# Patient Record
Sex: Female | Born: 1990 | Race: White | Hispanic: No | Marital: Married | Smoking: Former smoker
Health system: Southern US, Community
[De-identification: ages and names within clinical notes are randomized; demographics above are authoritative.]

## PROBLEM LIST (undated history)

## (undated) DIAGNOSIS — F32A Depression, unspecified: Secondary | ICD-10-CM

## (undated) DIAGNOSIS — R011 Cardiac murmur, unspecified: Secondary | ICD-10-CM

## (undated) DIAGNOSIS — R569 Unspecified convulsions: Secondary | ICD-10-CM

## (undated) DIAGNOSIS — F419 Anxiety disorder, unspecified: Secondary | ICD-10-CM

## (undated) DIAGNOSIS — K219 Gastro-esophageal reflux disease without esophagitis: Secondary | ICD-10-CM

## (undated) DIAGNOSIS — F1211 Cannabis abuse, in remission: Secondary | ICD-10-CM

## (undated) DIAGNOSIS — R51 Headache: Secondary | ICD-10-CM

## (undated) DIAGNOSIS — E559 Vitamin D deficiency, unspecified: Secondary | ICD-10-CM

## (undated) DIAGNOSIS — F329 Major depressive disorder, single episode, unspecified: Secondary | ICD-10-CM

## (undated) DIAGNOSIS — R519 Headache, unspecified: Secondary | ICD-10-CM

## (undated) DIAGNOSIS — F1191 Opioid use, unspecified, in remission: Secondary | ICD-10-CM

## (undated) DIAGNOSIS — Z720 Tobacco use: Secondary | ICD-10-CM

## (undated) HISTORY — DX: Depression, unspecified: F32.A

## (undated) HISTORY — DX: Major depressive disorder, single episode, unspecified: F32.9

## (undated) HISTORY — DX: Anxiety disorder, unspecified: F41.9

## (undated) HISTORY — DX: Vitamin D deficiency, unspecified: E55.9

## (undated) HISTORY — DX: Tobacco use: Z72.0

## (undated) HISTORY — DX: Cannabis abuse, in remission: F12.11

---
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
## 2011-07-30 HISTORY — PX: WISDOM TOOTH EXTRACTION: SHX21

## 2011-07-30 HISTORY — PX: COLON SURGERY: SHX602

## 2014-01-12 ENCOUNTER — Emergency Department: Payer: Self-pay | Admitting: Internal Medicine

## 2014-01-22 ENCOUNTER — Emergency Department: Payer: Self-pay | Admitting: Emergency Medicine

## 2014-01-31 ENCOUNTER — Emergency Department: Payer: Self-pay | Admitting: Emergency Medicine

## 2014-04-14 LAB — HM PAP SMEAR: HM Pap smear: NEGATIVE

## 2014-04-26 ENCOUNTER — Emergency Department: Payer: Self-pay | Admitting: Emergency Medicine

## 2014-04-26 LAB — COMPREHENSIVE METABOLIC PANEL
ALK PHOS: 44 U/L — AB
Albumin: 3.7 g/dL (ref 3.4–5.0)
Anion Gap: 8 (ref 7–16)
BILIRUBIN TOTAL: 0.4 mg/dL (ref 0.2–1.0)
BUN: 7 mg/dL (ref 7–18)
CALCIUM: 8.7 mg/dL (ref 8.5–10.1)
CHLORIDE: 102 mmol/L (ref 98–107)
Co2: 27 mmol/L (ref 21–32)
Creatinine: 0.62 mg/dL (ref 0.60–1.30)
Glucose: 87 mg/dL (ref 65–99)
Osmolality: 271 (ref 275–301)
Potassium: 3.4 mmol/L — ABNORMAL LOW (ref 3.5–5.1)
SGOT(AST): 21 U/L (ref 15–37)
SGPT (ALT): 26 U/L
Sodium: 137 mmol/L (ref 136–145)
Total Protein: 7.8 g/dL (ref 6.4–8.2)

## 2014-04-26 LAB — URINALYSIS, COMPLETE
BILIRUBIN, UR: NEGATIVE
BLOOD: NEGATIVE
Glucose,UR: NEGATIVE mg/dL (ref 0–75)
Leukocyte Esterase: NEGATIVE
Nitrite: NEGATIVE
PH: 6 (ref 4.5–8.0)
Protein: 30
RBC,UR: 2 /HPF (ref 0–5)
SPECIFIC GRAVITY: 1.028 (ref 1.003–1.030)
WBC UR: 2 /HPF (ref 0–5)

## 2014-04-26 LAB — CBC WITH DIFFERENTIAL/PLATELET
BASOS ABS: 0.1 10*3/uL (ref 0.0–0.1)
Basophil %: 0.5 %
EOS ABS: 0.1 10*3/uL (ref 0.0–0.7)
EOS PCT: 0.6 %
HCT: 42.4 % (ref 35.0–47.0)
HGB: 13.7 g/dL (ref 12.0–16.0)
LYMPHS ABS: 1.9 10*3/uL (ref 1.0–3.6)
Lymphocyte %: 14.1 %
MCH: 33.3 pg (ref 26.0–34.0)
MCHC: 32.2 g/dL (ref 32.0–36.0)
MCV: 103 fL — AB (ref 80–100)
MONO ABS: 1 x10 3/mm — AB (ref 0.2–0.9)
Monocyte %: 7.4 %
NEUTROS ABS: 10.2 10*3/uL — AB (ref 1.4–6.5)
Neutrophil %: 77.4 %
Platelet: 292 10*3/uL (ref 150–440)
RBC: 4.11 10*6/uL (ref 3.80–5.20)
RDW: 12.3 % (ref 11.5–14.5)
WBC: 13.1 10*3/uL — ABNORMAL HIGH (ref 3.6–11.0)

## 2014-04-26 LAB — WET PREP, GENITAL

## 2014-04-27 LAB — GC/CHLAMYDIA PROBE AMP

## 2014-04-27 LAB — HCG, QUANTITATIVE, PREGNANCY: Beta Hcg, Quant.: 67296 m[IU]/mL — ABNORMAL HIGH

## 2014-05-11 ENCOUNTER — Emergency Department: Payer: Self-pay | Admitting: Emergency Medicine

## 2014-05-11 LAB — GC/CHLAMYDIA PROBE AMP

## 2014-05-11 LAB — CBC WITH DIFFERENTIAL/PLATELET
BASOS ABS: 0.1 10*3/uL (ref 0.0–0.1)
Basophil %: 0.4 %
Eosinophil #: 0.1 10*3/uL (ref 0.0–0.7)
Eosinophil %: 0.5 %
HCT: 40.6 % (ref 35.0–47.0)
HGB: 13.2 g/dL (ref 12.0–16.0)
LYMPHS PCT: 13 %
Lymphocyte #: 1.7 10*3/uL (ref 1.0–3.6)
MCH: 33.9 pg (ref 26.0–34.0)
MCHC: 32.5 g/dL (ref 32.0–36.0)
MCV: 104 fL — ABNORMAL HIGH (ref 80–100)
MONO ABS: 0.9 x10 3/mm (ref 0.2–0.9)
Monocyte %: 6.9 %
NEUTROS ABS: 10.1 10*3/uL — AB (ref 1.4–6.5)
Neutrophil %: 79.2 %
PLATELETS: 300 10*3/uL (ref 150–440)
RBC: 3.9 10*6/uL (ref 3.80–5.20)
RDW: 12.6 % (ref 11.5–14.5)
WBC: 12.8 10*3/uL — AB (ref 3.6–11.0)

## 2014-05-11 LAB — COMPREHENSIVE METABOLIC PANEL
ALBUMIN: 3.5 g/dL (ref 3.4–5.0)
ALK PHOS: 45 U/L — AB
ANION GAP: 8 (ref 7–16)
BUN: 5 mg/dL — AB (ref 7–18)
Bilirubin,Total: 0.5 mg/dL (ref 0.2–1.0)
Calcium, Total: 8.8 mg/dL (ref 8.5–10.1)
Chloride: 104 mmol/L (ref 98–107)
Co2: 25 mmol/L (ref 21–32)
Creatinine: 0.63 mg/dL (ref 0.60–1.30)
Glucose: 80 mg/dL (ref 65–99)
Osmolality: 270 (ref 275–301)
Potassium: 3.8 mmol/L (ref 3.5–5.1)
SGOT(AST): 14 U/L — ABNORMAL LOW (ref 15–37)
SGPT (ALT): 27 U/L
Sodium: 137 mmol/L (ref 136–145)
TOTAL PROTEIN: 7.6 g/dL (ref 6.4–8.2)

## 2014-05-11 LAB — URINALYSIS, COMPLETE
BILIRUBIN, UR: NEGATIVE
Blood: NEGATIVE
GLUCOSE, UR: NEGATIVE mg/dL (ref 0–75)
LEUKOCYTE ESTERASE: NEGATIVE
NITRITE: NEGATIVE
Ph: 7 (ref 4.5–8.0)
Protein: NEGATIVE
Specific Gravity: 1.02 (ref 1.003–1.030)
Squamous Epithelial: 2
WBC UR: NONE SEEN /HPF (ref 0–5)

## 2014-05-11 LAB — HCG, QUANTITATIVE, PREGNANCY: Beta Hcg, Quant.: 48523 m[IU]/mL — ABNORMAL HIGH

## 2014-05-11 LAB — WET PREP, GENITAL

## 2014-06-16 ENCOUNTER — Emergency Department: Payer: Self-pay | Admitting: Emergency Medicine

## 2014-06-16 LAB — URINALYSIS, COMPLETE
BACTERIA: NONE SEEN
BILIRUBIN, UR: NEGATIVE
Blood: NEGATIVE
GLUCOSE, UR: NEGATIVE mg/dL (ref 0–75)
KETONE: NEGATIVE
Leukocyte Esterase: NEGATIVE
Nitrite: NEGATIVE
Ph: 8 (ref 4.5–8.0)
Protein: NEGATIVE
RBC,UR: NONE SEEN /HPF (ref 0–5)
SPECIFIC GRAVITY: 1.009 (ref 1.003–1.030)
WBC UR: <1 /HPF (ref 0–5)

## 2014-06-16 LAB — CBC WITH DIFFERENTIAL/PLATELET
Basophil #: 0 10*3/uL (ref 0.0–0.1)
Basophil %: 0.3 %
Eosinophil #: 0.1 10*3/uL (ref 0.0–0.7)
Eosinophil %: 0.9 %
HCT: 35.2 % (ref 35.0–47.0)
HGB: 11.9 g/dL — ABNORMAL LOW (ref 12.0–16.0)
Lymphocyte #: 1.5 10*3/uL (ref 1.0–3.6)
Lymphocyte %: 17.8 %
MCH: 35.4 pg — ABNORMAL HIGH (ref 26.0–34.0)
MCHC: 33.6 g/dL (ref 32.0–36.0)
MCV: 105 fL — ABNORMAL HIGH (ref 80–100)
Monocyte #: 0.7 x10 3/mm (ref 0.2–0.9)
Monocyte %: 8.2 %
Neutrophil #: 6.3 10*3/uL (ref 1.4–6.5)
Neutrophil %: 72.8 %
Platelet: 232 10*3/uL (ref 150–440)
RBC: 3.35 10*6/uL — ABNORMAL LOW (ref 3.80–5.20)
RDW: 12.7 % (ref 11.5–14.5)
WBC: 8.6 10*3/uL (ref 3.6–11.0)

## 2014-06-16 LAB — HEPATIC FUNCTION PANEL A (ARMC)
ALK PHOS: 42 U/L — AB
ALT: 32 U/L
Albumin: 2.9 g/dL — ABNORMAL LOW (ref 3.4–5.0)
BILIRUBIN TOTAL: 0.3 mg/dL (ref 0.2–1.0)
SGOT(AST): 27 U/L (ref 15–37)
Total Protein: 6.4 g/dL (ref 6.4–8.2)

## 2014-06-16 LAB — BASIC METABOLIC PANEL
Anion Gap: 6 — ABNORMAL LOW (ref 7–16)
BUN: 5 mg/dL — ABNORMAL LOW (ref 7–18)
Calcium, Total: 8 mg/dL — ABNORMAL LOW (ref 8.5–10.1)
Chloride: 107 mmol/L (ref 98–107)
Co2: 26 mmol/L (ref 21–32)
Creatinine: 0.55 mg/dL — ABNORMAL LOW (ref 0.60–1.30)
EGFR (African American): >60
EGFR (Non-African Amer.): >60
Glucose: 85 mg/dL (ref 65–99)
Osmolality: 274 (ref 275–301)
Potassium: 3.6 mmol/L (ref 3.5–5.1)
Sodium: 139 mmol/L (ref 136–145)

## 2014-06-16 LAB — HCG, QUANTITATIVE, PREGNANCY: Beta Hcg, Quant.: 7680 m[IU]/mL — ABNORMAL HIGH

## 2014-09-08 ENCOUNTER — Observation Stay: Payer: Self-pay | Admitting: Obstetrics and Gynecology

## 2014-09-20 ENCOUNTER — Observation Stay: Payer: Self-pay | Admitting: Obstetrics and Gynecology

## 2014-11-01 ENCOUNTER — Ambulatory Visit
Admit: 2014-11-01 | Disposition: A | Discharge status: Home / Self Care | Payer: Self-pay | Attending: Obstetrics and Gynecology | Admitting: Obstetrics and Gynecology

## 2014-11-08 ENCOUNTER — Observation Stay
Admit: 2014-11-08 | Disposition: A | Discharge status: Home / Self Care | Payer: Self-pay | Attending: Obstetrics and Gynecology | Admitting: Obstetrics and Gynecology

## 2014-11-11 ENCOUNTER — Inpatient Hospital Stay
Admit: 2014-11-11 | Disposition: A | Discharge status: Home / Self Care | Payer: Self-pay | Attending: Obstetrics and Gynecology | Admitting: Obstetrics and Gynecology

## 2014-11-11 LAB — CBC WITH DIFFERENTIAL/PLATELET
BASOS ABS: 0.1 10*3/uL (ref 0.0–0.1)
Basophil %: 0.3 %
EOS ABS: 0.1 10*3/uL (ref 0.0–0.7)
Eosinophil %: 0.4 %
HCT: 35 % (ref 35.0–47.0)
HGB: 11.5 g/dL — ABNORMAL LOW (ref 12.0–16.0)
LYMPHS ABS: 1.7 10*3/uL (ref 1.0–3.6)
Lymphocyte %: 10 %
MCH: 32.8 pg (ref 26.0–34.0)
MCHC: 32.9 g/dL (ref 32.0–36.0)
MCV: 100 fL (ref 80–100)
MONO ABS: 1.3 x10 3/mm — AB (ref 0.2–0.9)
Monocyte %: 8 %
NEUTROS PCT: 81.3 %
Neutrophil #: 13.7 10*3/uL — ABNORMAL HIGH (ref 1.4–6.5)
PLATELETS: 298 10*3/uL (ref 150–440)
RBC: 3.51 10*6/uL — ABNORMAL LOW (ref 3.80–5.20)
RDW: 13.2 % (ref 11.5–14.5)
WBC: 16.9 10*3/uL — ABNORMAL HIGH (ref 3.6–11.0)

## 2014-11-12 LAB — DRUG SCREEN, URINE
AMPHETAMINES, UR SCREEN: NEGATIVE
BENZODIAZEPINE, UR SCRN: NEGATIVE
Barbiturates, Ur Screen: NEGATIVE
Cannabinoid 50 Ng, Ur ~~LOC~~: POSITIVE
Cocaine Metabolite,Ur ~~LOC~~: NEGATIVE
MDMA (Ecstasy)Ur Screen: NEGATIVE
Methadone, Ur Screen: NEGATIVE
OPIATE, UR SCREEN: NEGATIVE
PHENCYCLIDINE (PCP) UR S: NEGATIVE
Tricyclic, Ur Screen: NEGATIVE

## 2014-11-12 LAB — GC/CHLAMYDIA PROBE AMP

## 2014-11-13 LAB — HEMATOCRIT: HCT: 29.1 % — ABNORMAL LOW (ref 35.0–47.0)

## 2014-12-06 NOTE — H&P (Signed)
L&D Evaluation:  History:  HPI [name] is a 24 y.o. G2P0010 at 40.1 weeks, EDD 11/10/14, by LMP 02/03/14 who presents with c/o LOF and contractions since 1:30 pm. Denies vaginal bleeding and notes good FM.   Presents with contractions, leaking fluid   Patient's Medical History Depression, Obesity in pregnancy, h/o Chlamydia and gonorrhea in early pregnancy, endometriosis, Rh neg, GBS+. SAB  x 1   Patient's Surgical History none   Medications Pre Natal Vitamins  prozac 20 mg daily (weaned off in 3rd trimster)   Allergies PCN   Social History marijuana use, former smoker (quit 02/2014)   Family History Diabetes in MGF   ROS:  General normal   HEENT normal   CNS normal   GI normal   GU leaking fluid, contractions   Resp normal   CV normal   Renal normal   MS normal   Exam:  Vital Signs stable   Urine Protein not completed   General no apparent distress   Mental Status clear   Chest clear   Heart normal sinus rhythm   Abdomen gravid, non-tender   Estimated Fetal Weight Large for gestational age, ~ 4000 grams.  (EFW 3537 g at 36.5 weeks, 93%ile)   Fetal Position cephalic   Back no CVAT   Edema no edema   Pelvic no external lesions, 3/90/-2/cephalic   Mebranes Ruptured, nitrazine positive   Description clear   FHT normal rate with no decels   Fetal Heart Rate 145   Ucx regular   Ucx Frequency 3 min   Skin dry, no lesions, no rashes   Lymph no lymphadenopathy   Other A-/-/ND/NR/VZI/Rubella unknown.  03/31/14 GC+/CL+ (treated.  Repeat 10/18/14 neg). RGS wnl (67 mg/dL). GBS + culture. 08/23/14: Hgb/Hct 11.4/32.9 11/11/14: CBC pending   Impression:  Impression early labor, with SROM   Plan:  Plan EFM/NST, monitor contractions and for cervical change, antibiotics for GBBS prophylaxis   Comments Will use Clindamycin for GBP prophylaxis, patient with PCN allg.  Desires epidural at some point in labor. Can use Stadol until epidural  desired. Will need Rhogam postpartum pending baby status.  Rubella status unknown. Will order.   Electronic Signatures: Fabian November (MD)  (Signed 17-Apr-16 14:07)  Authored: L&D Evaluation   Last Updated: 17-Apr-16 14:07 by Fabian November (MD)

## 2015-01-19 ENCOUNTER — Telehealth: Payer: Self-pay | Admitting: Obstetrics and Gynecology

## 2015-01-19 ENCOUNTER — Ambulatory Visit: Payer: Self-pay | Admitting: Obstetrics and Gynecology

## 2015-01-19 ENCOUNTER — Emergency Department
Admission: EM | Admit: 2015-01-19 | Discharge: 2015-01-19 | Discharge status: Against Medical Advice | Payer: Medicaid Other | Attending: Emergency Medicine | Admitting: Emergency Medicine

## 2015-01-19 ENCOUNTER — Encounter: Payer: Self-pay | Admitting: Emergency Medicine

## 2015-01-19 DIAGNOSIS — R109 Unspecified abdominal pain: Secondary | ICD-10-CM | POA: Diagnosis not present

## 2015-01-19 DIAGNOSIS — Z720 Tobacco use: Secondary | ICD-10-CM | POA: Diagnosis not present

## 2015-01-19 DIAGNOSIS — F32A Depression, unspecified: Secondary | ICD-10-CM | POA: Insufficient documentation

## 2015-01-19 DIAGNOSIS — F419 Anxiety disorder, unspecified: Principal | ICD-10-CM

## 2015-01-19 DIAGNOSIS — F1221 Cannabis dependence, in remission: Secondary | ICD-10-CM

## 2015-01-19 DIAGNOSIS — F329 Major depressive disorder, single episode, unspecified: Secondary | ICD-10-CM | POA: Insufficient documentation

## 2015-01-19 LAB — URINALYSIS COMPLETE WITH MICROSCOPIC (ARMC ONLY)
Bilirubin Urine: NEGATIVE
Glucose, UA: NEGATIVE mg/dL
Ketones, ur: NEGATIVE mg/dL
LEUKOCYTES UA: NEGATIVE
Nitrite: NEGATIVE
Protein, ur: NEGATIVE mg/dL
Specific Gravity, Urine: 1.021 (ref 1.005–1.030)
pH: 7 (ref 5.0–8.0)

## 2015-01-19 LAB — COMPREHENSIVE METABOLIC PANEL
ALBUMIN: 4 g/dL (ref 3.5–5.0)
ALT: 35 U/L (ref 14–54)
AST: 28 U/L (ref 15–41)
Alkaline Phosphatase: 82 U/L (ref 38–126)
Anion gap: 8 (ref 5–15)
BILIRUBIN TOTAL: 0.3 mg/dL (ref 0.3–1.2)
BUN: 6 mg/dL (ref 6–20)
CALCIUM: 9.4 mg/dL (ref 8.9–10.3)
CO2: 28 mmol/L (ref 22–32)
Chloride: 106 mmol/L (ref 101–111)
Creatinine, Ser: 0.85 mg/dL (ref 0.44–1.00)
GFR calc Af Amer: >60 mL/min (ref >60)
GFR calc non Af Amer: >60 mL/min (ref >60)
Glucose, Bld: 93 mg/dL (ref 65–99)
Potassium: 4.2 mmol/L (ref 3.5–5.1)
SODIUM: 142 mmol/L (ref 135–145)
Total Protein: 8 g/dL (ref 6.5–8.1)

## 2015-01-19 LAB — CBC WITH DIFFERENTIAL/PLATELET
BASOS ABS: 0.1 10*3/uL (ref 0–0.1)
Basophils Relative: 1 %
Eosinophils Absolute: 0.3 10*3/uL (ref 0–0.7)
Eosinophils Relative: 5 %
HCT: 40.6 % (ref 35.0–47.0)
Hemoglobin: 13.4 g/dL (ref 12.0–16.0)
Lymphocytes Relative: 27 %
Lymphs Abs: 1.9 10*3/uL (ref 1.0–3.6)
MCH: 32.2 pg (ref 26.0–34.0)
MCHC: 33 g/dL (ref 32.0–36.0)
MCV: 97.5 fL (ref 80.0–100.0)
Monocytes Absolute: 0.6 10*3/uL (ref 0.2–0.9)
Monocytes Relative: 9 %
NEUTROS PCT: 58 %
Neutro Abs: 4.2 10*3/uL (ref 1.4–6.5)
PLATELETS: 314 10*3/uL (ref 150–440)
RBC: 4.16 MIL/uL (ref 3.80–5.20)
RDW: 13.9 % (ref 11.5–14.5)
WBC: 7.1 10*3/uL (ref 3.6–11.0)

## 2015-01-19 LAB — LIPASE, BLOOD: Lipase: 42 U/L (ref 22–51)

## 2015-01-19 LAB — POCT PREGNANCY, URINE: Preg Test, Ur: NEGATIVE

## 2015-01-19 NOTE — ED Notes (Signed)
Pt reports that she had to leave because she needed to take care of her baby

## 2015-01-19 NOTE — Telephone Encounter (Signed)
Patients mother called concerrned about patients well being. Her mother stated she was taking the meds prescribed for depression 4 times a time and has ran out and is out of her mind crazy in her words. Patient had an appointment this morning and did not show up. Her Mother is wanting her to be seen for an appointment. She was informed that you are out of the office tomorrow. Her mother also stated that the patient doesn't want anything to do with the baby and is concerned to the point that she is thinking about calling DSS.

## 2015-01-19 NOTE — ED Notes (Signed)
RUQ pain for several days

## 2015-01-19 NOTE — Telephone Encounter (Signed)
Patient's mother informed multiple times to either have patient taken to the Emergency Room for evaluation, or to call 911 for emergency services.  Discussed that the she can be placed on an involuntary hold for 24 hrs at a behavioral health facility if deemed necessary by authorities.  Should not wait to schedule an appointment if patient is unstable, and her primary OB will be out of the office tomorrow.  Also given telephone numbers for behavioral health 24 hr hotline.

## 2015-01-20 ENCOUNTER — Encounter: Payer: Self-pay | Admitting: *Deleted

## 2015-01-20 ENCOUNTER — Emergency Department
Admission: EM | Admit: 2015-01-20 | Discharge: 2015-01-20 | Disposition: A | Discharge status: Home / Self Care | Payer: Medicaid Other | Attending: Emergency Medicine | Admitting: Emergency Medicine

## 2015-01-20 DIAGNOSIS — F53 Postpartum depression: Secondary | ICD-10-CM

## 2015-01-20 DIAGNOSIS — O99345 Other mental disorders complicating the puerperium: Secondary | ICD-10-CM

## 2015-01-20 DIAGNOSIS — F329 Major depressive disorder, single episode, unspecified: Secondary | ICD-10-CM | POA: Diagnosis not present

## 2015-01-20 DIAGNOSIS — Z720 Tobacco use: Secondary | ICD-10-CM | POA: Diagnosis not present

## 2015-01-20 DIAGNOSIS — Z008 Encounter for other general examination: Secondary | ICD-10-CM | POA: Diagnosis present

## 2015-01-20 DIAGNOSIS — Z880 Allergy status to penicillin: Secondary | ICD-10-CM | POA: Insufficient documentation

## 2015-01-20 LAB — URINALYSIS COMPLETE WITH MICROSCOPIC (ARMC ONLY)
Bacteria, UA: NONE SEEN
Bilirubin Urine: NEGATIVE
Glucose, UA: NEGATIVE mg/dL
HGB URINE DIPSTICK: NEGATIVE
Leukocytes, UA: NEGATIVE
NITRITE: NEGATIVE
PH: 6 (ref 5.0–8.0)
PROTEIN: 30 mg/dL — AB
SPECIFIC GRAVITY, URINE: 1.027 (ref 1.005–1.030)

## 2015-01-20 LAB — URINE DRUG SCREEN, QUALITATIVE (ARMC ONLY)
Amphetamines, Ur Screen: NOT DETECTED
Barbiturates, Ur Screen: NOT DETECTED
Benzodiazepine, Ur Scrn: NOT DETECTED
COCAINE METABOLITE, UR ~~LOC~~: NOT DETECTED
Cannabinoid 50 Ng, Ur ~~LOC~~: POSITIVE — AB
MDMA (ECSTASY) UR SCREEN: NOT DETECTED
Methadone Scn, Ur: NOT DETECTED
Opiate, Ur Screen: NOT DETECTED
Phencyclidine (PCP) Ur S: NOT DETECTED
Tricyclic, Ur Screen: NOT DETECTED

## 2015-01-20 MED ORDER — SERTRALINE HCL 50 MG PO TABS: 50.0000 mg | ORAL_TABLET | Freq: Every day | ORAL | Status: DC

## 2015-01-20 NOTE — Discharge Instructions (Signed)
You appear to have good insight to your depression and mood swings. You have no suicidal thoughts or thoughts of hurting others, including your baby. You have good family support. Please follow-up with [facility]. He may restart Zoloft at one tablet per day. The prescription provided today with last 2 weeks.  Return to the emergency department if you have any thoughts of self-harm or harm towards others, or if you have any other urgent concerns.  Postpartum Depression and Baby Blues The postpartum period begins right after the birth of a baby. During this time, there is often a great amount of joy and excitement. It is also a time of many changes in the life of the parents. Regardless of how many times a mother gives birth, each child brings new challenges and dynamics to the family. It is not unusual to have feelings of excitement along with confusing shifts in moods, emotions, and thoughts. All mothers are at risk of developing postpartum depression or the "baby blues." These mood changes can occur right after giving birth, or they may occur many months after giving birth. The baby blues or postpartum depression can be mild or severe. Additionally, postpartum depression can go away rather quickly, or it can be a long-term condition.  CAUSES Raised hormone levels and the rapid drop in those levels are thought to be a main cause of postpartum depression and the baby blues. A number of hormones change during and after pregnancy. Estrogen and progesterone usually decrease right after the delivery of your baby. The levels of thyroid hormone and various cortisol steroids also rapidly drop. Other factors that play a role in these mood changes include major life events and genetics.  RISK FACTORS If you have any of the following risks for the baby blues or postpartum depression, know what symptoms to watch out for during the postpartum period. Risk factors that may increase the likelihood of getting the baby blues or  postpartum depression include:  Having a personal or family history of depression.   Having depression while being pregnant.   Having premenstrual mood issues or mood issues related to oral contraceptives.  Having a lot of life stress.   Having marital conflict.   Lacking a social support network.   Having a baby with special needs.   Having health problems, such as diabetes.  SIGNS AND SYMPTOMS Symptoms of baby blues include:  Brief changes in mood, such as going from extreme happiness to sadness.  Decreased concentration.   Difficulty sleeping.   Crying spells, tearfulness.   Irritability.   Anxiety.  Symptoms of postpartum depression typically begin within the first month after giving birth. These symptoms include:  Difficulty sleeping or excessive sleepiness.   Marked weight loss.   Agitation.   Feelings of worthlessness.   Lack of interest in activity or food.  Postpartum psychosis is a very serious condition and can be dangerous. Fortunately, it is rare. Displaying any of the following symptoms is cause for immediate medical attention. Symptoms of postpartum psychosis include:   Hallucinations and delusions.   Bizarre or disorganized behavior.   Confusion or disorientation.  DIAGNOSIS  A diagnosis is made by an evaluation of your symptoms. There are no medical or lab tests that lead to a diagnosis, but there are various questionnaires that a health care provider may use to identify those with the baby blues, postpartum depression, or psychosis. Often, a screening tool called the [place] Postnatal Depression Scale is used to diagnose depression in the postpartum period.  TREATMENT The baby blues usually goes away on its own in 1-2 weeks. Social support is often all that is needed. You will be encouraged to get adequate sleep and rest. Occasionally, you may be given medicines to help you sleep.  Postpartum depression requires treatment  because it can last several months or longer if it is not treated. Treatment may include individual or group therapy, medicine, or both to address any social, physiological, and psychological factors that may play a role in the depression. Regular exercise, a healthy diet, rest, and social support may also be strongly recommended.  Postpartum psychosis is more serious and needs treatment right away. Hospitalization is often needed. HOME CARE INSTRUCTIONS  Get as much rest as you can. Nap when the baby sleeps.   Exercise regularly. Some women find yoga and walking to be beneficial.   Eat a balanced and nourishing diet.   Do little things that you enjoy. Have a cup of tea, take a bubble bath, read your favorite magazine, or listen to your favorite music.  Avoid alcohol.   Ask for help with household chores, cooking, grocery shopping, or running errands as needed. Do not try to do everything.   Talk to people close to you about how you are feeling. Get support from your partner, family members, friends, or other new moms.  Try to stay positive in how you think. Think about the things you are grateful for.   Do not spend a lot of time alone.   Only take over-the-counter or prescription medicine as directed by your health care provider.  Keep all your postpartum appointments.   Let your health care provider know if you have any concerns.  SEEK MEDICAL CARE IF: You are having a reaction to or problems with your medicine. SEEK IMMEDIATE MEDICAL CARE IF:  You have suicidal feelings.   You think you may harm the baby or someone else. MAKE SURE YOU:  Understand these instructions.  Will watch your condition.  Will get help right away if you are not doing well or get worse. Document Released: 04/18/2004 Document Revised: 07/20/2013 Document Reviewed: 04/26/2013 [facility] [employer] Patient Information 2015 [place], [place]. This information is not intended to replace advice given to you  by your health care provider. Make sure you discuss any questions you have with your health care provider.

## 2015-01-20 NOTE — ED Notes (Signed)

## 2015-01-20 NOTE — ED Notes (Signed)
Pt is 3 months post partum and states that she has depression and mood swings and she feels that it is "really bad".  Pt denies any thoughts of harming herself, her baby or anyone else but feels that her "mood swings are affecting her relationship" and also anxiety and sadness about her fear of her boyfriend leaving. Pt is calm and cooperative

## 2015-01-20 NOTE — ED Provider Notes (Signed)
[facility] Emergency Department Provider Note  ____________________________________________  Time seen: 1515  I have reviewed the triage vital signs and the nursing notes.   HISTORY  Chief Complaint Medical Clearance  depression, postpartum    HPI Kendra [name] is a 24 y.o. female who had a child 3 months ago. Vaginal delivery, no significant complications. She presents today reporting that she has had postpartum depression. This began 2 weeks after the delivery. She has had some mood swings and is arguing more with her boyfriend. She has seen her obstetrician about this. She was started on Zoloft for this condition at her 6 week postpartum checkup. She escalated the dose quicker than what was advised and has been taking 2 pills a day. She has now run out of the medicine one week ago. Since then she feels she is arguing with her boyfriend even more. She presents to the emergency department with her mother supporting her and present in the room. She denies any suicidal thoughts or thoughts about hurting others, including the baby. She is feeling that the mood swings and depression is affecting her relationship with her boyfriend and is at threat and thus she is seeking additional care now.     Past Medical History  Diagnosis Date  . Anxiety   . Depression   . Tobacco abuse   . History of cannabis abuse     Patient Active Problem List   Diagnosis Date Noted  . Anxiety and depression 01/19/2015  . History of cannabis dependence/abuse 01/19/2015  . Tobacco abuse 01/19/2015    Past Surgical History  Procedure Laterality Date  . Colon surgery  2013  . Wisdom tooth extraction  2013    Current Outpatient Rx  Name  Route  Sig  Dispense  Refill  . sertraline (ZOLOFT) 50 MG tablet   Oral   Take 1 tablet (50 mg total) by mouth daily.   14 tablet   0     Allergies Penicillins  Family History  Problem Relation Age of Onset  . Diabetes Maternal  Grandfather     Social History History  Substance Use Topics  . Smoking status: Current Every Day Smoker -- 1.00 packs/day    Types: Cigarettes  . Smokeless tobacco: Not on file  . Alcohol Use: No    Review of Systems  Constitutional: Negative for fever. ENT: Negative for sore throat. Cardiovascular: Negative for chest pain. Respiratory: Negative for shortness of breath. Gastrointestinal: Patient presents to the emergency department yesterday with right-sided abdominal pain. It occurred yesterday only and has resolved. Genitourinary: Negative for dysuria. Musculoskeletal: No myalgias or injuries. Skin: Negative for rash. Neurological: Negative for headaches Psychiatric: Depression, mood swings. See history of present illness  10-point ROS otherwise negative.  ____________________________________________   PHYSICAL EXAM:  VITAL SIGNS: ED Triage Vitals  Enc Vitals Group     BP 01/20/15 1445 149/102 mmHg     Pulse Rate 01/20/15 1445 79     Resp 01/20/15 1445 20     Temp 01/20/15 1445 98.2 F (36.8 C)     Temp Source 01/20/15 1445 Oral     SpO2 01/20/15 1445 98 %     Weight 01/20/15 1445 203 lb (92.08 kg)     Height 01/20/15 1445  (1.702 m)     Head Cir --      Peak Flow --      Pain Score 01/20/15 1447 0     Pain Loc --  Pain Edu? --      Excl. in GC? --     Constitutional: Alert and oriented. Well appearing and in no distress. ENT   Head: Normocephalic and atraumatic.   Nose: No congestion/rhinnorhea.   Mouth/Throat: Mucous membranes are moist.  Note: Multiple facial piercings and dilation of both earlobes. Cardiovascular: Normal rate, regular rhythm, no murmur noted Respiratory:  Normal respiratory effort, no tachypnea.    Breath sounds are clear and equal bilaterally.  Gastrointestinal: Soft and nontender. No distention. In particular, no tenderness in the right abdomen where she had pain yesterday. No CVA tenderness. Back: No muscle  spasm, no tenderness. Musculoskeletal: No deformity noted. Nontender with normal range of motion in all extremities.  No noted edema. Neurologic:  Normal speech and language. No gross focal neurologic deficits are appreciated.  Skin:  Skin is warm, dry. No rash noted. Psychiatric: Pleasant, communicative, intact thought process. No suicidal thought, no thoughts of hurting others including her child. Mood swings reported but none noted during our interview. ____________________________________________    LABS (pertinent positives/negatives)  Labs from yesterday's visit were reviewed. Normal white blood cell count and hemoglobin, normal metabolic panel.  ____________________________________________   INITIAL IMPRESSION / ASSESSMENT AND PLAN / ED COURSE  Pleasant, communicative 24 year old female with good family support and access to additional resources. No suicidal thoughts, no urgent psychiatric need identified.   Without any urgent need for threat, this patient is best served by being released from the emergency department and pursuing outpatient treatment. I discussed this with her and her mother and they are comfortable with this plan. I have called and spoke with Dr. Toni Amend, psychiatry, and he agrees with the plan as well as.  The patient would like a refill on her Zoloft which I will provide.  ____________________________________________   FINAL CLINICAL IMPRESSION(S) / ED DIAGNOSES  Final diagnoses:  Postpartum depression      Darien Ramus, MD 01/20/15 1546

## 2015-02-07 ENCOUNTER — Other Ambulatory Visit: Payer: Self-pay

## 2015-02-07 DIAGNOSIS — F32A Depression, unspecified: Secondary | ICD-10-CM

## 2015-02-07 DIAGNOSIS — F329 Major depressive disorder, single episode, unspecified: Secondary | ICD-10-CM

## 2015-02-07 DIAGNOSIS — F419 Anxiety disorder, unspecified: Principal | ICD-10-CM

## 2015-02-07 MED ORDER — SERTRALINE HCL 100 MG PO TABS: 100.0000 mg | ORAL_TABLET | Freq: Every day | ORAL | Status: DC

## 2015-02-07 NOTE — Telephone Encounter (Signed)
Per Dr.Cherry refill sent in for pt for 100mg  daily. 6 refills.

## 2015-12-04 ENCOUNTER — Emergency Department
Admission: EM | Admit: 2015-12-04 | Discharge: 2015-12-05 | Disposition: A | Discharge status: Home / Self Care | Payer: Self-pay | Attending: Emergency Medicine | Admitting: Emergency Medicine

## 2015-12-04 ENCOUNTER — Encounter: Payer: Self-pay | Admitting: Emergency Medicine

## 2015-12-04 ENCOUNTER — Emergency Department: Payer: Self-pay

## 2015-12-04 DIAGNOSIS — R55 Syncope and collapse: Secondary | ICD-10-CM

## 2015-12-04 DIAGNOSIS — R1030 Lower abdominal pain, unspecified: Secondary | ICD-10-CM

## 2015-12-04 DIAGNOSIS — F418 Other specified anxiety disorders: Secondary | ICD-10-CM | POA: Insufficient documentation

## 2015-12-04 DIAGNOSIS — F1210 Cannabis abuse, uncomplicated: Secondary | ICD-10-CM | POA: Insufficient documentation

## 2015-12-04 DIAGNOSIS — K529 Noninfective gastroenteritis and colitis, unspecified: Secondary | ICD-10-CM

## 2015-12-04 DIAGNOSIS — Z880 Allergy status to penicillin: Secondary | ICD-10-CM | POA: Insufficient documentation

## 2015-12-04 DIAGNOSIS — Z79899 Other long term (current) drug therapy: Secondary | ICD-10-CM | POA: Insufficient documentation

## 2015-12-04 DIAGNOSIS — F17210 Nicotine dependence, cigarettes, uncomplicated: Secondary | ICD-10-CM | POA: Insufficient documentation

## 2015-12-04 LAB — URINALYSIS COMPLETE WITH MICROSCOPIC (ARMC ONLY)
BILIRUBIN URINE: NEGATIVE
Bacteria, UA: NONE SEEN
Glucose, UA: NEGATIVE mg/dL
Hgb urine dipstick: NEGATIVE
Nitrite: NEGATIVE
PH: 5 (ref 5.0–8.0)
PROTEIN: 30 mg/dL — AB
Specific Gravity, Urine: 1.028 (ref 1.005–1.030)

## 2015-12-04 LAB — CBC WITH DIFFERENTIAL/PLATELET
BASOS ABS: 0 10*3/uL (ref 0–0.1)
Basophils Relative: 1 %
EOS ABS: 0 10*3/uL (ref 0–0.7)
Eosinophils Relative: 0 %
HEMATOCRIT: 37 % (ref 35.0–47.0)
Hemoglobin: 12.5 g/dL (ref 12.0–16.0)
LYMPHS ABS: 2.5 10*3/uL (ref 1.0–3.6)
Lymphocytes Relative: 24 %
MCH: 33.4 pg (ref 26.0–34.0)
MCHC: 33.8 g/dL (ref 32.0–36.0)
MCV: 98.9 fL (ref 80.0–100.0)
Monocytes Absolute: 0.8 10*3/uL (ref 0.2–0.9)
Monocytes Relative: 8 %
NEUTROS ABS: 6.8 10*3/uL — AB (ref 1.4–6.5)
PLATELETS: 248 10*3/uL (ref 150–440)
RBC: 3.75 MIL/uL — ABNORMAL LOW (ref 3.80–5.20)
RDW: 12.9 % (ref 11.5–14.5)
WBC: 10.2 10*3/uL (ref 3.6–11.0)

## 2015-12-04 LAB — COMPREHENSIVE METABOLIC PANEL
ALBUMIN: 4.6 g/dL (ref 3.5–5.0)
ALK PHOS: 54 U/L (ref 38–126)
ALT: 13 U/L — ABNORMAL LOW (ref 14–54)
ANION GAP: 11 (ref 5–15)
AST: 18 U/L (ref 15–41)
BUN: 7 mg/dL (ref 6–20)
CO2: 21 mmol/L — ABNORMAL LOW (ref 22–32)
CREATININE: 0.79 mg/dL (ref 0.44–1.00)
Calcium: 9.8 mg/dL (ref 8.9–10.3)
Chloride: 107 mmol/L (ref 101–111)
GFR calc Af Amer: >60 mL/min (ref >60)
GFR calc non Af Amer: >60 mL/min (ref >60)
GLUCOSE: 93 mg/dL (ref 65–99)
Potassium: 3.8 mmol/L (ref 3.5–5.1)
SODIUM: 139 mmol/L (ref 135–145)
Total Bilirubin: 0.9 mg/dL (ref 0.3–1.2)
Total Protein: 7.7 g/dL (ref 6.5–8.1)

## 2015-12-04 LAB — POCT PREGNANCY, URINE: Preg Test, Ur: NEGATIVE

## 2015-12-04 LAB — LIPASE, BLOOD: Lipase: 15 U/L (ref 11–51)

## 2015-12-04 MED ORDER — DIATRIZOATE MEGLUMINE & SODIUM 66-10 % PO SOLN
15.0000 mL | Freq: Once | ORAL | Status: AC
  Administered 2015-12-04: 15 mL via ORAL

## 2015-12-04 MED ORDER — SODIUM CHLORIDE 0.9 % IV BOLUS (SEPSIS)
1000.0000 mL | INTRAVENOUS | Status: AC
  Administered 2015-12-04: 1000 mL via INTRAVENOUS

## 2015-12-04 MED ORDER — ONDANSETRON HCL 4 MG/2ML IJ SOLN
4.0000 mg | INTRAMUSCULAR | Status: AC
  Administered 2015-12-04: 4 mg via INTRAVENOUS
  Filled 2015-12-04: qty 2

## 2015-12-04 MED ORDER — DICYCLOMINE HCL 10 MG PO CAPS: 10.0000 mg | ORAL_CAPSULE | Freq: Four times a day (QID) | ORAL | Status: DC

## 2015-12-04 MED ORDER — DICYCLOMINE HCL 10 MG PO CAPS
10.0000 mg | ORAL_CAPSULE | Freq: Once | ORAL | Status: DC
  Filled 2015-12-04: qty 1

## 2015-12-04 MED ORDER — IOPAMIDOL (ISOVUE-300) INJECTION 61%
100.0000 mL | Freq: Once | INTRAVENOUS | Status: AC | PRN
  Administered 2015-12-04: 100 mL via INTRAVENOUS

## 2015-12-04 MED ORDER — MORPHINE SULFATE (PF) 4 MG/ML IV SOLN
4.0000 mg | Freq: Once | INTRAVENOUS | Status: AC
  Administered 2015-12-04: 4 mg via INTRAVENOUS
  Filled 2015-12-04: qty 1

## 2015-12-04 NOTE — ED Provider Notes (Addendum)
[facility] Emergency Department Provider Note  ____________________________________________  Time seen: Approximately 9:22 PM  I have reviewed the triage vital signs and the nursing notes.   HISTORY  Chief Complaint Loss of Consciousness; Abdominal Pain; and Diarrhea    HPI Kendra [name] is a 25 y.o. female who presents after a syncopal episode at home today.  She reports that she went to the bathroom to try to have a bowel movement and just as she was finishing she passed out.  She did not strike her head and does not currently have a headache or neck pain.She has passed out in the past under similar circumstances.  She states that she has been having trouble with intermittent lower abdominal cramps and diarrhea (which she describes as "mucous-y").  These have been most notable over the last couple of weeks.  They are similar to what she experienced several years ago when she went to see a gastroenterologist in [place] before she moved to this area.  She reports that she had a colonoscopy at that time and had several polyps removed.  She reports that the symptoms that she has been developing gradually over the last couple of weeks feels similar to what she was feeling at that time.  She has not had any nausea nor vomiting and also denies fever and chills as well as chest pain and shortness of breath.  She reports that the abdominal symptoms come and go, the diarrhea has been more or less consistent over the last couple weeks but she also has a feeling of bloating associated is not completely emptying her bowels.  She also reports that the stool is occasionally dark and she worries that she might have a GI bleed.  Overall she describes the symptoms as moderate.   Past Medical History  Diagnosis Date  . Anxiety   . Depression   . Tobacco abuse   . History of cannabis abuse     Patient Active Problem List   Diagnosis Date Noted  . Anxiety and  depression 01/19/2015  . History of cannabis dependence/abuse (HCC) 01/19/2015  . Tobacco abuse 01/19/2015    Past Surgical History  Procedure Laterality Date  . Colon surgery  2013  . Wisdom tooth extraction  2013    Current Outpatient Rx  Name  Route  Sig  Dispense  Refill  . sertraline (ZOLOFT) 100 MG tablet   Oral   Take 1 tablet (100 mg total) by mouth daily.   30 tablet   6     Allergies Penicillins  Family History  Problem Relation Age of Onset  . Diabetes Maternal Grandfather     Social History Social History  Substance Use Topics  . Smoking status: Current Every Day Smoker -- 1.00 packs/day    Types: Cigarettes  . Smokeless tobacco: None  . Alcohol Use: No    Review of Systems Constitutional: No fever/chills Eyes: No visual changes. ENT: No sore throat. Cardiovascular: Denies chest pain.  Syncopal episode after using the toilet tonight Respiratory: Denies shortness of breath. Gastrointestinal: lower abd cramping, diarrhea (several weeks) Genitourinary: Negative for dysuria. Musculoskeletal: Negative for back pain. Skin: Negative for rash. Neurological: Negative for headaches, focal weakness or numbness.    10-point ROS otherwise negative.  ____________________________________________   PHYSICAL EXAM:  VITAL SIGNS: ED Triage Vitals  Enc Vitals Group     BP 12/04/15 1930 114/93 mmHg     Pulse Rate 12/04/15 1930 91     Resp  12/04/15 1930 18     Temp 12/04/15 1930 97.8 F (36.6 C)     Temp Source 12/04/15 1930 Oral     SpO2 12/04/15 1930 98 %     Weight 12/04/15 1930 180 lb (81.647 kg)     Height 12/04/15 1930  (1.702 m)     Head Cir --      Peak Flow --      Pain Score 12/04/15 1930 7     Pain Loc --      Pain Edu? --      Excl. in GC? --     Constitutional: Alert and oriented. Well appearing and in no acute distress. Eyes: Conjunctivae are normal. PERRL. EOMI. Head: Atraumatic. Nose: No congestion/rhinnorhea. Mouth/Throat:  Mucous membranes are moist.  Oropharynx non-erythematous. Neck: No stridor.  No meningeal signs.   Cardiovascular: Normal rate, regular rhythm. Good peripheral circulation. Grossly normal heart sounds.   Respiratory: Normal respiratory effort.  No retractions. Lungs CTAB. Gastrointestinal: Soft and nontender. No distention.  Rectal:  Performed with nurse chaperone presents.  Normal external exam.  Non-tender.  Minimal light brown stool, heme negative, quality control passed. Musculoskeletal: No lower extremity tenderness nor edema. No gross deformities of extremities. Neurologic:  Normal speech and language. No gross focal neurologic deficits are appreciated.  Skin:  Skin is warm, dry and intact. No rash noted. Psychiatric: Mood and affect are normal. Speech and behavior are normal.  ____________________________________________   LABS (all labs ordered are listed, but only abnormal results are displayed)  Labs Reviewed  COMPREHENSIVE METABOLIC PANEL - Abnormal; Notable for the following:    CO2 21 (*)    ALT 13 (*)    All other components within normal limits  URINALYSIS COMPLETEWITH MICROSCOPIC ([facility] ONLY) - Abnormal; Notable for the following:    Color, Urine AMBER (*)    APPearance CLOUDY (*)    Ketones, ur 1+ (*)    Protein, ur 30 (*)    Leukocytes, UA TRACE (*)    Squamous Epithelial / LPF TOO NUMEROUS TO COUNT (*)    All other components within normal limits  CBC WITH DIFFERENTIAL/PLATELET - Abnormal; Notable for the following:    RBC 3.75 (*)    Neutro Abs 6.8 (*)    All other components within normal limits  LIPASE, BLOOD  POC URINE PREG, ED  POCT PREGNANCY, URINE   ____________________________________________  EKG  None ____________________________________________  RADIOLOGY   No results found.  ____________________________________________   PROCEDURES  Procedure(s) performed: None  Critical Care performed:  No ____________________________________________   INITIAL IMPRESSION / ASSESSMENT AND PLAN / ED COURSE  Pertinent labs & imaging results that were available during my care of the patient were reviewed by me and considered in my medical decision making (see chart for details).  Low risk by [facility] syncope rule, likely vasovagal in the setting of mild volume depletion.  NAD, no current pain nor tenderness.  Workup proceeding, but will rule out acute emergent conditions and try to help arrange GI follow up given GI history and symptoms that suggest IBS vs IBD.  No indication for imaging at this time.  ----------------------------------------- 11:07 PM on 12/04/2015 -----------------------------------------  I was preparing to discharge the patient but now she reports that she is having some sharp lower abdominal pain that was not present previously.  Her abdomen remains soft but now she is reporting some mild tenderness as well.  I think she is mostly concerned about her symptoms and  the plan for outpatient follow up, but when I encouraged her to try Bentyl and outpatient follow up she became more anxious and concerned.  I will provide morphine and zofran and proceed with a CT scan abd/pelvis with PO + IV contrast to further evaluate and rule out acute causes of her constellation of symptoms.  I spoke with Dr. Servando Snare by phone to try and arrange outpatient follow up.  He stated that he is booked out for at least three months but that I should provide the number to the clinic.  I also encouraged the patient to call the number for PCP arrangement.  Transferring ED care to Dr. Zenda Alpers to follow up and reassess.  Anticipate outpatient follow up.  ____________________________________________  FINAL CLINICAL IMPRESSION(S) / ED DIAGNOSES  Final diagnoses:  Vasovagal syncope  Chronic diarrhea     MEDICATIONS GIVEN DURING THIS VISIT:  Medications  sodium chloride 0.9 % bolus 1,000 mL (0 mLs  Intravenous Stopped 12/04/15 2244)  morphine 4 MG/ML injection 4 mg (4 mg Intravenous Given 12/04/15 2247)  ondansetron (ZOFRAN) injection 4 mg (4 mg Intravenous Given 12/04/15 2247)  diatrizoate meglumine-sodium (GASTROGRAFIN) 66-10 % solution 15 mL (15 mLs Oral Given 12/04/15 2252)     NEW OUTPATIENT MEDICATIONS STARTED DURING THIS VISIT:  New Prescriptions   No medications on file      Note:  This document was prepared using Dragon voice recognition software and may include unintentional dictation errors.   Loleta Rose, MD 12/05/15 1610  Loleta Rose, MD 12/05/15 [phone]

## 2015-12-04 NOTE — ED Notes (Addendum)
Pt to triage via w/c with no distress noted; reports syncopal episode PTA after sudden onset dizziness; st last week has had diarrhea and lower abd pain; st hx colon polys

## 2015-12-04 NOTE — Discharge Instructions (Signed)
You have been seen in the Emergency Department (ED) for abdominal pain, diarrhea, and syncope (passing out).  Your evaluation did not identify a clear cause of your symptoms but was generally reassuring.  Please follow up as instructed above regarding todays emergent visit and the symptoms that are bothering you.  It is important that you drink plenty of non-alcoholic fluids.  Return to the ED if your abdominal pain worsens or fails to improve, you develop bloody vomiting, bloody diarrhea, you are unable to tolerate fluids due to vomiting, fever greater than 101, or other symptoms that concern you.   Please call your regular doctor as soon as possible to schedule the next available clinic appointment to follow up with him/her regarding your visit to the ED and your symptoms.  Return to the Emergency Department (ED)  if you have any further syncopal episodes (pass out again) or develop ANY chest pain, pressure, tightness, trouble breathing, sudden sweating, or other symptoms that concern you.   Abdominal Pain, Adult Many things can cause abdominal pain. Usually, abdominal pain is not caused by a disease and will improve without treatment. It can often be observed and treated at home. Your health care provider will do a physical exam and possibly order blood tests and X-rays to help determine the seriousness of your pain. However, in many cases, more time must pass before a clear cause of the pain can be found. Before that point, your health care provider may not know if you need more testing or further treatment. HOME CARE INSTRUCTIONS Monitor your abdominal pain for any changes. The following actions may help to alleviate any discomfort you are experiencing:  Only take over-the-counter or prescription medicines as directed by your health care provider.  Do not take laxatives unless directed to do so by your health care provider.  Try a clear liquid diet (broth, tea, or water) as directed by your  health care provider. Slowly move to a bland diet as tolerated. SEEK MEDICAL CARE IF:  You have unexplained abdominal pain.  You have abdominal pain associated with nausea or diarrhea.  You have pain when you urinate or have a bowel movement.  You experience abdominal pain that wakes you in the night.  You have abdominal pain that is worsened or improved by eating food.  You have abdominal pain that is worsened with eating fatty foods.  You have a fever. SEEK IMMEDIATE MEDICAL CARE IF:  Your pain does not go away within 2 hours.  You keep throwing up (vomiting).  Your pain is felt only in portions of the abdomen, such as the right side or the left lower portion of the abdomen.  You pass bloody or black tarry stools. MAKE SURE YOU:  Understand these instructions.  Will watch your condition.  Will get help right away if you are not doing well or get worse.   This information is not intended to replace advice given to you by your health care provider. Make sure you discuss any questions you have with your health care provider.   Document Released: 04/24/2005 Document Revised: 04/05/2015 Document Reviewed: 03/24/2013 Elsevier Interactive Patient Education 2016 [employer].  Syncope, commonly known as fainting, is a temporary loss of consciousness. It occurs when the blood flow to the brain is reduced. Vasovagal syncope (also called neurocardiogenic syncope) is a fainting spell in which the blood flow to the brain is reduced because of a sudden drop in heart rate and blood pressure. Vasovagal syncope occurs when the  brain and the cardiovascular system (blood vessels) do not adequately communicate and respond to each other. This is the most common cause of fainting. It often occurs in response to fear or some other type of emotional or physical stress. The body has a reaction in which the heart starts beating too slowly or the blood vessels expand, reducing blood pressure. This type  of fainting spell is generally considered harmless. However, injuries can occur if a person takes a sudden fall during a fainting spell.  CAUSES  Vasovagal syncope occurs when a person's blood pressure and heart rate decrease suddenly, usually in response to a trigger. Many things and situations can trigger an episode. Some of these include:   Pain.   Fear.   The sight of blood or medical procedures, such as blood being drawn from a vein.   Common activities, such as coughing, swallowing, stretching, or going to the bathroom.   Emotional stress.   Prolonged standing, especially in a warm environment.   Lack of sleep or rest.   Prolonged lack of food.   Prolonged lack of fluids.   Recent illness.  The use of certain drugs that affect blood pressure, such as cocaine, alcohol, marijuana, inhalants, and opiates.  SYMPTOMS  Before the fainting episode, you may:   Feel dizzy or light headed.   Become pale.  Sense that you are going to faint.   Feel like the room is spinning.   Have tunnel vision, only seeing directly in front of you.   Feel sick to your stomach (nauseous).   See spots or slowly lose vision.   Hear ringing in your ears.   Have a headache.   Feel warm and sweaty.   Feel a sensation of pins and needles. During the fainting spell, you will generally be unconscious for no longer than a couple minutes before waking up and returning to normal. If you get up too quickly before your body can recover, you may faint again. Some twitching or jerky movements may occur during the fainting spell.  DIAGNOSIS  Your health care provider will ask about your symptoms, take a medical history, and perform a physical exam. Various tests may be done to rule out other causes of fainting. These may include blood tests and tests to check the heart, such as electrocardiography, echocardiography, and possibly an electrophysiology study. When other causes have been  ruled out, a test may be done to check the body's response to changes in position (tilt table test). TREATMENT  Most cases of vasovagal syncope do not require treatment. Your health care provider may recommend ways to avoid fainting triggers and may provide home strategies for preventing fainting. If you must be exposed to a possible trigger, you can drink additional fluids to help reduce your chances of having an episode of vasovagal syncope. If you have warning signs of an oncoming episode, you can respond by positioning yourself favorably (lying down). If your fainting spells continue, you may be given medicines to prevent fainting. Some medicines may help make you more resistant to repeated episodes of vasovagal syncope. Special exercises or compression stockings may be recommended. In rare cases, the surgical placement of a pacemaker is considered. HOME CARE INSTRUCTIONS   Learn to identify the warning signs of vasovagal syncope.   Sit or lie down at the first warning sign of a fainting spell. If sitting, put your head down between your legs. If you lie down, swing your legs up in the air to increase  blood flow to the brain.   Avoid hot tubs and saunas.  Avoid prolonged standing.  Drink enough fluids to keep your urine clear or pale yellow. Avoid caffeine.  Increase salt in your diet as directed by your health care provider.   If you have to stand for a long time, perform movements such as:   Crossing your legs.   Flexing and stretching your leg muscles.   Squatting.   Moving your legs.   Bending over.   Only take over-the-counter or prescription medicines as directed by your health care provider. Do not suddenly stop any medicines without asking your health care provider first. SEEK MEDICAL CARE IF:   Your fainting spells continue or happen more frequently in spite of treatment.   You lose consciousness for more than a couple minutes.  You have fainting spells  during or after exercising or after being startled.   You have new symptoms that occur with the fainting spells, such as:   Shortness of breath.  Chest pain.   Irregular heartbeat.   You have episodes of twitching or jerky movements that last longer than a few seconds.  You have episodes of twitching or jerky movements without obvious fainting. SEEK IMMEDIATE MEDICAL CARE IF:   You have injuries or bleeding after a fainting spell.   You have episodes of twitching or jerky movements that last longer than 5 minutes.   You have more than one spell of twitching or jerky movements before returning to consciousness after fainting.   This information is not intended to replace advice given to you by your health care provider. Make sure you discuss any questions you have with your health care provider.   Document Released: 07/01/2012 Document Revised: 11/29/2014 Document Reviewed: 07/01/2012 Elsevier Interactive Patient Education 2016 [employer]  Chronic Diarrhea Diarrhea is frequent loose and watery bowel movements. It can cause you to feel weak and dehydrated. Dehydration can cause you to become tired and thirsty and to have a dry mouth, decreased urination, and dark yellow urine. Diarrhea is a sign of another problem, most often an infection that will not last long. In most cases, diarrhea lasts 2-3 days. Diarrhea that lasts longer than 4 weeks is called long-lasting (chronic) diarrhea. It is important to treat your diarrhea as directed by your health care provider to lessen or prevent future episodes of diarrhea.  CAUSES  There are many causes of chronic diarrhea. The following are some possible causes:   Gastrointestinal infections caused by viruses, bacteria, or parasites.   Food poisoning or food allergies.   Certain medicines, such as antibiotics, chemotherapy, and laxatives.   Artificial sweeteners and fructose.   Digestive disorders, such as celiac disease and  inflammatory bowel diseases.   Irritable bowel syndrome.  Some disorders of the pancreas.  Disorders of the thyroid.  Reduced blood flow to the intestines.  Cancer. Sometimes the cause of chronic diarrhea is unknown. RISK FACTORS  Having a severely weakened immune system, such as from HIV or AIDS.   Taking certain types of cancer-fighting drugs (such as with chemotherapy) or other medicines.   Having had a recent organ transplant.   Having a portion of the stomach or small bowel removed.   Traveling to countries where food and water supplies are often contaminated.  SYMPTOMS  In addition to frequent, loose stools, diarrhea may cause:   Cramping.   Abdominal pain.   Nausea.   Fever.  Fatigue.  Urgent need to use the bathroom.  Loss of bowel control. DIAGNOSIS  Your health care provider must take a careful history and perform a physical exam. Tests given are based on your symptoms and history. Tests may include:   Blood or stool tests. Three or more stool samples may be examined. Stool cultures may be used to test for bacteria or parasites.   X-rays.   A procedure in which a thin tube is inserted into the mouth or rectum (endoscopy). This allows the health care provider to look inside the intestine.  TREATMENT   Treatment is aimed at correcting the cause of the diarrhea when possible.  Diarrhea caused by an infection can often be treated with antibiotic medicines.  Diarrhea not caused by an infection may require you to take long-term medicine or have surgery. Specific treatment should be discussed with your health care provider.  If the cause cannot be determined, treatment aims to relieve symptoms and prevent dehydration. Serious health problems can occur if you do not maintain proper fluid levels. Treatment may include:  Taking an oral rehydration solution (ORS).  Not drinking beverages that contain caffeine (such as tea, coffee, and soft  drinks).  Not drinking alcohol.  Maintaining well-balanced nutrition to help you recover faster. HOME CARE INSTRUCTIONS   Drink enough fluids to keep urine clear or pale yellow. Drink 1 cup (8 oz) of fluid for each diarrhea episode. Avoid fluids that contain simple sugars, fruit juices, whole milk products, and sodas. Hydrate with an ORS. You may purchase the ORS or prepare it at home by mixing the following ingredients together:   - tsp (1.7-3  mL) table salt.   tsp (3  mL) baking soda.   tsp (1.7 mL) salt substitute containing potassium chloride.  1 tbsp (20 mL) sugar.  4.2 c (1 L) of water.   Certain foods and beverages may increase the speed at which food moves through the gastrointestinal (GI) tract. These foods and beverages should be avoided. They include:  Caffeinated and alcoholic beverages.  High-fiber foods, such as raw fruits and vegetables, nuts, seeds, and whole grain breads and cereals.  Foods and beverages sweetened with sugar alcohols, such as xylitol, sorbitol, and mannitol.   Some foods may be well tolerated and may help thicken stool. These include:  Starchy foods, such as rice, toast, pasta, low-sugar cereal, oatmeal, grits, baked potatoes, crackers, and bagels.  Bananas.  Applesauce.  Add probiotic-rich foods to help increase healthy bacteria in the GI tract. These include yogurt and fermented milk products.  Wash your hands well after each diarrhea episode.  Only take over-the-counter or prescription medicines as directed by your health care provider.  Take a warm bath to relieve any burning or pain from frequent diarrhea episodes. SEEK MEDICAL CARE IF:   You are not urinating as often.  Your urine is a dark color.  You become very tired or dizzy.  You have severe pain in the abdomen or rectum.  Your have blood or pus in your stools.  Your stools look black and tarry. SEEK IMMEDIATE MEDICAL CARE IF:   You are unable to keep fluids  down.  You have persistent vomiting.  You have blood in your stool.  Your stools are black and tarry.  You do not urinate in 6-8 hours, or there is only a small amount of very dark urine.  You have abdominal pain that increases or localizes.  You have weakness, dizziness, confusion, or lightheadedness.  You have a severe headache.  Your diarrhea gets worse  or does not get better.  You have a fever or persistent symptoms for more than 2-3 days.  You have a fever and your symptoms suddenly get worse. MAKE SURE YOU:   Understand these instructions.  Will watch your condition.  Will get help right away if you are not doing well or get worse.   This information is not intended to replace advice given to you by your health care provider. Make sure you discuss any questions you have with your health care provider.   Document Released: 10/05/2003 Document Revised: 07/20/2013 Document Reviewed: 01/07/2013 Elsevier Interactive Patient Education 2016 [employer].  Food Choices to Help Relieve Diarrhea, Adult When you have diarrhea, the foods you eat and your eating habits are very important. Choosing the right foods and drinks can help relieve diarrhea. Also, because diarrhea can last up to 7 days, you need to replace lost fluids and electrolytes (such as sodium, potassium, and chloride) in order to help prevent dehydration.  WHAT GENERAL GUIDELINES DO I NEED TO FOLLOW?  Slowly drink 1 cup (8 oz) of fluid for each episode of diarrhea. If you are getting enough fluid, your urine will be clear or pale yellow.  Eat starchy foods. Some good choices include white rice, white toast, pasta, low-fiber cereal, baked potatoes (without the skin), saltine crackers, and bagels.  Avoid large servings of any cooked vegetables.  Limit fruit to two servings per day. A serving is  cup or 1 small piece.  Choose foods with less than 2 g of fiber per serving.  Limit fats to less than 8 tsp (38 g)  per day.  Avoid fried foods.  Eat foods that have probiotics in them. Probiotics can be found in certain dairy products.  Avoid foods and beverages that may increase the speed at which food moves through the stomach and intestines (gastrointestinal tract). Things to avoid include:  High-fiber foods, such as dried fruit, raw fruits and vegetables, nuts, seeds, and whole grain foods.  Spicy foods and high-fat foods.  Foods and beverages sweetened with high-fructose corn syrup, honey, or sugar alcohols such as xylitol, sorbitol, and mannitol. WHAT FOODS ARE RECOMMENDED? Grains White rice. White, [place], or pita breads (fresh or toasted), including plain rolls, buns, or bagels. White pasta. Saltine, soda, or graham crackers. Pretzels. Low-fiber cereal. Cooked cereals made with water (such as cornmeal, farina, or cream cereals). Plain muffins. Matzo. Melba toast. Zwieback.  Vegetables Potatoes (without the skin). Strained tomato and vegetable juices. Most well-cooked and canned vegetables without seeds. Tender lettuce. Fruits Cooked or canned applesauce, apricots, cherries, fruit cocktail, grapefruit, peaches, pears, or plums. Fresh bananas, apples without skin, cherries, grapes, cantaloupe, grapefruit, peaches, oranges, or plums.  Meat and Other Protein Products Baked or boiled chicken. Eggs. Tofu. Fish. Seafood. Smooth peanut butter. Ground or well-cooked tender beef, ham, veal, lamb, pork, or poultry.  Dairy Plain yogurt, kefir, and unsweetened liquid yogurt. Lactose-free milk, buttermilk, or soy milk. Plain hard cheese. Beverages Sport drinks. Clear broths. Diluted fruit juices (except prune). Regular, caffeine-free sodas such as ginger ale. Water. Decaffeinated teas. Oral rehydration solutions. Sugar-free beverages not sweetened with sugar alcohols. Other Bouillon, broth, or soups made from recommended foods.  The items listed above may not be a complete list of recommended foods or  beverages. Contact your dietitian for more options. WHAT FOODS ARE NOT RECOMMENDED? Grains Whole grain, whole wheat, bran, or rye breads, rolls, pastas, crackers, and cereals. Wild or brown rice. Cereals that contain more than 2 g  of fiber per serving. Corn tortillas or taco shells. Cooked or dry oatmeal. Granola. Popcorn. Vegetables Raw vegetables. Cabbage, broccoli, Brussels sprouts, artichokes, baked beans, beet greens, corn, kale, legumes, peas, sweet potatoes, and yams. Potato skins. Cooked spinach and cabbage. Fruits Dried fruit, including raisins and dates. Raw fruits. Stewed or dried prunes. Fresh apples with skin, apricots, mangoes, pears, raspberries, and strawberries.  Meat and Other Protein Products Chunky peanut butter. Nuts and seeds. Beans and lentils. Tomasa Blase.  Dairy High-fat cheeses. Milk, chocolate milk, and beverages made with milk, such as milk shakes. Cream. Ice cream. Sweets and Desserts Sweet rolls, doughnuts, and sweet breads. Pancakes and waffles. Fats and Oils Butter. Cream sauces. Margarine. Salad oils. Plain salad dressings. Olives. Avocados.  Beverages Caffeinated beverages (such as coffee, tea, soda, or energy drinks). Alcoholic beverages. Fruit juices with pulp. Prune juice. Soft drinks sweetened with high-fructose corn syrup or sugar alcohols. Other Coconut. Hot sauce. Chili powder. Mayonnaise. Gravy. Cream-based or milk-based soups.  The items listed above may not be a complete list of foods and beverages to avoid. Contact your dietitian for more information. WHAT SHOULD I DO IF I BECOME DEHYDRATED? Diarrhea can sometimes lead to dehydration. Signs of dehydration include dark urine and dry mouth and skin. If you think you are dehydrated, you should rehydrate with an oral rehydration solution. These solutions can be purchased at pharmacies, retail stores, or online.  Drink -1 cup (120-240 mL) of oral rehydration solution each time you have an episode of diarrhea.  If drinking this amount makes your diarrhea worse, try drinking smaller amounts more often. For example, drink 1-3 tsp (5-15 mL) every 5-10 minutes.  A general rule for staying hydrated is to drink 1-2 L of fluid per day. Talk to your health care provider about the specific amount you should be drinking each day. Drink enough fluids to keep your urine clear or pale yellow.   This information is not intended to replace advice given to you by your health care provider. Make sure you discuss any questions you have with your health care provider.   Document Released: 10/05/2003 Document Revised: 08/05/2014 Document Reviewed: 06/07/2013 Elsevier Interactive Patient Education [employer]

## 2015-12-05 ENCOUNTER — Emergency Department: Payer: Self-pay

## 2015-12-05 ENCOUNTER — Encounter: Payer: Self-pay | Admitting: Emergency Medicine

## 2015-12-05 ENCOUNTER — Emergency Department
Admission: EM | Admit: 2015-12-05 | Discharge: 2015-12-05 | Disposition: A | Discharge status: Home / Self Care | Payer: Self-pay | Attending: Emergency Medicine | Admitting: Emergency Medicine

## 2015-12-05 DIAGNOSIS — F329 Major depressive disorder, single episode, unspecified: Secondary | ICD-10-CM | POA: Insufficient documentation

## 2015-12-05 DIAGNOSIS — F17210 Nicotine dependence, cigarettes, uncomplicated: Secondary | ICD-10-CM | POA: Insufficient documentation

## 2015-12-05 DIAGNOSIS — F1210 Cannabis abuse, uncomplicated: Secondary | ICD-10-CM | POA: Insufficient documentation

## 2015-12-05 DIAGNOSIS — N760 Acute vaginitis: Secondary | ICD-10-CM | POA: Insufficient documentation

## 2015-12-05 DIAGNOSIS — R55 Syncope and collapse: Secondary | ICD-10-CM

## 2015-12-05 DIAGNOSIS — B9689 Other specified bacterial agents as the cause of diseases classified elsewhere: Secondary | ICD-10-CM

## 2015-12-05 DIAGNOSIS — Z79899 Other long term (current) drug therapy: Secondary | ICD-10-CM | POA: Insufficient documentation

## 2015-12-05 DIAGNOSIS — R1030 Lower abdominal pain, unspecified: Secondary | ICD-10-CM

## 2015-12-05 DIAGNOSIS — R197 Diarrhea, unspecified: Secondary | ICD-10-CM | POA: Insufficient documentation

## 2015-12-05 DIAGNOSIS — R102 Pelvic and perineal pain: Secondary | ICD-10-CM

## 2015-12-05 DIAGNOSIS — R112 Nausea with vomiting, unspecified: Secondary | ICD-10-CM

## 2015-12-05 LAB — TROPONIN I

## 2015-12-05 LAB — URINALYSIS COMPLETE WITH MICROSCOPIC (ARMC ONLY)
BACTERIA UA: NONE SEEN
Bilirubin Urine: NEGATIVE
Glucose, UA: NEGATIVE mg/dL
Hgb urine dipstick: NEGATIVE
Leukocytes, UA: NEGATIVE
NITRITE: NEGATIVE
PH: 5 (ref 5.0–8.0)
PROTEIN: 30 mg/dL — AB
Specific Gravity, Urine: 1.047 — ABNORMAL HIGH (ref 1.005–1.030)

## 2015-12-05 LAB — CBC
HEMATOCRIT: 38.9 % (ref 35.0–47.0)
HEMOGLOBIN: 13.3 g/dL (ref 12.0–16.0)
MCH: 33.5 pg (ref 26.0–34.0)
MCHC: 34.1 g/dL (ref 32.0–36.0)
MCV: 98.2 fL (ref 80.0–100.0)
Platelets: 271 10*3/uL (ref 150–440)
RBC: 3.96 MIL/uL (ref 3.80–5.20)
RDW: 12.9 % (ref 11.5–14.5)
WBC: 7.2 10*3/uL (ref 3.6–11.0)

## 2015-12-05 LAB — COMPREHENSIVE METABOLIC PANEL
ALT: 13 U/L — AB (ref 14–54)
AST: 16 U/L (ref 15–41)
Albumin: 4.3 g/dL (ref 3.5–5.0)
Alkaline Phosphatase: 50 U/L (ref 38–126)
Anion gap: 5 (ref 5–15)
BILIRUBIN TOTAL: 1.2 mg/dL (ref 0.3–1.2)
BUN: 5 mg/dL — AB (ref 6–20)
CO2: 29 mmol/L (ref 22–32)
Calcium: 9.3 mg/dL (ref 8.9–10.3)
Chloride: 105 mmol/L (ref 101–111)
Creatinine, Ser: 0.89 mg/dL (ref 0.44–1.00)
GFR calc Af Amer: >60 mL/min (ref >60)
GFR calc non Af Amer: >60 mL/min (ref >60)
GLUCOSE: 96 mg/dL (ref 65–99)
POTASSIUM: 3.9 mmol/L (ref 3.5–5.1)
Sodium: 139 mmol/L (ref 135–145)
TOTAL PROTEIN: 7.3 g/dL (ref 6.5–8.1)

## 2015-12-05 LAB — WET PREP, GENITAL
Sperm: NONE SEEN
TRICH WET PREP: NONE SEEN
YEAST WET PREP: NONE SEEN

## 2015-12-05 LAB — POCT PREGNANCY, URINE: PREG TEST UR: NEGATIVE

## 2015-12-05 LAB — CHLAMYDIA/NGC RT PCR (ARMC ONLY)
Chlamydia Tr: NOT DETECTED
N gonorrhoeae: NOT DETECTED

## 2015-12-05 LAB — LIPASE, BLOOD: LIPASE: 16 U/L (ref 11–51)

## 2015-12-05 MED ORDER — ONDANSETRON HCL 4 MG/2ML IJ SOLN
4.0000 mg | Freq: Once | INTRAMUSCULAR | Status: AC
  Administered 2015-12-05: 4 mg via INTRAVENOUS
  Filled 2015-12-05: qty 2

## 2015-12-05 MED ORDER — SODIUM CHLORIDE 0.9 % IV BOLUS (SEPSIS)
1000.0000 mL | Freq: Once | INTRAVENOUS | Status: AC
  Administered 2015-12-05: 1000 mL via INTRAVENOUS

## 2015-12-05 MED ORDER — METRONIDAZOLE 500 MG PO TABS
500.0000 mg | ORAL_TABLET | Freq: Once | ORAL | Status: AC
  Administered 2015-12-05: 500 mg via ORAL
  Filled 2015-12-05: qty 1

## 2015-12-05 MED ORDER — METRONIDAZOLE 500 MG PO TABS: 500.0000 mg | ORAL_TABLET | Freq: Two times a day (BID) | ORAL | Status: DC

## 2015-12-05 MED ORDER — MORPHINE SULFATE (PF) 4 MG/ML IV SOLN
4.0000 mg | Freq: Once | INTRAVENOUS | Status: AC
  Administered 2015-12-05: 4 mg via INTRAVENOUS
  Filled 2015-12-05: qty 1

## 2015-12-05 NOTE — ED Provider Notes (Signed)
-----------------------------------------   12:55 AM on 12/05/2015 -----------------------------------------   Blood pressure 139/71, pulse 74, temperature 97.8 F (36.6 C), temperature source Oral, resp. rate 16, height 5\' 7"  (1.702 m), weight 180 lb (81.647 kg), last menstrual period 11/20/2015, SpO2 100 %, not currently breastfeeding.  Assuming care from Dr. York Cerise.  In short, Kendra [name] is a 25 y.o. female with a chief complaint of Loss of Consciousness; Abdominal Pain; and Diarrhea .  Refer to the original H&P for additional details.  The current plan of care is to follow up the results of the CT scan.   Ct Abd and Pelvis: No significant abnormality  The patient will be discharged home to follow-up with a GI physician.   Rebecka Apley, MD 12/05/15 [phone]

## 2015-12-05 NOTE — ED Provider Notes (Signed)
[facility] Emergency Department Provider Note   ____________________________________________  Time seen: Approximately 130 PM  I have reviewed the triage vital signs and the nursing notes.   HISTORY  Chief Complaint Abdominal Pain    HPI Kendra [name] is a 25 y.o. female with a history of anxiety and depression as well as diarrhea with abdominal pain was presented to the emergency department today as a bounce back for continued nausea, vomiting, diarrhea with lower abdominal pain. She says that she also continues to be presyncopal whenever she gets up to walk around. She says over the past week she has had intermittent, sharp lower abdominal pains which are now radiating through to the left side of her back. She denies any vaginal discharge or bleeding. Says she has had a history of chlamydia in the past. Denies any burning with urination. Says that she has had 3-4 episodes of nonbloody diarrhea per day. Also with intermittent vomiting. No recent antibiotics. Had a very thorough workup last night including CAT scan which did not show any acute pathology. Says that several years ago in [place] she had a colonoscopy which showed polyps which were removed. Said they were benign. Family history of ovarian cysts.Denies any chest pain, shortness of breath or palpitations.   Past Medical History  Diagnosis Date  . Anxiety   . Depression   . Tobacco abuse   . History of cannabis abuse     Patient Active Problem List   Diagnosis Date Noted  . Anxiety and depression 01/19/2015  . History of cannabis dependence/abuse (HCC) 01/19/2015  . Tobacco abuse 01/19/2015    Past Surgical History  Procedure Laterality Date  . Colon surgery  2013  . Wisdom tooth extraction  2013    Current Outpatient Rx  Name  Route  Sig  Dispense  Refill  . dicyclomine (BENTYL) 10 MG capsule   Oral   Take 1 capsule (10 mg total) by mouth 4 (four) times daily.   56 capsule   0   . sertraline (ZOLOFT) 100 MG tablet   Oral   Take 1 tablet (100 mg total) by mouth daily.   30 tablet   6     Allergies Penicillins  Family History  Problem Relation Age of Onset  . Diabetes Maternal Grandfather     Social History Social History  Substance Use Topics  . Smoking status: Current Every Day Smoker -- 1.00 packs/day    Types: Cigarettes  . Smokeless tobacco: None  . Alcohol Use: No    Review of Systems Constitutional: No fever/chills Eyes: No visual changes. ENT: No sore throat. Cardiovascular: Denies chest pain. Respiratory: Denies shortness of breath. Gastrointestinal:  No constipation. Genitourinary: Negative for dysuria. Musculoskeletal: As above Skin: Negative for rash. Neurological: Negative for headaches, focal weakness or numbness.  10-point ROS otherwise negative.  ____________________________________________   PHYSICAL EXAM:  VITAL SIGNS: ED Triage Vitals  Enc Vitals Group     BP 12/05/15 1230 125/84 mmHg     Pulse Rate 12/05/15 1114 69     Resp 12/05/15 1114 20     Temp 12/05/15 1114 98.1 F (36.7 C)     Temp Source 12/05/15 1114 Oral     SpO2 12/05/15 1114 97 %     Weight 12/05/15 1114 187 lb (84.823 kg)     Height 12/05/15 1114 5\' 7"  (1.702 m)     Head Cir --      Peak Flow --  Pain Score 12/05/15 1115 8     Pain Loc --      Pain Edu? --      Excl. in GC? --     Constitutional: Alert and oriented. Well appearing and in no acute distress. Eyes: Conjunctivae are normal. PERRL. EOMI. Head: Atraumatic. Nose: No congestion/rhinnorhea. Mouth/Throat: Mucous membranes are moist.   Neck: No stridor.   Cardiovascular: Normal rate, regular rhythm. Grossly normal heart sounds.  Good peripheral circulation. Respiratory: Normal respiratory effort.  No retractions. Lungs CTAB. Gastrointestinal: Soft With mild suprapubic as well as left lower quadrant tenderness palpation. No distention. No CVA tenderness. Genitourinary:  Normal external appearance. Speculum exam with a moderate amount of white discharge. Bimanual exam with positive CMT. Mild uterine tenderness to palpation without any neck tenderness or masses.  Musculoskeletal: No lower extremity tenderness nor edema.  No joint effusions. Neurologic:  Normal speech and language. No gross focal neurologic deficits are appreciated. No gait instability. Skin:  Skin is warm, dry and intact. No rash noted. Psychiatric: Mood and affect are normal. Speech and behavior are normal.  ____________________________________________   LABS (all labs ordered are listed, but only abnormal results are displayed)  Labs Reviewed  WET PREP, GENITAL - Abnormal; Notable for the following:    Clue Cells Wet Prep HPF POC PRESENT (*)    WBC, Wet Prep HPF POC FEW (*)    All other components within normal limits  COMPREHENSIVE METABOLIC PANEL - Abnormal; Notable for the following:    BUN 5 (*)    ALT 13 (*)    All other components within normal limits  URINALYSIS COMPLETEWITH MICROSCOPIC ([facility] ONLY) - Abnormal; Notable for the following:    Color, Urine YELLOW (*)    APPearance HAZY (*)    Ketones, ur TRACE (*)    Specific Gravity, Urine 1.047 (*)    Protein, ur 30 (*)    Squamous Epithelial / LPF 6-30 (*)    All other components within normal limits  CHLAMYDIA/NGC RT PCR ([facility] ONLY)  LIPASE, BLOOD  CBC  TROPONIN I  POCT PREGNANCY, URINE   ____________________________________________  EKG  ED ECG REPORT I, Arelia Longest, the attending physician, personally viewed and interpreted this ECG.   Date: 12/05/2015  EKG Time: 1403  Rate: 70  Rhythm: normal sinus rhythm  Axis: Normal  Intervals:none  ST&T Change: Q waves in the inferior leads as well as with biphasic T-wave in V2 and inversion in V3. No ST segment elevation or depression. No significant change from EKG done yesterday.  ____________________________________________  RADIOLOGY   [place]  Art/Ven Flow Abd Pelv Doppler (Final result) Result time: 12/05/15 15:23:45   Final result by Rad Results In Interface (12/05/15 15:23:45)   Narrative:   CLINICAL DATA: Left pelvic pain starting yesterday and worsening.  EXAM: TRANSABDOMINAL AND TRANSVAGINAL ULTRASOUND OF PELVIS  DOPPLER ULTRASOUND OF OVARIES  TECHNIQUE: Both transabdominal and transvaginal ultrasound examinations of the pelvis were performed. Transabdominal technique was performed for global imaging of the pelvis including uterus, ovaries, adnexal regions, and pelvic cul-de-sac.  It was necessary to proceed with endovaginal exam following the transabdominal exam to visualize the ovaries. Color and duplex Doppler ultrasound was utilized to evaluate blood flow to the ovaries.  COMPARISON: 06/16/2014  FINDINGS: Uterus  Measurements: 9 x 4 x 7 cm. No fibroids or other mass visualized.  Endometrium  Thickness: 1 cm. No focal abnormality visualized.  Right ovary  Measurements: 32 x 20 x 16 mm. Normal appearance/no  adnexal mass.  Left ovary  Measurements: 40 x 28 x 24 mm. Dominant follicle. Normal appearance/no adnexal mass.  Pulsed Doppler evaluation of both ovaries demonstrates normal low-resistance arterial and venous waveforms.  Other findings  No abnormal free fluid.  IMPRESSION: Normal pelvic ultrasound.   Electronically Signed By: Marnee Spring M.D. On: 12/05/2015 15:23          US Transvaginal Non-OB (Final result) Result time: 12/05/15 15:23:45   Final result by Rad Results In Interface (12/05/15 15:23:45)   Narrative:   CLINICAL DATA: Left pelvic pain starting yesterday and worsening.  EXAM: TRANSABDOMINAL AND TRANSVAGINAL ULTRASOUND OF PELVIS  DOPPLER ULTRASOUND OF OVARIES  TECHNIQUE: Both transabdominal and transvaginal ultrasound examinations of the pelvis were performed. Transabdominal technique was performed for global imaging of the pelvis including  uterus, ovaries, adnexal regions, and pelvic cul-de-sac.  It was necessary to proceed with endovaginal exam following the transabdominal exam to visualize the ovaries. Color and duplex Doppler ultrasound was utilized to evaluate blood flow to the ovaries.  COMPARISON: 06/16/2014  FINDINGS: Uterus  Measurements: 9 x 4 x 7 cm. No fibroids or other mass visualized.  Endometrium  Thickness: 1 cm. No focal abnormality visualized.  Right ovary  Measurements: 32 x 20 x 16 mm. Normal appearance/no adnexal mass.  Left ovary  Measurements: 40 x 28 x 24 mm. Dominant follicle. Normal appearance/no adnexal mass.  Pulsed Doppler evaluation of both ovaries demonstrates normal low-resistance arterial and venous waveforms.  Other findings  No abnormal free fluid.  IMPRESSION: Normal pelvic ultrasound.   Electronically Signed By: Marnee Spring M.D. On: 12/05/2015 15:23          US Pelvis Complete (Final result) Result time: 12/05/15 15:23:45   Final result by Rad Results In Interface (12/05/15 15:23:45)   Narrative:   CLINICAL DATA: Left pelvic pain starting yesterday and worsening.  EXAM: TRANSABDOMINAL AND TRANSVAGINAL ULTRASOUND OF PELVIS  DOPPLER ULTRASOUND OF OVARIES  TECHNIQUE: Both transabdominal and transvaginal ultrasound examinations of the pelvis were performed. Transabdominal technique was performed for global imaging of the pelvis including uterus, ovaries, adnexal regions, and pelvic cul-de-sac.  It was necessary to proceed with endovaginal exam following the transabdominal exam to visualize the ovaries. Color and duplex Doppler ultrasound was utilized to evaluate blood flow to the ovaries.  COMPARISON: 06/16/2014  FINDINGS: Uterus  Measurements: 9 x 4 x 7 cm. No fibroids or other mass visualized.  Endometrium  Thickness: 1 cm. No focal abnormality visualized.  Right ovary  Measurements: 32 x 20 x 16 mm. Normal appearance/no  adnexal mass.  Left ovary  Measurements: 40 x 28 x 24 mm. Dominant follicle. Normal appearance/no adnexal mass.  Pulsed Doppler evaluation of both ovaries demonstrates normal low-resistance arterial and venous waveforms.  Other findings  No abnormal free fluid.  IMPRESSION: Normal pelvic ultrasound.   Electronically Signed By: Marnee Spring M.D. On: 12/05/2015 15:23    ____________________________________________   PROCEDURES   ____________________________________________   INITIAL IMPRESSION / ASSESSMENT AND PLAN / ED COURSE  Pertinent labs & imaging results that were available during my care of the patient were reviewed by me and considered in my medical decision making (see chart for details).  ----------------------------------------- 5:07 PM on 12/05/2015 -----------------------------------------  Patient resting comfortably at this time and says only had one episode of diarrhea throughout her stay today. She had clue cells found on her wet prep. Despite her history of chlamydia she says that she is confident that she does not have a sexually transmitted  disease at this time. She says that she has had the same partner for some time now. I did discuss with her that her exam was concerning for pelvic inflammatory disease given her cervical motion tenderness. We discussed that this is usually caused by sexually transmitted disease. I did offer the patient treatment for several transmitted diseases but she is refusing at this time. I will treat her for her bacterial vaginosis. Says she'll be following up with GI this coming Thursday. She will continue with her Bentyl at home. Has not had any episodes of vomiting while in the emergency department here. We also discussed her near syncopal episodes and the need to make sure to stand up slowly and to stay hydrated. She is understanding of this plan and willing to comply.  PERC negative.     ____________________________________________   FINAL CLINICAL IMPRESSION(S) / ED DIAGNOSES  Final diagnoses:  Pelvic pain in female  Pelvic pain in female  Bacterial vaginosis. Nausea vomiting and diarrhea. Lower abdominal pain.    NEW MEDICATIONS STARTED DURING THIS VISIT:  New Prescriptions   No medications on file     Note:  This document was prepared using Dragon voice recognition software and may include unintentional dictation errors.    Myrna Blazer, MD 12/05/15 [phone]

## 2015-12-05 NOTE — Discharge Instructions (Signed)

## 2015-12-05 NOTE — ED Notes (Signed)
Pt in via triage with complaints of lower abdominal pain which radiates into lower back.  Pt reports being seen here yesterday and told to follow up w/ GI, GI unable to see her until Thursday and recommended she come back in due to severity of pain.  Pt reports N/V/D x one week, unable to keep down any solid food at this time.  Pt A/Ox4, vitals WDL, no immediate distress at this time.

## 2015-12-05 NOTE — ED Notes (Signed)
Patient transported to Ultrasound 

## 2015-12-05 NOTE — ED Notes (Signed)
Pt to ed with c/o abd pain.  Pt states seen here last night for same.  Pt reports pain is worse today, is having diarrhea.  Has appt with GI dr on Thursday but reports she does not feel she can wait that long.

## 2015-12-06 ENCOUNTER — Encounter: Payer: Self-pay | Admitting: *Deleted

## 2015-12-06 ENCOUNTER — Emergency Department
Admission: EM | Admit: 2015-12-06 | Discharge: 2015-12-06 | Disposition: A | Discharge status: Home / Self Care | Payer: Medicaid Other | Attending: Emergency Medicine | Admitting: Emergency Medicine

## 2015-12-06 DIAGNOSIS — E860 Dehydration: Secondary | ICD-10-CM | POA: Insufficient documentation

## 2015-12-06 DIAGNOSIS — Z79899 Other long term (current) drug therapy: Secondary | ICD-10-CM | POA: Insufficient documentation

## 2015-12-06 DIAGNOSIS — R1115 Cyclical vomiting syndrome unrelated to migraine: Secondary | ICD-10-CM

## 2015-12-06 DIAGNOSIS — F1290 Cannabis use, unspecified, uncomplicated: Secondary | ICD-10-CM | POA: Insufficient documentation

## 2015-12-06 DIAGNOSIS — G43A0 Cyclical vomiting, not intractable: Secondary | ICD-10-CM | POA: Insufficient documentation

## 2015-12-06 DIAGNOSIS — F17210 Nicotine dependence, cigarettes, uncomplicated: Secondary | ICD-10-CM | POA: Insufficient documentation

## 2015-12-06 DIAGNOSIS — F329 Major depressive disorder, single episode, unspecified: Secondary | ICD-10-CM | POA: Insufficient documentation

## 2015-12-06 LAB — COMPREHENSIVE METABOLIC PANEL
ALT: 15 U/L (ref 14–54)
AST: 15 U/L (ref 15–41)
Albumin: 4.2 g/dL (ref 3.5–5.0)
Alkaline Phosphatase: 50 U/L (ref 38–126)
Anion gap: 12 (ref 5–15)
BILIRUBIN TOTAL: 1.5 mg/dL — AB (ref 0.3–1.2)
BUN: 6 mg/dL (ref 6–20)
CO2: 21 mmol/L — ABNORMAL LOW (ref 22–32)
CREATININE: 0.83 mg/dL (ref 0.44–1.00)
Calcium: 9.2 mg/dL (ref 8.9–10.3)
Chloride: 106 mmol/L (ref 101–111)
GFR calc Af Amer: >60 mL/min (ref >60)
Glucose, Bld: 71 mg/dL (ref 65–99)
POTASSIUM: 3.6 mmol/L (ref 3.5–5.1)
Sodium: 139 mmol/L (ref 135–145)
TOTAL PROTEIN: 7.5 g/dL (ref 6.5–8.1)

## 2015-12-06 LAB — CBC
HCT: 40.3 % (ref 35.0–47.0)
Hemoglobin: 13.6 g/dL (ref 12.0–16.0)
MCH: 33.1 pg (ref 26.0–34.0)
MCHC: 33.8 g/dL (ref 32.0–36.0)
MCV: 98 fL (ref 80.0–100.0)
PLATELETS: 285 10*3/uL (ref 150–440)
RBC: 4.11 MIL/uL (ref 3.80–5.20)
RDW: 12.5 % (ref 11.5–14.5)
WBC: 10.2 10*3/uL (ref 3.6–11.0)

## 2015-12-06 LAB — URINALYSIS COMPLETE WITH MICROSCOPIC (ARMC ONLY)
BILIRUBIN URINE: NEGATIVE
Bacteria, UA: NONE SEEN
GLUCOSE, UA: NEGATIVE mg/dL
HGB URINE DIPSTICK: NEGATIVE
LEUKOCYTES UA: NEGATIVE
NITRITE: NEGATIVE
Protein, ur: 30 mg/dL — AB
SPECIFIC GRAVITY, URINE: 1.025 (ref 1.005–1.030)
pH: 5 (ref 5.0–8.0)

## 2015-12-06 LAB — LIPASE, BLOOD: Lipase: 15 U/L (ref 11–51)

## 2015-12-06 MED ORDER — ONDANSETRON 4 MG PO TBDP
4.0000 mg | ORAL_TABLET | Freq: Once | ORAL | Status: AC | PRN
  Administered 2015-12-06: 4 mg via ORAL

## 2015-12-06 MED ORDER — ONDANSETRON 4 MG PO TBDP
ORAL_TABLET | ORAL | Status: AC
  Filled 2015-12-06: qty 1

## 2015-12-06 MED ORDER — DEXTROSE-NACL 5-0.9 % IV SOLN
1000.0000 mL | Freq: Once | INTRAVENOUS | Status: AC
  Administered 2015-12-06: 1000 mL via INTRAVENOUS

## 2015-12-06 MED ORDER — KETOROLAC TROMETHAMINE 30 MG/ML IJ SOLN: 30.0000 mg | Freq: Once | INTRAMUSCULAR | Status: DC

## 2015-12-06 MED ORDER — RANITIDINE HCL 150 MG PO CAPS: 150.0000 mg | ORAL_CAPSULE | Freq: Two times a day (BID) | ORAL | Status: DC

## 2015-12-06 MED ORDER — PANTOPRAZOLE SODIUM 40 MG IV SOLR
40.0000 mg | Freq: Once | INTRAVENOUS | Status: AC
  Administered 2015-12-06: 40 mg via INTRAVENOUS
  Filled 2015-12-06: qty 40

## 2015-12-06 MED ORDER — GI COCKTAIL ~~LOC~~
30.0000 mL | ORAL | Status: AC
  Administered 2015-12-06: 30 mL via ORAL
  Filled 2015-12-06: qty 30

## 2015-12-06 MED ORDER — LOPERAMIDE HCL 2 MG PO TABS: 4.0000 mg | ORAL_TABLET | Freq: Four times a day (QID) | ORAL | Status: DC | PRN

## 2015-12-06 MED ORDER — MORPHINE SULFATE (PF) 4 MG/ML IV SOLN
4.0000 mg | Freq: Once | INTRAVENOUS | Status: DC
Start: 2015-12-06 — End: 2015-12-06

## 2015-12-06 MED ORDER — ONDANSETRON 4 MG PO TBDP: 4.0000 mg | ORAL_TABLET | Freq: Three times a day (TID) | ORAL | Status: DC | PRN

## 2015-12-06 MED ORDER — KETOROLAC TROMETHAMINE 30 MG/ML IJ SOLN
30.0000 mg | Freq: Once | INTRAMUSCULAR | Status: AC | PRN
  Administered 2015-12-06: 30 mg via INTRAVENOUS
  Filled 2015-12-06: qty 1

## 2015-12-06 NOTE — Discharge Instructions (Signed)

## 2015-12-06 NOTE — ED Provider Notes (Signed)
[facility] Emergency Department Provider Note  ____________________________________________  Time seen: 1:00 PM  I have reviewed the triage vital signs and the nursing notes.   HISTORY  Chief Complaint Abdominal Pain and Emesis    HPI Kendra [name] is a 25 y.o. female who complains of generalized abdominal pain with nausea and vomiting and watery diarrhea.She reports that this is been going on for about a week. She was seen in the emergency department 2 days ago and had a CT scan done which was unremarkable. She was seen in the emergency department yesterday and had ultrasounds of the pelvis as well as a pelvic exam which were all unremarkable. She was treated for bacterial vaginosis. She also reports a history of colon polyps. She called GI and has an appointment with him tomorrow at 3:15 PM. She is here today to request a GI consult in advance of her GI clinic visit tomorrow. She reports that her symptoms get much better with Zofran.     Past Medical History  Diagnosis Date  . Anxiety   . Depression   . Tobacco abuse   . History of cannabis abuse      Patient Active Problem List   Diagnosis Date Noted  . Anxiety and depression 01/19/2015  . History of cannabis dependence/abuse (HCC) 01/19/2015  . Tobacco abuse 01/19/2015     Past Surgical History  Procedure Laterality Date  . Colon surgery  2013  . Wisdom tooth extraction  2013     Current Outpatient Rx  Name  Route  Sig  Dispense  Refill  . dicyclomine (BENTYL) 10 MG capsule   Oral   Take 1 capsule (10 mg total) by mouth 4 (four) times daily.   56 capsule   0   . loperamide (IMODIUM A-D) 2 MG tablet   Oral   Take 2 tablets (4 mg total) by mouth 4 (four) times daily as needed for diarrhea or loose stools.   30 tablet   0   . metroNIDAZOLE (FLAGYL) 500 MG tablet   Oral   Take 1 tablet (500 mg total) by mouth 2 (two) times daily.   14 tablet   0   . ondansetron (ZOFRAN  ODT) 4 MG disintegrating tablet   Oral   Take 1 tablet (4 mg total) by mouth every 8 (eight) hours as needed for nausea or vomiting.   20 tablet   0   . ranitidine (ZANTAC) 150 MG capsule   Oral   Take 1 capsule (150 mg total) by mouth 2 (two) times daily.   28 capsule   0   . sertraline (ZOLOFT) 100 MG tablet   Oral   Take 1 tablet (100 mg total) by mouth daily.   30 tablet   6      Allergies Penicillins   Family History  Problem Relation Age of Onset  . Diabetes Maternal Grandfather     Social History Social History  Substance Use Topics  . Smoking status: Current Every Day Smoker -- 1.00 packs/day    Types: Cigarettes  . Smokeless tobacco: None  . Alcohol Use: No    Review of Systems  Constitutional:   No fever or chills.  Eyes:   No vision changes.  ENT:   No sore throat. No rhinorrhea. Cardiovascular:   No chest pain. Respiratory:   No dyspnea or cough. Gastrointestinal:   Positive for abdominal pain and vomiting and diarrhea.  Genitourinary:   Negative for dysuria or  difficulty urinating. Musculoskeletal:   Negative for focal pain or swelling Neurological:   Negative for headaches 10-point ROS otherwise negative.  ____________________________________________   PHYSICAL EXAM:  VITAL SIGNS: ED Triage Vitals  Enc Vitals Group     BP 12/06/15 1040 123/69 mmHg     Pulse Rate 12/06/15 1040 81     Resp 12/06/15 1040 15     Temp 12/06/15 1040 98.1 F (36.7 C)     Temp Source 12/06/15 1040 Oral     SpO2 12/06/15 1040 99 %     Weight 12/06/15 1040 185 lb (83.915 kg)     Height 12/06/15 1040 5\' 7"  (1.702 m)     Head Cir --      Peak Flow --      Pain Score 12/06/15 1051 7     Pain Loc --      Pain Edu? --      Excl. in GC? --     Vital signs reviewed, nursing assessments reviewed.   Constitutional:   Alert and oriented. Well appearing and in no distress. Eyes:   No scleral icterus. No conjunctival pallor. PERRL. EOMI.  No nystagmus. ENT    Head:   Normocephalic and atraumatic.   Nose:   No congestion/rhinnorhea. No septal hematoma   Mouth/Throat:   Dry mucous membranes, no pharyngeal erythema. No peritonsillar mass.    Neck:   No stridor. No SubQ emphysema. No meningismus. Hematological/Lymphatic/Immunilogical:   No cervical lymphadenopathy. Cardiovascular:   RRR. Symmetric bilateral radial and DP pulses.  No murmurs.  Respiratory:   Normal respiratory effort without tachypnea nor retractions. Breath sounds are clear and equal bilaterally. No wheezes/rales/rhonchi. Gastrointestinal:   Soft and nontender. Non distended. There is no CVA tenderness.  No rebound, rigidity, or guarding. Genitourinary:   deferred Musculoskeletal:   Nontender with normal range of motion in all extremities. No joint effusions.  No lower extremity tenderness.  No edema. Neurologic:   Normal speech and language.  CN 2-10 normal. Motor grossly intact. No gross focal neurologic deficits are appreciated.  Skin:    Skin is warm, dry and intact. No rash noted.  No petechiae, purpura, or bullae.  ____________________________________________    LABS (pertinent positives/negatives) (all labs ordered are listed, but only abnormal results are displayed) Labs Reviewed  COMPREHENSIVE METABOLIC PANEL - Abnormal; Notable for the following:    CO2 21 (*)    Total Bilirubin 1.5 (*)    All other components within normal limits  URINALYSIS COMPLETEWITH MICROSCOPIC ([facility] ONLY) - Abnormal; Notable for the following:    Color, Urine YELLOW (*)    APPearance CLOUDY (*)    Ketones, ur 2+ (*)    Protein, ur 30 (*)    Squamous Epithelial / LPF 6-30 (*)    All other components within normal limits  LIPASE, BLOOD  CBC   ____________________________________________   EKG    ____________________________________________     RADIOLOGY    ____________________________________________   PROCEDURES   ____________________________________________   INITIAL IMPRESSION / ASSESSMENT AND PLAN / ED COURSE  Pertinent labs & imaging results that were available during my care of the patient were reviewed by me and considered in my medical decision making (see chart for details).  Patient well appearing no acute distress, appears clinically dehydrated but has normal vital signs. Labs are all normal, urinalysis that shows some ketosis consistent with some starvation from poor oral intake. Patient given IV fluids with D5 normal saline, antiemetics and feeling better. She  is calm and texting on her phone. I do not see any reason for an urgent GI consult today. I reviewed the imaging results recently, there are no concerning findings on the CT or ultrasounds of the abdomen and pelvis. No evidence of torsion, no evidence of appendicitis or cholecystitis. On my physical exam today, I do not find any worrisome signs to suggest that the imaging miss an early process that is now evolved and there is no indication for repeat imaging at this time. Based on her history, negative results thus far, I strongly encouraged her to keep her GI appointment tomorrow for consideration of endoscopy. I'll start her on antiemetics and antacids to control her symptoms. Her pain may also be related to bacterial vaginosis for which she is currently being treated with antibiotics. Low suspicion for PID or TOA. Considering the patient's symptoms, medical history, and physical examination today, I have low suspicion for cholecystitis or biliary pathology, pancreatitis, perforation or bowel obstruction, hernia, intra-abdominal abscess, AAA or dissection, volvulus or intussusception, mesenteric ischemia, or appendicitis.       ____________________________________________   FINAL CLINICAL IMPRESSION(S) / ED DIAGNOSES  Final diagnoses:  Dehydration   Non-intractable cyclical vomiting with nausea       Portions of this note were generated with dragon dictation software. Dictation errors may occur despite best attempts at proofreading.   Sharman Cheek, MD 12/06/15 [phone]

## 2015-12-06 NOTE — ED Notes (Signed)
States she was seen in Ed yesterday and discharged with benatyl and abx, states she has been vomiting and not able to keep anything down

## 2015-12-07 ENCOUNTER — Encounter: Payer: Self-pay | Admitting: Gastroenterology

## 2015-12-07 ENCOUNTER — Ambulatory Visit (INDEPENDENT_AMBULATORY_CARE_PROVIDER_SITE_OTHER): Payer: Self-pay | Admitting: Gastroenterology

## 2015-12-07 VITALS — BP 143/87 | HR 68 | Temp 98.6°F | Ht 67.0 in | Wt 188.0 lb

## 2015-12-07 DIAGNOSIS — R1115 Cyclical vomiting syndrome unrelated to migraine: Secondary | ICD-10-CM

## 2015-12-07 DIAGNOSIS — R634 Abnormal weight loss: Secondary | ICD-10-CM

## 2015-12-07 DIAGNOSIS — G43A0 Cyclical vomiting, not intractable: Secondary | ICD-10-CM

## 2015-12-07 DIAGNOSIS — R197 Diarrhea, unspecified: Secondary | ICD-10-CM

## 2015-12-07 LAB — URINE CULTURE: SPECIAL REQUESTS: NORMAL

## 2015-12-07 MED ORDER — DICYCLOMINE HCL 20 MG PO TABS: 20.0000 mg | ORAL_TABLET | Freq: Three times a day (TID) | ORAL | Status: DC

## 2015-12-07 MED ORDER — PROMETHAZINE HCL 25 MG RE SUPP: 25.0000 mg | Freq: Four times a day (QID) | RECTAL | Status: DC | PRN

## 2015-12-07 NOTE — Progress Notes (Signed)
Gastroenterology Consultation  Referring Provider:     No ref. provider found Primary Care Physician:  No PCP Per Patient Primary Gastroenterologist:  Dr. Servando Snare     Reason for Consultation:     Diarrhea with nausea and vomiting        HPI:   Kendra [name] is a 25 y.o. y/o female referred for consultation & management of  Diarrhea with nausea and vomiting by Dr. Bonnetta Barry PCP Per Patient.   This patient comes in today after being in the emergency department three times this week. The patient was sent home the first night with a diagnosis of syncope due to diarrhea. The patient was also told she had a urinary tract infection at that time. The patient was then back in the emergency department the next day and the following day. At that time the patient was found to have bacterial vaginosis and not have a urinary tract infection. The patient was told that her vital signs are stable and she was sent home. The patient was very unhappy with her care at the emergency department. The patient continues to have her symptoms and she reports that she was given medication which she continually throws up. The patient was given Zofran which he states was too expensive for her to pick up. She also wasn't offered any Phenergan or suppositories since she had nausea vomiting. The patient reports that she had a colonoscopy at the age of 71 which showed her to have a large colon polyp that she states was the size of a walnut and a smaller polyp. She is under the impression that these polyps were worrisome although she does not recall if she was told to have a repeat colonoscopy. The patient has reported a 10 pound weight loss since this started over a week ago.  Past Medical History  Diagnosis Date  . Anxiety   . Depression   . Tobacco abuse   . History of cannabis abuse     Past Surgical History  Procedure Laterality Date  . Colon surgery  2013  . Wisdom tooth extraction  2013    Prior to Admission medications    Medication Sig Start Date End Date Taking? Authorizing Provider  dicyclomine (BENTYL) 10 MG capsule Take 1 capsule (10 mg total) by mouth 4 (four) times daily. 12/04/15 12/18/15 Yes Loleta Rose, MD  loperamide (IMODIUM A-D) 2 MG tablet Take 2 tablets (4 mg total) by mouth 4 (four) times daily as needed for diarrhea or loose stools. 12/06/15  Yes Sharman Cheek, MD  metroNIDAZOLE (FLAGYL) 500 MG tablet Take 500 mg by mouth 2 (two) times daily.   Yes Historical Provider, MD  ondansetron (ZOFRAN ODT) 4 MG disintegrating tablet Take 1 tablet (4 mg total) by mouth every 8 (eight) hours as needed for nausea or vomiting. 12/06/15  Yes Sharman Cheek, MD  ranitidine (ZANTAC) 150 MG capsule Take 1 capsule (150 mg total) by mouth 2 (two) times daily. 12/06/15  Yes Sharman Cheek, MD  sertraline (ZOLOFT) 100 MG tablet Take 1 tablet (100 mg total) by mouth daily. 02/07/15  Yes [name], MD  dicyclomine (BENTYL) 20 MG tablet Take 1 tablet (20 mg total) by mouth 4 (four) times daily -  before meals and at bedtime. 12/07/15   Midge Minium, MD  promethazine (PHENERGAN) 25 MG suppository Place 1 suppository (25 mg total) rectally every 6 (six) hours as needed for nausea or vomiting. 12/07/15   Midge Minium, MD    Family  History  Problem Relation Age of Onset  . Diabetes Maternal Grandfather   . Diverticulitis Maternal Grandmother      Social History  Substance Use Topics  . Smoking status: Current Every Day Smoker -- 1.00 packs/day    Types: Cigarettes  . Smokeless tobacco: None  . Alcohol Use: No    Allergies as of 12/07/2015 - Review Complete 12/07/2015  Allergen Reaction Noted  . Penicillins  01/19/2015    Review of Systems:    All systems reviewed and negative except where noted in HPI.   Physical Exam:  BP 143/87 mmHg  Pulse 68  Temp(Src) 98.6 F (37 C) (Oral)  Ht 5\' 7"  (1.702 m)  Wt 188 lb (85.276 kg)  BMI 29.44 kg/m2  LMP 11/20/2015 (Approximate) Patient's last menstrual period was  11/20/2015 (approximate). Psych:  Alert and cooperative. Normal mood and affect. General:   Alert,  Well-developed, well-nourished, pleasant and cooperative in NAD Head:  Normocephalic and atraumatic. Eyes:  Sclera clear, no icterus.   Conjunctiva pink. Ears:  Normal auditory acuity. Nose:  No deformity, discharge, or lesions. Mouth:  No deformity or lesions,oropharynx pink & moist. Neck:  Supple; no masses or thyromegaly. Lungs:  Respirations even and unlabored.  Clear throughout to auscultation.   No wheezes, crackles, or rhonchi. No acute distress. Heart:  Regular rate and rhythm; no murmurs, clicks, rubs, or gallops. Abdomen:  Normal bowel sounds.  No bruits.  Soft, Diffusely tender and non-distended without masses, hepatosplenomegaly or hernias noted.  No guarding or rebound tenderness.  Negative Carnett sign.   Rectal:  Deferred.  Msk:  Symmetrical without gross deformities.  Good, equal movement & strength bilaterally. Pulses:  Normal pulses noted. Extremities:  No clubbing or edema.  No cyanosis. Neurologic:  Alert and oriented x3;  grossly normal neurologically. Skin:  Intact without significant lesions or rashes.  No jaundice. Lymph Nodes:  No significant cervical adenopathy. Psych:  Alert and cooperative. Normal mood and affect.  Imaging Studies: US Transvaginal Non-ob  2015-12-12  CLINICAL DATA:  Left pelvic pain starting yesterday and worsening. EXAM: TRANSABDOMINAL AND TRANSVAGINAL ULTRASOUND OF PELVIS DOPPLER ULTRASOUND OF OVARIES TECHNIQUE: Both transabdominal and transvaginal ultrasound examinations of the pelvis were performed. Transabdominal technique was performed for global imaging of the pelvis including uterus, ovaries, adnexal regions, and pelvic cul-de-sac. It was necessary to proceed with endovaginal exam following the transabdominal exam to visualize the ovaries. Color and duplex Doppler ultrasound was utilized to evaluate blood flow to the ovaries. COMPARISON:   06/16/2014 FINDINGS: Uterus Measurements: 9 x 4 x 7 cm. No fibroids or other mass visualized. Endometrium Thickness: 1 cm.  No focal abnormality visualized. Right ovary Measurements: 32 x 20 x 16 mm. Normal appearance/no adnexal mass. Left ovary Measurements: 40 x 28 x 24 mm. Dominant follicle. Normal appearance/no adnexal mass. Pulsed Doppler evaluation of both ovaries demonstrates normal low-resistance arterial and venous waveforms. Other findings No abnormal free fluid. IMPRESSION: Normal pelvic ultrasound. Electronically Signed   By: Marnee Spring M.D.   On: Dec 12, 2015 15:23   US Pelvis Complete  12-Dec-2015  CLINICAL DATA:  Left pelvic pain starting yesterday and worsening. EXAM: TRANSABDOMINAL AND TRANSVAGINAL ULTRASOUND OF PELVIS DOPPLER ULTRASOUND OF OVARIES TECHNIQUE: Both transabdominal and transvaginal ultrasound examinations of the pelvis were performed. Transabdominal technique was performed for global imaging of the pelvis including uterus, ovaries, adnexal regions, and pelvic cul-de-sac. It was necessary to proceed with endovaginal exam following the transabdominal exam to visualize the ovaries. Color and duplex Doppler ultrasound  was utilized to evaluate blood flow to the ovaries. COMPARISON:  06/16/2014 FINDINGS: Uterus Measurements: 9 x 4 x 7 cm. No fibroids or other mass visualized. Endometrium Thickness: 1 cm.  No focal abnormality visualized. Right ovary Measurements: 32 x 20 x 16 mm. Normal appearance/no adnexal mass. Left ovary Measurements: 40 x 28 x 24 mm. Dominant follicle. Normal appearance/no adnexal mass. Pulsed Doppler evaluation of both ovaries demonstrates normal low-resistance arterial and venous waveforms. Other findings No abnormal free fluid. IMPRESSION: Normal pelvic ultrasound. Electronically Signed   By: Marnee Spring M.D.   On: 12/05/2015 15:23   Ct Abdomen Pelvis W Contrast  12/04/2015  CLINICAL DATA:  Sudden onset lightheadedness. Syncopal episode this evening. Last  week the patient had lower abdominal pain and diarrhea. EXAM: CT ABDOMEN AND PELVIS WITH CONTRAST TECHNIQUE: Multidetector CT imaging of the abdomen and pelvis was performed using the standard protocol following bolus administration of intravenous contrast. CONTRAST:  ISOVUE-300 IOPAMIDOL (ISOVUE-300) INJECTION 61% COMPARISON:  None. FINDINGS: Lower chest:  No significant abnormality Hepatobiliary: There are normal appearances of the liver, gallbladder and bile ducts. Pancreas: Normal Spleen: Normal Adrenals/Urinary Tract: The adrenals and kidneys are normal in appearance. There is no urinary calculus evident. There is no hydronephrosis or ureteral dilatation. Collecting systems and ureters appear unremarkable. Stomach/Bowel: There are normal appearances of the stomach, small bowel and colon. The appendix is normal. Vascular/Lymphatic: The abdominal aorta is normal in caliber. There is no atherosclerotic calcification. There is no adenopathy in the abdomen or pelvis. Reproductive: Unremarkable uterus and ovaries Other: No acute findings are evident in the abdomen or pelvis. There is no ascites. Musculoskeletal: No significant musculoskeletal abnormality. IMPRESSION: No significant abnormality. Electronically Signed   By: Ellery Plunk M.D.   On: 12/04/2015 23:52   [place] Art/ven Flow Abd Pelv Doppler  12/05/2015  CLINICAL DATA:  Left pelvic pain starting yesterday and worsening. EXAM: TRANSABDOMINAL AND TRANSVAGINAL ULTRASOUND OF PELVIS DOPPLER ULTRASOUND OF OVARIES TECHNIQUE: Both transabdominal and transvaginal ultrasound examinations of the pelvis were performed. Transabdominal technique was performed for global imaging of the pelvis including uterus, ovaries, adnexal regions, and pelvic cul-de-sac. It was necessary to proceed with endovaginal exam following the transabdominal exam to visualize the ovaries. Color and duplex Doppler ultrasound was utilized to evaluate blood flow to the ovaries. COMPARISON:   06/16/2014 FINDINGS: Uterus Measurements: 9 x 4 x 7 cm. No fibroids or other mass visualized. Endometrium Thickness: 1 cm.  No focal abnormality visualized. Right ovary Measurements: 32 x 20 x 16 mm. Normal appearance/no adnexal mass. Left ovary Measurements: 40 x 28 x 24 mm. Dominant follicle. Normal appearance/no adnexal mass. Pulsed Doppler evaluation of both ovaries demonstrates normal low-resistance arterial and venous waveforms. Other findings No abnormal free fluid. IMPRESSION: Normal pelvic ultrasound. Electronically Signed   By: Marnee Spring M.D.   On: 12/05/2015 15:23    Assessment and Plan:   RENIAH COTTINGHAM is a 25 y.o. y/o female  who comes in today after being in the emergency department three times this week. At her first visit she was told she had a urinary tract infection despite the urinalysis not being consistent with an infection and was not a clean catch. The patient was subsequently diagnosed on repeat visits to the ER by another physician as having bacterial vaginosis with positive clue cells. The patient was treated for this but was given expensive anti nausea medication and due to not having  Insurance the patient was unable to get it. She subsequently  just vomited up the pills she was given. The patient was unhappy with her emergency room care as reported to me. She will now be started on Phenergan suppositories with dicyclomine for her abdominal cramps in pain. The patient will contact me early next week if she is feeling better whereupon she is likely to need a repeat colonoscopy because of her history of colon polyps and new onset of diarrhea. The patient has been explained the plan and agrees with it.   Note: This dictation was prepared with Dragon dictation along with smaller phrase technology. Any transcriptional errors that result from this process are unintentional.

## 2015-12-08 ENCOUNTER — Telehealth: Payer: Self-pay | Admitting: Gastroenterology

## 2015-12-08 NOTE — Telephone Encounter (Signed)
Patient called in tears. She was seen yesterday by Dr. Servando Snare and today she stated that she blacked out and still very sick,nauseated. She didn't go to the ED today because she said they will not admit her. Please call her ASAP

## 2015-12-08 NOTE — Telephone Encounter (Signed)
Patient returned my call right away. I told patient that I had spoken to Dr. Servando Snare and he recommended for her to keep herself hydrated and not to stand so she doesn't black out. Patient stated that she can't keep anything in. Therefore, patient stated that she will go to the emergency room since she is not feeling well. Patient asked for me to call the emergency room to let them know that she was on her way. I told her that I would.  I told patient to give [place] a call if she had any difficulties. Patient understood.

## 2015-12-08 NOTE — Telephone Encounter (Signed)
Called patient back and had to leave her a voicemail.  Patient stated that

## 2015-12-11 ENCOUNTER — Telehealth: Payer: Self-pay | Admitting: Gastroenterology

## 2015-12-11 ENCOUNTER — Other Ambulatory Visit: Payer: Self-pay

## 2015-12-11 NOTE — Telephone Encounter (Signed)
Needs appointment for colonoscopy and endoscopy

## 2015-12-12 ENCOUNTER — Other Ambulatory Visit: Payer: Self-pay

## 2015-12-12 ENCOUNTER — Encounter: Payer: Self-pay | Admitting: *Deleted

## 2015-12-12 DIAGNOSIS — R1084 Generalized abdominal pain: Secondary | ICD-10-CM

## 2015-12-12 MED ORDER — PEG 3350-KCL-NA BICARB-NACL 420 G PO SOLR: 4000.0000 mL | ORAL | Status: DC

## 2015-12-12 NOTE — Telephone Encounter (Signed)
Pt scheduled for colonoscopy/EGD at [facility] on 12/14/15. Instructs emailed and rx sent to pt's pharmacy.

## 2015-12-13 NOTE — Discharge Instructions (Signed)

## 2015-12-14 ENCOUNTER — Encounter
Admission: RE | Disposition: A | Discharge status: Home / Self Care | Payer: Self-pay | Source: Ambulatory Visit | Attending: Gastroenterology

## 2015-12-14 ENCOUNTER — Encounter: Payer: Self-pay | Admitting: *Deleted

## 2015-12-14 ENCOUNTER — Ambulatory Visit: Payer: Self-pay | Admitting: Anesthesiology

## 2015-12-14 ENCOUNTER — Ambulatory Visit
Admission: RE | Admit: 2015-12-14 | Discharge: 2015-12-14 | Disposition: A | Discharge status: Home / Self Care | Payer: Self-pay | Source: Ambulatory Visit | Attending: Gastroenterology | Admitting: Gastroenterology

## 2015-12-14 DIAGNOSIS — R1115 Cyclical vomiting syndrome unrelated to migraine: Secondary | ICD-10-CM | POA: Insufficient documentation

## 2015-12-14 DIAGNOSIS — R112 Nausea with vomiting, unspecified: Secondary | ICD-10-CM | POA: Insufficient documentation

## 2015-12-14 DIAGNOSIS — F419 Anxiety disorder, unspecified: Secondary | ICD-10-CM | POA: Insufficient documentation

## 2015-12-14 DIAGNOSIS — F329 Major depressive disorder, single episode, unspecified: Secondary | ICD-10-CM | POA: Insufficient documentation

## 2015-12-14 DIAGNOSIS — K529 Noninfective gastroenteritis and colitis, unspecified: Secondary | ICD-10-CM | POA: Insufficient documentation

## 2015-12-14 DIAGNOSIS — G43A0 Cyclical vomiting, not intractable: Secondary | ICD-10-CM

## 2015-12-14 DIAGNOSIS — R197 Diarrhea, unspecified: Secondary | ICD-10-CM | POA: Insufficient documentation

## 2015-12-14 DIAGNOSIS — F17210 Nicotine dependence, cigarettes, uncomplicated: Secondary | ICD-10-CM | POA: Insufficient documentation

## 2015-12-14 HISTORY — DX: Unspecified convulsions: R56.9

## 2015-12-14 HISTORY — DX: Headache, unspecified: R51.9

## 2015-12-14 HISTORY — PX: ESOPHAGOGASTRODUODENOSCOPY (EGD) WITH PROPOFOL: SHX5813

## 2015-12-14 HISTORY — DX: Headache: R51

## 2015-12-14 HISTORY — PX: COLONOSCOPY WITH PROPOFOL: SHX5780

## 2015-12-14 SURGERY — COLONOSCOPY WITH PROPOFOL
Anesthesia: Monitor Anesthesia Care | Wound class: Contaminated

## 2015-12-14 MED ORDER — STERILE WATER FOR IRRIGATION IR SOLN
Status: DC | PRN
  Administered 2015-12-14: 09:00:00

## 2015-12-14 MED ORDER — SODIUM CHLORIDE 0.9 % IV SOLN: INTRAVENOUS | Status: DC

## 2015-12-14 MED ORDER — LIDOCAINE HCL (CARDIAC) 20 MG/ML IV SOLN
INTRAVENOUS | Status: DC | PRN
  Administered 2015-12-14: 40 mg via INTRAVENOUS

## 2015-12-14 MED ORDER — ACETAMINOPHEN 325 MG PO TABS: 325.0000 mg | ORAL_TABLET | ORAL | Status: DC | PRN

## 2015-12-14 MED ORDER — GLYCOPYRROLATE 0.2 MG/ML IJ SOLN
INTRAMUSCULAR | Status: DC | PRN
  Administered 2015-12-14: 0.2 mg via INTRAVENOUS

## 2015-12-14 MED ORDER — LACTATED RINGERS IV SOLN
INTRAVENOUS | Status: DC
  Administered 2015-12-14: 08:00:00 via INTRAVENOUS

## 2015-12-14 MED ORDER — ACETAMINOPHEN 160 MG/5ML PO SOLN: 325.0000 mg | ORAL | Status: DC | PRN

## 2015-12-14 MED ORDER — PROPOFOL 10 MG/ML IV BOLUS
INTRAVENOUS | Status: DC | PRN
Start: 2015-12-14 — End: 2015-12-14
  Administered 2015-12-14 (×2): 20 mg via INTRAVENOUS
  Administered 2015-12-14: 80 mg via INTRAVENOUS
  Administered 2015-12-14: 20 mg via INTRAVENOUS
  Administered 2015-12-14: 30 mg via INTRAVENOUS
  Administered 2015-12-14: 20 mg via INTRAVENOUS
  Administered 2015-12-14: 30 mg via INTRAVENOUS
  Administered 2015-12-14: 20 mg via INTRAVENOUS
  Administered 2015-12-14: 30 mg via INTRAVENOUS

## 2015-12-14 MED ORDER — ONDANSETRON HCL 4 MG/2ML IJ SOLN: 4.0000 mg | Freq: Once | INTRAMUSCULAR | Status: DC | PRN

## 2015-12-14 SURGICAL SUPPLY — 33 items
BALLN DILATOR 10-12 8 (BALLOONS)
BALLN DILATOR 12-15 8 (BALLOONS)
BALLN DILATOR 15-18 8 (BALLOONS)
BALLN DILATOR CRE 0-12 8 (BALLOONS)
BALLN DILATOR ESOPH 8 10 CRE (MISCELLANEOUS) IMPLANT
BALLOON DILATOR 12-15 8 (BALLOONS) IMPLANT
BALLOON DILATOR 15-18 8 (BALLOONS) IMPLANT
BALLOON DILATOR CRE 0-12 8 (BALLOONS) IMPLANT
BLOCK BITE 60FR ADLT L/F GRN (MISCELLANEOUS) ×3 IMPLANT
CANISTER SUCT 1200ML W/VALVE (MISCELLANEOUS) ×3 IMPLANT
CLIP HMST 235XBRD CATH ROT (MISCELLANEOUS) IMPLANT
CLIP RESOLUTION 360 11X235 (MISCELLANEOUS)
FCP ESCP3.2XJMB 240X2.8X (MISCELLANEOUS)
FORCEPS BIOP RAD 4 LRG CAP 4 (CUTTING FORCEPS) ×3 IMPLANT
FORCEPS BIOP RJ4 240 W/NDL (MISCELLANEOUS)
FORCEPS ESCP3.2XJMB 240X2.8X (MISCELLANEOUS) IMPLANT
GOWN CVR UNV OPN BCK APRN NK (MISCELLANEOUS) ×2 IMPLANT
GOWN ISOL THUMB LOOP REG UNIV (MISCELLANEOUS) ×4
INJECTOR VARIJECT VIN23 (MISCELLANEOUS) IMPLANT
KIT DEFENDO VALVE AND CONN (KITS) IMPLANT
KIT ENDO PROCEDURE OLY (KITS) ×3 IMPLANT
MARKER SPOT ENDO TATTOO 5ML (MISCELLANEOUS) IMPLANT
PAD GROUND ADULT SPLIT (MISCELLANEOUS) IMPLANT
PROBE APC STR FIRE (PROBE) IMPLANT
SNARE SHORT THROW 13M SML OVAL (MISCELLANEOUS) IMPLANT
SNARE SHORT THROW 30M LRG OVAL (MISCELLANEOUS) IMPLANT
SNARE SNG USE RND 15MM (INSTRUMENTS) IMPLANT
SPOT EX ENDOSCOPIC TATTOO (MISCELLANEOUS)
SYR INFLATION 60ML (SYRINGE) IMPLANT
TRAP ETRAP POLY (MISCELLANEOUS) IMPLANT
VARIJECT INJECTOR VIN23 (MISCELLANEOUS)
WATER STERILE IRR 250ML POUR (IV SOLUTION) ×3 IMPLANT
WIRE CRE 18-20MM 8CM F G (MISCELLANEOUS) IMPLANT

## 2015-12-14 NOTE — Transfer of Care (Signed)
Immediate Anesthesia Transfer of Care Note  Patient: Kendra [name]  Procedure(s) Performed: Procedure(s): COLONOSCOPY WITH PROPOFOL (N/A) ESOPHAGOGASTRODUODENOSCOPY (EGD) WITH PROPOFOL (N/A)  Patient Location: PACU  Anesthesia Type: MAC  Level of Consciousness: awake, alert  and patient cooperative  Airway and Oxygen Therapy: Patient Spontanous Breathing and Patient connected to supplemental oxygen  Post-op Assessment: Post-op Vital signs reviewed, Patient's Cardiovascular Status Stable, Respiratory Function Stable, Patent Airway and No signs of Nausea or vomiting  Post-op Vital Signs: Reviewed and stable  Complications: No apparent anesthesia complications

## 2015-12-14 NOTE — Progress Notes (Addendum)
When reviewing discharge instructions, particularly when advising to seek medical care if chest pain occurs, patient verbalized she was currently having a sharp shoot pain on her left upper chest. Pain resolved on it's own within seconds. Pt verbalizes she has experienced this pain prior to this admission. Dr. Maisie Fus, anesthesia informed. NSR noted in the monitor, VSS. Dr. Maisie Fus verbalized the pain is likely musculoskeletal in nature and to seek care if she experiences chest pain after discharge; patient and patient's mother verbalize undestanding. Pt remains asymptomatic since initial "episode" of this pain here in PACU. Pt OK to discharge home per Dr. Maisie Fus.

## 2015-12-14 NOTE — Anesthesia Preprocedure Evaluation (Addendum)
Anesthesia Evaluation  Patient identified by MRN, date of birth, ID band Patient awake    Reviewed: Allergy & Precautions, H&P , NPO status   Airway Mallampati: II  TM Distance: >3 FB Neck ROM: full    Dental   Pulmonary Current Smoker,    breath sounds clear to auscultation       Cardiovascular Normal cardiovascular exam     Neuro/Psych  Headaches, Seizures -,  PSYCHIATRIC DISORDERS    GI/Hepatic   Endo/Other    Renal/GU      Musculoskeletal   Abdominal   Peds  Hematology   Anesthesia Other Findings   Reproductive/Obstetrics                            Anesthesia Physical Anesthesia Plan  ASA: II  Anesthesia Plan: MAC   Post-op Pain Management:    Induction:   Airway Management Planned:   Additional Equipment:   Intra-op Plan:   Post-operative Plan:   Informed Consent: I have reviewed the patients History and Physical, chart, labs and discussed the procedure including the risks, benefits and alternatives for the proposed anesthesia with the patient or authorized representative who has indicated his/her understanding and acceptance.     Plan Discussed with: CRNA  Anesthesia Plan Comments:         Anesthesia Quick Evaluation

## 2015-12-14 NOTE — H&P (Signed)
[facility]  [address] [place], [place] [address] Phone: [phone] Fax : [phone]  Primary Care Physician:  No PCP Per Patient Primary Gastroenterologist:  Dr. Servando Snare  Pre-Procedure History & Physical: HPI:  Kendra [name] is a 25 y.o. female is here for an endoscopy and colonoscopy.   Past Medical History  Diagnosis Date  . Anxiety   . Depression   . Tobacco abuse   . History of cannabis abuse   . Headache     stress  . Seizures (HCC)     age 45. syncopal episode. Hit head. had seizure.     Past Surgical History  Procedure Laterality Date  . Wisdom tooth extraction  2013  . Colon surgery  2013    pt states "colonoscopy while awake"    Prior to Admission medications   Medication Sig Start Date End Date Taking? Authorizing Provider  dicyclomine (BENTYL) 10 MG capsule Take 1 capsule (10 mg total) by mouth 4 (four) times daily. 12/04/15 12/18/15 Yes Loleta Rose, MD  dicyclomine (BENTYL) 20 MG tablet Take 1 tablet (20 mg total) by mouth 4 (four) times daily -  before meals and at bedtime. 12/07/15  Yes Laguana Desautel Servando Snare, MD  LORazepam (ATIVAN) 1 MG tablet Take 1 mg by mouth every 8 (eight) hours as needed for anxiety.   Yes Historical Provider, MD  metroNIDAZOLE (FLAGYL) 500 MG tablet Take 500 mg by mouth 2 (two) times daily.   Yes Historical Provider, MD  nitrofurantoin (MACRODANTIN) 100 MG capsule Take 100 mg by mouth 2 (two) times daily.   Yes Historical Provider, MD  polyethylene glycol-electrolytes (TRILYTE) 420 g solution Take 4,000 mLs by mouth as directed. Drink one 8 oz glass every 20 mins until stools are clear. 12/12/15  Yes Midge Minium, MD  promethazine (PHENERGAN) 25 MG suppository Place 1 suppository (25 mg total) rectally every 6 (six) hours as needed for nausea or vomiting. 12/07/15  Yes Midge Minium, MD  loperamide (IMODIUM A-D) 2 MG tablet Take 2 tablets (4 mg total) by mouth 4 (four) times daily as needed for diarrhea or loose  stools. Patient not taking: Reported on 12/14/2015 12/06/15   Sharman Cheek, MD  ondansetron (ZOFRAN ODT) 4 MG disintegrating tablet Take 1 tablet (4 mg total) by mouth every 8 (eight) hours as needed for nausea or vomiting. Patient not taking: Reported on 12/12/2015 12/06/15   Sharman Cheek, MD  ranitidine (ZANTAC) 150 MG capsule Take 1 capsule (150 mg total) by mouth 2 (two) times daily. Patient not taking: Reported on 12/12/2015 12/06/15   Sharman Cheek, MD  sertraline (ZOLOFT) 100 MG tablet Take 1 tablet (100 mg total) by mouth daily. Patient not taking: Reported on 12/12/2015 02/07/15   [name], MD    Allergies as of 12/11/2015 - Review Complete 12/07/2015  Allergen Reaction Noted  . Penicillins  01/19/2015    Family History  Problem Relation Age of Onset  . Diabetes Maternal Grandfather   . Diverticulitis Maternal Grandmother     Social History   Social History  . Marital Status: Single    Spouse Name: N/A  . Number of Children: N/A  . Years of Education: N/A   Occupational History  . Not on file.   Social History Main Topics  . Smoking status: Current Every Day Smoker -- 1.00 packs/day for 8 years    Types: Cigarettes  . Smokeless tobacco: Not on file  . Alcohol Use: No  . Drug Use: Yes  Special: Marijuana  . Sexual Activity: Yes   Other Topics Concern  . Not on file   Social History Narrative    Review of Systems: See HPI, otherwise negative ROS  Physical Exam: BP 117/77 mmHg  Pulse 79  Temp(Src) 97.7 F (36.5 C)  Resp 16  Ht  (1.702 m)  Wt 186 lb (84.369 kg)  BMI 29.12 kg/m2  SpO2 100%  LMP 11/20/2015 (Approximate)  Breastfeeding? No General:   Alert,  pleasant and cooperative in NAD Head:  Normocephalic and atraumatic. Neck:  Supple; no masses or thyromegaly. Lungs:  Clear throughout to auscultation.    Heart:  Regular rate and rhythm. Abdomen:  Soft, nontender and nondistended. Normal bowel sounds, without guarding, and  without rebound.   Neurologic:  Alert and  oriented x4;  grossly normal neurologically.  Impression/Plan: Kendra [name] is here for an endoscopy and colonoscopy to be performed for diarrhea with nausea and vomiting.  Risks, benefits, limitations, and alternatives regarding  endoscopy and colonoscopy have been reviewed with the patient.  Questions have been answered.  All parties agreeable.   Midge Minium, MD  12/14/2015, 8:32 AM

## 2015-12-14 NOTE — Anesthesia Procedure Notes (Signed)
Procedure Name: MAC Performed by: Mariell Nester Pre-anesthesia Checklist: Patient identified, Emergency Drugs available, Suction available, Timeout performed and Patient being monitored Patient Re-evaluated:Patient Re-evaluated prior to inductionOxygen Delivery Method: Nasal cannula Placement Confirmation: positive ETCO2       

## 2015-12-14 NOTE — Op Note (Signed)
[facility] Patient Name: Kendra [name] Procedure Date: 12/14/2015 9:01 AM MRN: [id number] Account #: [id number] Date of Birth: 29-Mar-1991 Admit Type: Outpatient Age: 25 Room: [facility] OR ROOM 01 Gender: Female Note Status: Finalized Procedure:            Colonoscopy Indications:          Chronic diarrhea Providers:            Midge Minium, MD Medicines:            Propofol per Anesthesia Complications:        No immediate complications. Procedure:            Pre-Anesthesia Assessment:                       - Prior to the procedure, a History and Physical was                        performed, and patient medications and allergies were                        reviewed. The patient's tolerance of previous                        anesthesia was also reviewed. The risks and benefits of                        the procedure and the sedation options and risks were                        discussed with the patient. All questions were                        answered, and informed consent was obtained. Prior                        Anticoagulants: The patient has taken no previous                        anticoagulant or antiplatelet agents. ASA Grade                        Assessment: II - A patient with mild systemic disease.                        After reviewing the risks and benefits, the patient was                        deemed in satisfactory condition to undergo the                        procedure.                       After obtaining informed consent, the colonoscope was                        passed under direct vision. Throughout the procedure,                        the patient's blood pressure, pulse, and oxygen  saturations were monitored continuously. The Olympus                        CF-HQ190L Colonoscope (S#. [id number]) was introduced                        through the anus and advanced to the the terminal          ileum. The colonoscopy was performed without                        difficulty. The patient tolerated the procedure well.                        The quality of the bowel preparation was good. Findings:      The perianal and digital rectal examinations were normal.      The colon (entire examined portion) appeared normal. Random biopsies       were obtained with cold forceps for histology randomly in the entire       colon.      The terminal ileum appeared normal. Biopsies were taken with a cold       forceps for histology. Impression:           - The entire examined colon is normal.                       - The examined portion of the ileum was normal.                        Biopsied.                       - Random biopsies were obtained in the entire colon. Recommendation:       - Await pathology results. Procedure Code(s):    --- Professional ---                       [phone], Colonoscopy, flexible; with biopsy, single or                        multiple Diagnosis Code(s):    --- Professional ---                       K52.9, Noninfective gastroenteritis and colitis,                        unspecified CPT copyright 2016 American Medical Association. All rights reserved. The codes documented in this report are preliminary and upon coder review may  be revised to meet current compliance requirements. Midge Minium, MD 12/14/2015 9:28:56 AM This report has been signed electronically. Number of Addenda: 0 Note Initiated On: 12/14/2015 9:01 AM Scope Withdrawal Time: 0 hours 6 minutes 19 seconds  Total Procedure Duration: 0 hours 6 minutes 53 seconds       [facility]

## 2015-12-14 NOTE — Op Note (Signed)
[facility] Gastroenterology Patient Name: Kendra [name] Procedure Date: 12/14/2015 9:01 AM MRN: [id number] Account #: [id number] Date of Birth: Aug 12, 1990 Admit Type: Outpatient Age: 25 Room: [facility] OR ROOM 01 Gender: Female Note Status: Finalized Procedure:            Upper GI endoscopy Indications:          Diarrhea, Nausea with vomiting Providers:            Midge Minium, MD Medicines:            Propofol per Anesthesia Complications:        No immediate complications. Procedure:            Pre-Anesthesia Assessment:                       - Prior to the procedure, a History and Physical was                        performed, and patient medications and allergies were                        reviewed. The patient's tolerance of previous                        anesthesia was also reviewed. The risks and benefits of                        the procedure and the sedation options and risks were                        discussed with the patient. All questions were                        answered, and informed consent was obtained. Prior                        Anticoagulants: The patient has taken no previous                        anticoagulant or antiplatelet agents. ASA Grade                        Assessment: II - A patient with mild systemic disease.                        After reviewing the risks and benefits, the patient was                        deemed in satisfactory condition to undergo the                        procedure.                       After obtaining informed consent, the endoscope was                        passed under direct vision. Throughout the procedure,                        the patient's blood pressure,  pulse, and oxygen                        saturations were monitored continuously. The was                        introduced through the mouth, and advanced to the                        second part of duodenum. The upper GI endoscopy was                         accomplished without difficulty. The patient tolerated                        the procedure well. Findings:      The examined esophagus was normal.      The entire examined stomach was normal.      The examined duodenum was normal. Biopsies were taken with a cold       forceps for histology. Impression:           - Normal esophagus.                       - Normal stomach.                       - Normal examined duodenum. Biopsied. Recommendation:       - Await pathology results.                       - Perform a colonoscopy today. Procedure Code(s):    --- Professional ---                       [phone], Esophagogastroduodenoscopy, flexible, transoral;                        with biopsy, single or multiple Diagnosis Code(s):    --- Professional ---                       R11.2, Nausea with vomiting, unspecified                       R19.7, Diarrhea, unspecified CPT copyright 2016 American Medical Association. All rights reserved. The codes documented in this report are preliminary and upon coder review may  be revised to meet current compliance requirements. Midge Minium, MD 12/14/2015 9:18:15 AM This report has been signed electronically. Number of Addenda: 0 Note Initiated On: 12/14/2015 9:01 AM      [facility]

## 2015-12-14 NOTE — Anesthesia Postprocedure Evaluation (Signed)
Anesthesia Post Note  Patient: Kendra [name]  Procedure(s) Performed: Procedure(s) (LRB): COLONOSCOPY WITH PROPOFOL (N/A) ESOPHAGOGASTRODUODENOSCOPY (EGD) WITH PROPOFOL (N/A)  Patient location during evaluation: PACU Anesthesia Type: MAC Level of consciousness: awake and alert Pain management: pain level controlled Vital Signs Assessment: post-procedure vital signs reviewed and stable Respiratory status: spontaneous breathing, nonlabored ventilation, respiratory function stable and patient connected to nasal cannula oxygen Cardiovascular status: stable and blood pressure returned to baseline Anesthetic complications: no    Durene Fruits

## 2015-12-15 ENCOUNTER — Encounter: Payer: Self-pay | Admitting: Gastroenterology

## 2015-12-18 ENCOUNTER — Encounter: Payer: Self-pay | Admitting: Gastroenterology

## 2015-12-19 NOTE — Telephone Encounter (Signed)
Left vm letting pt know the results of her procedures. Advised her to contact me with any questions.

## 2015-12-19 NOTE — Telephone Encounter (Signed)
-----   Message from Midge Minium, MD sent at 12/18/2015  6:39 PM EDT ----- Please let the patient know that all of her biopsies from both the upper endoscopy and colonoscopy were all normal for any inflammation or the cause of any of her symptoms.

## 2015-12-21 ENCOUNTER — Ambulatory Visit: Payer: Medicaid Other | Admitting: Primary Care

## 2018-06-26 ENCOUNTER — Emergency Department
Admission: EM | Admit: 2018-06-26 | Discharge: 2018-06-26 | Disposition: A | Discharge status: Home / Self Care | Payer: Medicaid Other | Attending: Student in an Organized Health Care Education/Training Program | Admitting: Student in an Organized Health Care Education/Training Program

## 2018-06-26 ENCOUNTER — Other Ambulatory Visit: Payer: Self-pay

## 2018-06-26 DIAGNOSIS — M7918 Myalgia, other site: Secondary | ICD-10-CM | POA: Diagnosis not present

## 2018-06-26 DIAGNOSIS — N3000 Acute cystitis without hematuria: Secondary | ICD-10-CM | POA: Insufficient documentation

## 2018-06-26 DIAGNOSIS — F1120 Opioid dependence, uncomplicated: Secondary | ICD-10-CM | POA: Insufficient documentation

## 2018-06-26 DIAGNOSIS — F17210 Nicotine dependence, cigarettes, uncomplicated: Secondary | ICD-10-CM | POA: Insufficient documentation

## 2018-06-26 DIAGNOSIS — F1210 Cannabis abuse, uncomplicated: Secondary | ICD-10-CM | POA: Insufficient documentation

## 2018-06-26 LAB — URINALYSIS, COMPLETE (UACMP) WITH MICROSCOPIC
Bilirubin Urine: NEGATIVE
GLUCOSE, UA: NEGATIVE mg/dL
Hgb urine dipstick: NEGATIVE
Ketones, ur: NEGATIVE mg/dL
Nitrite: POSITIVE — AB
PH: 5 (ref 5.0–8.0)
PROTEIN: 30 mg/dL — AB
Specific Gravity, Urine: 1.03 (ref 1.005–1.030)

## 2018-06-26 LAB — POCT PREGNANCY, URINE: Preg Test, Ur: NEGATIVE

## 2018-06-26 MED ORDER — ONDANSETRON HCL 4 MG PO TABS: 4.0000 mg | ORAL_TABLET | Freq: Every day | ORAL | 0 refills | Status: AC | PRN

## 2018-06-26 MED ORDER — CEPHALEXIN 500 MG PO CAPS
500.0000 mg | ORAL_CAPSULE | Freq: Once | ORAL | Status: AC
  Administered 2018-06-26: 500 mg via ORAL
  Filled 2018-06-26: qty 1

## 2018-06-26 MED ORDER — PROMETHAZINE HCL 25 MG PO TABS
25.0000 mg | ORAL_TABLET | Freq: Once | ORAL | Status: AC
  Administered 2018-06-26: 25 mg via ORAL
  Filled 2018-06-26: qty 1

## 2018-06-26 MED ORDER — BUPRENORPHINE HCL-NALOXONE HCL 8-2 MG SL SUBL: 1.0000 | SUBLINGUAL_TABLET | Freq: Every day | SUBLINGUAL | Status: DC

## 2018-06-26 MED ORDER — BUPRENORPHINE HCL-NALOXONE HCL 2-0.5 MG SL SUBL
2.0000 | SUBLINGUAL_TABLET | Freq: Every day | SUBLINGUAL | Status: DC
  Filled 2018-06-26: qty 2

## 2018-06-26 MED ORDER — BUPRENORPHINE HCL-NALOXONE HCL 2-0.5 MG SL SUBL
1.0000 | SUBLINGUAL_TABLET | Freq: Every day | SUBLINGUAL | Status: DC
  Administered 2018-06-26: 1 via SUBLINGUAL

## 2018-06-26 MED ORDER — PROMETHAZINE HCL 25 MG PO TABS: 12.5000 mg | ORAL_TABLET | Freq: Four times a day (QID) | ORAL | 0 refills | Status: DC | PRN

## 2018-06-26 MED ORDER — LORAZEPAM 1 MG PO TABS
1.0000 mg | ORAL_TABLET | Freq: Once | ORAL | Status: AC
  Administered 2018-06-26: 1 mg via ORAL
  Filled 2018-06-26: qty 1

## 2018-06-26 MED ORDER — CEPHALEXIN 500 MG PO CAPS: 500.0000 mg | ORAL_CAPSULE | Freq: Three times a day (TID) | ORAL | 0 refills | Status: AC

## 2018-06-26 NOTE — ED Provider Notes (Addendum)
[facility] [place] Emergency Department Provider Note    First MD Initiated Contact with Patient 06/26/18 0820     (approximate)  I have reviewed the triage vital signs and the nursing notes.   HISTORY  Chief Complaint No chief complaint on file.    HPI Kendra [name] is a 27 y.o. female with a history of substance abuse presents the ER requesting help with assistance with detoxing from heroin.  Patient states that she been using more frequently for the past 3 months.  States her last use was yesterday.  States she is feeling achy and that she is becoming "dope sick".  Patient denies any chest pain or shortness of breath.  Feels like she has low-grade fevers which improved after she drinks fluid.    Past Medical History:  Diagnosis Date  . Anxiety   . Depression   . Headache    stress  . History of cannabis abuse   . Seizures (HCC)    age 89. syncopal episode. Hit head. had seizure.   . Tobacco abuse    Family History  Problem Relation Age of Onset  . Diabetes Maternal Grandfather   . Diverticulitis Maternal Grandmother    Past Surgical History:  Procedure Laterality Date  . COLON SURGERY  2013   pt states "colonoscopy while awake"  . COLONOSCOPY WITH PROPOFOL N/A 12/14/2015   Procedure: COLONOSCOPY WITH PROPOFOL;  Surgeon: Midge Minium, MD;  Location: [facility];  Service: Endoscopy;  Laterality: N/A;  . ESOPHAGOGASTRODUODENOSCOPY (EGD) WITH PROPOFOL N/A 12/14/2015   Procedure: ESOPHAGOGASTRODUODENOSCOPY (EGD) WITH PROPOFOL;  Surgeon: Midge Minium, MD;  Location: [facility];  Service: Endoscopy;  Laterality: N/A;  . WISDOM TOOTH EXTRACTION  2013   Patient Active Problem List   Diagnosis Date Noted  . Noninfectious diarrhea   . Non-intractable cyclical vomiting with nausea   . Diarrhea   . Anxiety and depression 01/19/2015  . History of cannabis dependence/abuse (HCC) 01/19/2015  . Tobacco abuse 01/19/2015      Prior to  Admission medications   Medication Sig Start Date End Date Taking? Authorizing Provider  dicyclomine (BENTYL) 10 MG capsule Take 1 capsule (10 mg total) by mouth 4 (four) times daily. 12/04/15 12/18/15  Loleta Rose, MD  dicyclomine (BENTYL) 20 MG tablet Take 1 tablet (20 mg total) by mouth 4 (four) times daily -  before meals and at bedtime. 12/07/15   Midge Minium, MD  loperamide (IMODIUM A-D) 2 MG tablet Take 2 tablets (4 mg total) by mouth 4 (four) times daily as needed for diarrhea or loose stools. Patient not taking: Reported on 12/14/2015 12/06/15   Sharman Cheek, MD  LORazepam (ATIVAN) 1 MG tablet Take 1 mg by mouth every 8 (eight) hours as needed for anxiety.    [provider]  metroNIDAZOLE (FLAGYL) 500 MG tablet Take 500 mg by mouth 2 (two) times daily.    [provider]  nitrofurantoin (MACRODANTIN) 100 MG capsule Take 100 mg by mouth 2 (two) times daily.    [provider]  ondansetron (ZOFRAN ODT) 4 MG disintegrating tablet Take 1 tablet (4 mg total) by mouth every 8 (eight) hours as needed for nausea or vomiting. Patient not taking: Reported on 12/12/2015 12/06/15   Sharman Cheek, MD  ondansetron (ZOFRAN) 4 MG tablet Take 1 tablet (4 mg total) by mouth daily as needed for nausea or vomiting. 06/26/18 06/26/19  [name], MD  polyethylene glycol-electrolytes (TRILYTE) 420 g solution Take 4,000 mLs  by mouth as directed. Drink one 8 oz glass every 20 mins until stools are clear. 12/12/15   Midge Minium, MD  promethazine (PHENERGAN) 25 MG suppository Place 1 suppository (25 mg total) rectally every 6 (six) hours as needed for nausea or vomiting. 12/07/15   Midge Minium, MD  ranitidine (ZANTAC) 150 MG capsule Take 1 capsule (150 mg total) by mouth 2 (two) times daily. Patient not taking: Reported on 12/12/2015 12/06/15   Sharman Cheek, MD  sertraline (ZOLOFT) 100 MG tablet Take 1 tablet (100 mg total) by mouth daily. Patient not taking: Reported on  12/12/2015 02/07/15   [name], MD    Allergies Penicillins    Social History Social History   Tobacco Use  . Smoking status: Current Every Day Smoker    Packs/day: 1.00    Years: 8.00    Pack years: 8.00    Types: Cigarettes  Substance Use Topics  . Alcohol use: No  . Drug use: Yes    Types: Marijuana    Review of Systems Patient denies headaches, rhinorrhea, blurry vision, numbness, shortness of breath, chest pain, edema, cough, abdominal pain, nausea, vomiting, diarrhea, dysuria, fevers, rashes or hallucinations unless otherwise stated above in HPI. ____________________________________________   PHYSICAL EXAM:  VITAL SIGNS: Vitals:   06/26/18 0804  BP: (!) 136/95  Pulse: (!) 108  Resp: 20  Temp: 98.1 F (36.7 C)  SpO2: 95%    Constitutional: Alert and oriented. Non toxic appearing, ambulating with a steady gait  Eyes: Conjunctivae are normal.  Head: Atraumatic. Nose: No congestion/rhinnorhea. Mouth/Throat: Mucous membranes are moist.   Neck: No stridor. Painless ROM.  Cardiovascular: Normal rate, regular rhythm. Grossly normal heart sounds.  Good peripheral circulation. Respiratory: Normal respiratory effort.  No retractions. Lungs CTAB. Gastrointestinal: Soft and nontender. No distention. No abdominal bruits. No CVA tenderness. Genitourinary:  Musculoskeletal: No lower extremity tenderness nor edema.  No joint effusions. Neurologic:  Normal speech and language. No gross focal neurologic deficits are appreciated.  Skin:  Skin is warm, dry and intact. Psychiatric: Mood and affect are normal. Speech and behavior are normal.  ____________________________________________   LABS (all labs ordered are listed, but only abnormal results are displayed)  Results for orders placed or performed during the hospital encounter of 06/26/18 (from the past 24 hour(s))  Urinalysis, Complete w Microscopic     Status: Abnormal   Collection Time: 06/26/18  8:45 AM    Result Value Ref Range   Color, Urine AMBER (A) YELLOW   APPearance CLOUDY (A) CLEAR   Specific Gravity, Urine 1.030 1.005 - 1.030   pH 5.0 5.0 - 8.0   Glucose, UA NEGATIVE NEGATIVE mg/dL   Hgb urine dipstick NEGATIVE NEGATIVE   Bilirubin Urine NEGATIVE NEGATIVE   Ketones, ur NEGATIVE NEGATIVE mg/dL   Protein, ur 30 (A) NEGATIVE mg/dL   Nitrite POSITIVE (A) NEGATIVE   Leukocytes, UA TRACE (A) NEGATIVE   RBC / HPF 0-5 0 - 5 RBC/hpf   WBC, UA 11-20 0 - 5 WBC/hpf   Bacteria, UA MANY (A) NONE SEEN   Squamous Epithelial / LPF 11-20 0 - 5   Mucus PRESENT   Pregnancy, urine POC     Status: None   Collection Time: 06/26/18  8:51 AM  Result Value Ref Range   Preg Test, Ur NEGATIVE NEGATIVE   ____________________________________________ ________________________   PROCEDURES  Procedure(s) performed:  Procedures    Critical Care performed: no ____________________________________________   INITIAL IMPRESSION / ASSESSMENT AND PLAN / ED  COURSE  Pertinent labs & imaging results that were available during my care of the patient were reviewed by me and considered in my medical decision making (see chart for details).   DDX: withdrawal, polysubstance abuse, heroin dependence  Jordan Gaetano is a 27 y.o. who presents to the ED with substance abuse issues as described above.  Patient nontoxic-appearing appropriate and interactive.  Patient previously had successful detox with Suboxone will give low-dose Suboxone here in the ER to help with withdrawal syndrome.  She was complaining of some backache will check urine.  She is not pregnant.  No SI or HI.  UA does show evidence of UTI.  Will give keflex.  Stable for outpatient follow-up.        As part of my medical decision making, I reviewed the following data within the electronic MEDICAL RECORD NUMBER Nursing notes reviewed and incorporated, Labs reviewed, notes from prior ED  visits.   ____________________________________________   FINAL CLINICAL IMPRESSION(S) / ED DIAGNOSES  Final diagnoses:  Heroin dependence (HCC)  Acute cystitis without hematuria      NEW MEDICATIONS STARTED DURING THIS VISIT:  New Prescriptions   ONDANSETRON (ZOFRAN) 4 MG TABLET    Take 1 tablet (4 mg total) by mouth daily as needed for nausea or vomiting.     Note:  This document was prepared using Dragon voice recognition software and may include unintentional dictation errors.    [name], MD 06/26/18 1610    [name], MD 06/26/18 [phone]

## 2018-06-26 NOTE — ED Notes (Signed)
Per Dr. Roxan Hockey, no blood work or urine or dress out at this time. Pt ambulated to 19H. Father and fiance asked to step out to lobby.

## 2018-06-26 NOTE — ED Triage Notes (Addendum)
Here requesting detox from heroin. Last use 2 days ago. A&O, ambulatory. Father and fiance with pt. Hx anxiety. Anxious in triage, tearful.

## 2018-06-26 NOTE — ED Notes (Signed)
Pt states she came to the hospital for help with detox from heroine.  Pt states her last use was 1 day ago. Pt reports she has been through detox in the past, but has relapsed.  Father and fiance are with her for support (in lobby). Pt states she is scared, but denies SI. Elevated anxiety.  Maintained on 15 minute checks and observation by security for safety.

## 2018-06-26 NOTE — ED Notes (Signed)
Pt complaining of nausea, stomach pain and sweating. "I just feel worse. I don't know how to explain it."  EDP made aware.  Medications will be administered as ordered.   Maintained on 15 minute checks and observation by security for safety.

## 2018-06-26 NOTE — BH Assessment (Signed)
Per the request of the patient's nurse (Amy B.), writer provided her with resources for detox.  Patient denies SI/HI and AV/H.  ___________ [facility] [address],  [place], [place] [address] [phone]  [facility] [address] [place] [phone]

## 2018-06-26 NOTE — ED Notes (Signed)
Pt to be discharged home. TTS has given patient referrals for detox facilities.  VS stable. Medications administered as ordered.  Maintained on 15 minute checks and observation by security for safety.

## 2018-06-26 NOTE — ED Notes (Signed)
Pt discharged home with prescriptions and referrals to detox facilities. VS stable.  Discharge paperwork reviewed with patient and patient's father. Pt denies SI.

## 2019-07-06 ENCOUNTER — Other Ambulatory Visit: Payer: Self-pay | Admitting: Physician Assistant

## 2019-07-06 ENCOUNTER — Ambulatory Visit: Payer: Self-pay

## 2019-07-06 ENCOUNTER — Other Ambulatory Visit: Payer: Self-pay

## 2019-07-06 ENCOUNTER — Ambulatory Visit: Payer: Self-pay | Admitting: *Deleted

## 2019-07-06 DIAGNOSIS — M79641 Pain in right hand: Secondary | ICD-10-CM

## 2019-07-06 DIAGNOSIS — S6991XA Unspecified injury of right wrist, hand and finger(s), initial encounter: Secondary | ICD-10-CM

## 2019-07-06 NOTE — Progress Notes (Signed)
Pt brought to clinic by supervisor. They report that pt caught her hand between a cart and a large support column in the warehouse just prior to arrival to the clinic. RN noted moderate swelling over 4th and 5th metacarpals. Slight vertical bruising noted along 5th metacarpal area. Firm lump noted over 4th metacarpal just proximal to MCP. No crepitus noted upon exam.   Ice pack applied to area. Pt has taken no pain meds today per her report. Sts she only takes an anxiety med daily at home. Provided her with bottle of water and 650mg  Acetaminophen from clinic OTC stock. She took in clinic.  Recommended eval at [facility] in [place] to r/o fracture and obtain brace if needed and work restrictions if indicated. Pt sent to clinic with directions, treatment authorization form, job description. Supervisor brought pt's personal belongings to clinic and she took these with her when she left for the [facility] clinic.

## 2019-09-22 DIAGNOSIS — L03211 Cellulitis of face: Secondary | ICD-10-CM | POA: Diagnosis not present

## 2019-09-22 DIAGNOSIS — L03811 Cellulitis of head [any part, except face]: Secondary | ICD-10-CM | POA: Diagnosis not present

## 2019-09-22 DIAGNOSIS — R300 Dysuria: Secondary | ICD-10-CM | POA: Diagnosis not present

## 2019-11-07 ENCOUNTER — Other Ambulatory Visit: Payer: Self-pay

## 2019-11-07 ENCOUNTER — Emergency Department
Admission: EM | Admit: 2019-11-07 | Discharge: 2019-11-07 | Disposition: A | Discharge status: Home / Self Care | Payer: Medicaid Other | Attending: Emergency Medicine | Admitting: Emergency Medicine

## 2019-11-07 ENCOUNTER — Emergency Department: Payer: Medicaid Other

## 2019-11-07 DIAGNOSIS — N8301 Follicular cyst of right ovary: Secondary | ICD-10-CM | POA: Diagnosis not present

## 2019-11-07 DIAGNOSIS — F17210 Nicotine dependence, cigarettes, uncomplicated: Secondary | ICD-10-CM | POA: Diagnosis not present

## 2019-11-07 DIAGNOSIS — F424 Excoriation (skin-picking) disorder: Secondary | ICD-10-CM | POA: Insufficient documentation

## 2019-11-07 DIAGNOSIS — R102 Pelvic and perineal pain: Secondary | ICD-10-CM

## 2019-11-07 DIAGNOSIS — R079 Chest pain, unspecified: Secondary | ICD-10-CM | POA: Diagnosis not present

## 2019-11-07 DIAGNOSIS — N83201 Unspecified ovarian cyst, right side: Secondary | ICD-10-CM | POA: Diagnosis not present

## 2019-11-07 DIAGNOSIS — R0789 Other chest pain: Secondary | ICD-10-CM | POA: Diagnosis not present

## 2019-11-07 LAB — URINALYSIS, COMPLETE (UACMP) WITH MICROSCOPIC
Bilirubin Urine: NEGATIVE
Glucose, UA: NEGATIVE mg/dL
Hgb urine dipstick: NEGATIVE
Ketones, ur: NEGATIVE mg/dL
Leukocytes,Ua: NEGATIVE
Nitrite: NEGATIVE
Protein, ur: NEGATIVE mg/dL
Specific Gravity, Urine: 1.021 (ref 1.005–1.030)
pH: 6 (ref 5.0–8.0)

## 2019-11-07 LAB — BASIC METABOLIC PANEL
Anion gap: 6 (ref 5–15)
BUN: 10 mg/dL (ref 6–20)
CO2: 26 mmol/L (ref 22–32)
Calcium: 9.1 mg/dL (ref 8.9–10.3)
Chloride: 106 mmol/L (ref 98–111)
Creatinine, Ser: 1.04 mg/dL — ABNORMAL HIGH (ref 0.44–1.00)
GFR calc Af Amer: >60 mL/min (ref >60)
GFR calc non Af Amer: >60 mL/min (ref >60)
Glucose, Bld: 81 mg/dL (ref 70–99)
Potassium: 3.9 mmol/L (ref 3.5–5.1)
Sodium: 138 mmol/L (ref 135–145)

## 2019-11-07 LAB — CBC
HCT: 40.2 % (ref 36.0–46.0)
Hemoglobin: 13.5 g/dL (ref 12.0–15.0)
MCH: 35 pg — ABNORMAL HIGH (ref 26.0–34.0)
MCHC: 33.6 g/dL (ref 30.0–36.0)
MCV: 104.1 fL — ABNORMAL HIGH (ref 80.0–100.0)
Platelets: 279 10*3/uL (ref 150–400)
RBC: 3.86 MIL/uL — ABNORMAL LOW (ref 3.87–5.11)
RDW: 12.6 % (ref 11.5–15.5)
WBC: 10.6 10*3/uL — ABNORMAL HIGH (ref 4.0–10.5)
nRBC: 0 % (ref 0.0–0.2)

## 2019-11-07 LAB — POCT PREGNANCY, URINE: Preg Test, Ur: NEGATIVE

## 2019-11-07 MED ORDER — LORAZEPAM 1 MG PO TABS
1.0000 mg | ORAL_TABLET | Freq: Once | ORAL | Status: AC
  Administered 2019-11-07: 1 mg via ORAL
  Filled 2019-11-07: qty 1

## 2019-11-07 NOTE — ED Provider Notes (Signed)
[facility] Emergency Department Provider Note   ____________________________________________   First MD Initiated Contact with Patient 11/07/19 1956     (approximate)  I have reviewed the triage vital signs and the nursing notes.   HISTORY  Chief Complaint Pelvic Pain and Facial Swelling    HPI Kendra JAGIELLO is a 29 y.o. female with possible history of anxiety presents to the ED complaining of abdominal pain and facial swelling.  Patient reports that she has been having pain on both sides of her pelvic area intermittently for the past couple of days.  She describes it as crampy and not exacerbated or alleviated by anything in particular.  She denies any associated dysuria, hematuria, vaginal bleeding, or vaginal discharge.  She does admit to a history of ovarian cysts with similar pain in the past.  Additionally, she complains of excoriations to the right side of her face.  She admits to picking at her face frequently, especially where she has breakouts of acne, due to her anxiety.  She is now concerned that these areas may have become infected as they seem slightly more swollen.  She denies any surrounding redness or drainage from these areas.        Past Medical History:  Diagnosis Date  . Anxiety   . Depression   . Headache    stress  . History of cannabis abuse   . Seizures ([place])    age 53. syncopal episode. Hit head. had seizure.   . Tobacco abuse     Patient Active Problem List   Diagnosis Date Noted  . Noninfectious diarrhea   . Non-intractable cyclical vomiting with nausea   . Diarrhea   . Anxiety and depression 01/19/2015  . History of cannabis dependence/abuse ([place]) 01/19/2015  . Tobacco abuse 01/19/2015    Past Surgical History:  Procedure Laterality Date  . COLON SURGERY  2013   pt states "colonoscopy while awake"  . COLONOSCOPY WITH PROPOFOL N/A 12/14/2015   Procedure: COLONOSCOPY WITH PROPOFOL;  Surgeon: Lucilla Lame, MD;   Location: [place];  Service: Endoscopy;  Laterality: N/A;  . ESOPHAGOGASTRODUODENOSCOPY (EGD) WITH PROPOFOL N/A 12/14/2015   Procedure: ESOPHAGOGASTRODUODENOSCOPY (EGD) WITH PROPOFOL;  Surgeon: Lucilla Lame, MD;  Location: [place];  Service: Endoscopy;  Laterality: N/A;  . WISDOM TOOTH EXTRACTION  2013    Prior to Admission medications   Medication Sig Start Date End Date Taking? Authorizing Provider  dicyclomine (BENTYL) 10 MG capsule Take 1 capsule (10 mg total) by mouth 4 (four) times daily. 12/04/15 12/18/15  [name], MD  dicyclomine (BENTYL) 20 MG tablet Take 1 tablet (20 mg total) by mouth 4 (four) times daily -  before meals and at bedtime. 12/07/15   Lucilla Lame, MD  loperamide (IMODIUM A-D) 2 MG tablet Take 2 tablets (4 mg total) by mouth 4 (four) times daily as needed for diarrhea or loose stools. Patient not taking: Reported on 12/14/2015 12/06/15   Carrie Mew, MD  LORazepam (ATIVAN) 1 MG tablet Take 1 mg by mouth every 8 (eight) hours as needed for anxiety.    [provider]  metroNIDAZOLE (FLAGYL) 500 MG tablet Take 500 mg by mouth 2 (two) times daily.    [provider]  nitrofurantoin (MACRODANTIN) 100 MG capsule Take 100 mg by mouth 2 (two) times daily.    [provider]  ondansetron (ZOFRAN ODT) 4 MG disintegrating tablet Take 1 tablet (4 mg total) by mouth every 8 (eight) hours as  needed for nausea or vomiting. Patient not taking: Reported on 12/12/2015 12/06/15   Sharman Cheek, MD  polyethylene glycol-electrolytes (TRILYTE) 420 g solution Take 4,000 mLs by mouth as directed. Drink one 8 oz glass every 20 mins until stools are clear. 12/12/15   Midge Minium, MD  promethazine (PHENERGAN) 25 MG suppository Place 1 suppository (25 mg total) rectally every 6 (six) hours as needed for nausea or vomiting. 12/07/15   Midge Minium, MD  promethazine (PHENERGAN) 25 MG tablet Take 0.5 tablets (12.5 mg total) by mouth every 6 (six)  hours as needed for nausea or vomiting. 06/26/18   [name], MD  ranitidine (ZANTAC) 150 MG capsule Take 1 capsule (150 mg total) by mouth 2 (two) times daily. Patient not taking: Reported on 12/12/2015 12/06/15   Sharman Cheek, MD  sertraline (ZOLOFT) 100 MG tablet Take 1 tablet (100 mg total) by mouth daily. Patient not taking: Reported on 12/12/2015 02/07/15   [name], MD    Allergies Penicillins  Family History  Problem Relation Age of Onset  . Diabetes Maternal Grandfather   . Diverticulitis Maternal Grandmother     Social History Social History   Tobacco Use  . Smoking status: Current Every Day Smoker    Packs/day: 1.00    Years: 8.00    Pack years: 8.00    Types: Cigarettes  Substance Use Topics  . Alcohol use: No  . Drug use: Yes    Types: Marijuana    Review of Systems  Constitutional: No fever/chills Eyes: No visual changes. ENT: No sore throat. Cardiovascular: Denies chest pain. Respiratory: Denies shortness of breath. Gastrointestinal: Positive for abdominal pain.  No nausea, no vomiting.  No diarrhea.  No constipation. Genitourinary: Negative for dysuria. Musculoskeletal: Negative for back pain. Skin: Positive for rash. Neurological: Negative for headaches, focal weakness or numbness.  ____________________________________________   PHYSICAL EXAM:  VITAL SIGNS: ED Triage Vitals [11/07/19 1435]  Enc Vitals Group     BP 101/81     Pulse Rate 83     Resp 18     Temp 98.2 F (36.8 C)     Temp Source Oral     SpO2 98 %     Weight 150 lb (68 kg)     Height 5\' 7"  (1.702 m)     Head Circumference      Peak Flow      Pain Score 0     Pain Loc      Pain Edu?      Excl. in GC?     Constitutional: Alert and oriented. Eyes: Conjunctivae are normal. Head: Atraumatic. Nose: No congestion/rhinnorhea. Mouth/Throat: Mucous membranes are moist. Neck: Normal ROM Cardiovascular: Normal rate, regular rhythm. Grossly normal heart  sounds. Respiratory: Normal respiratory effort.  No retractions. Lungs CTAB. Gastrointestinal: Soft and nontender. No distention. Genitourinary: deferred Musculoskeletal: No lower extremity tenderness nor edema. Neurologic:  Normal speech and language. No gross focal neurologic deficits are appreciated. Skin:  Skin is warm, dry and intact.  Multiple excoriations to right lower face with no surrounding erythema, warmth, drainage, or edema. Psychiatric: Mood and affect are normal. Speech and behavior are normal.  ____________________________________________   LABS (all labs ordered are listed, but only abnormal results are displayed)  Labs Reviewed  URINALYSIS, COMPLETE (UACMP) WITH MICROSCOPIC - Abnormal; Notable for the following components:      Result Value   Color, Urine YELLOW (*)    APPearance HAZY (*)    Bacteria, UA RARE (*)  All other components within normal limits  CBC - Abnormal; Notable for the following components:   WBC 10.6 (*)    RBC 3.86 (*)    MCV 104.1 (*)    MCH 35.0 (*)    All other components within normal limits  BASIC METABOLIC PANEL - Abnormal; Notable for the following components:   Creatinine, Ser 1.04 (*)    All other components within normal limits  POC URINE PREG, ED  POCT PREGNANCY, URINE   ____________________________________________  EKG  ED ECG REPORT I, Chesley Noon, the attending physician, personally viewed and interpreted this ECG.   Date: 11/08/2019  EKG Time: 20:50  Rate: 62  Rhythm: normal sinus rhythm  Axis: Normal  Intervals:none  ST&T Change: None   PROCEDURES  Procedure(s) performed (including Critical Care):  Procedures   ____________________________________________   INITIAL IMPRESSION / ASSESSMENT AND PLAN / ED COURSE       29 year old female with history of anxiety presents to the ED complaining of bilateral pelvic pain intermittently for the past couple of days.  She has no focal abdominal tenderness  and declined pelvic exam.  Pregnancy testing is negative and UA shows no evidence of infection.  Ultrasound was obtained and shows ovarian cyst, likely contributing to patient's pain.  There is no evidence of torsion and remainder of ultrasound is negative.  As far as her facial excoriations, these do not appear acutely infected.  I have advised her to use over-the-counter bacitracin to encourage healing and to avoid picking at these areas.    After initial evaluation, she complained of central chest pain and tightness, similar to prior panic attacks.  EKG shows no evidence of arrhythmia or ischemia and her chest pain appears due to anxiety.  She was given dose of Ativan with improvement in chest pain and is appropriate for discharge home.  She was counseled to follow-up with her PCP and to return to the ED for new or worsening symptoms.  Patient agrees with plan.      ____________________________________________   FINAL CLINICAL IMPRESSION(S) / ED DIAGNOSES  Final diagnoses:  Pelvic pain  Cyst of right ovary  Skin picking habit  Chest pain, unspecified type     ED Discharge Orders    None       Note:  This document was prepared using Dragon voice recognition software and may include unintentional dictation errors.   Chesley Noon, MD 11/08/19 [phone]

## 2019-11-07 NOTE — ED Triage Notes (Signed)
Pt states she has R sided facial swelling from "picking at my face from anxiety." states she thinks she has an infection, slight swelling noted. Pt took ibuprofen. also c/o of L ovary pain. Denies pain during intercourse. Denies vaginal bleeding or discharge. States pain comes and goes, feels like cramp. Hx of cyst.   A&O, ambulatory. No distress noted.

## 2019-11-07 NOTE — ED Notes (Signed)
ED Provider at bedside. 

## 2020-03-01 DIAGNOSIS — Z23 Encounter for immunization: Secondary | ICD-10-CM | POA: Diagnosis not present

## 2020-04-01 DIAGNOSIS — Z23 Encounter for immunization: Secondary | ICD-10-CM | POA: Diagnosis not present

## 2020-08-18 DIAGNOSIS — Z3046 Encounter for surveillance of implantable subdermal contraceptive: Secondary | ICD-10-CM | POA: Diagnosis not present

## 2020-09-27 DIAGNOSIS — R1084 Generalized abdominal pain: Secondary | ICD-10-CM | POA: Diagnosis not present

## 2020-09-27 DIAGNOSIS — J020 Streptococcal pharyngitis: Secondary | ICD-10-CM | POA: Diagnosis not present

## 2020-09-27 DIAGNOSIS — R52 Pain, unspecified: Secondary | ICD-10-CM | POA: Diagnosis not present

## 2020-10-25 ENCOUNTER — Emergency Department: Payer: Medicaid Other

## 2020-10-25 ENCOUNTER — Other Ambulatory Visit: Payer: Self-pay

## 2020-10-25 DIAGNOSIS — J40 Bronchitis, not specified as acute or chronic: Secondary | ICD-10-CM | POA: Diagnosis not present

## 2020-10-25 DIAGNOSIS — F17210 Nicotine dependence, cigarettes, uncomplicated: Secondary | ICD-10-CM | POA: Insufficient documentation

## 2020-10-25 DIAGNOSIS — R0602 Shortness of breath: Secondary | ICD-10-CM | POA: Diagnosis not present

## 2020-10-25 DIAGNOSIS — R059 Cough, unspecified: Secondary | ICD-10-CM | POA: Diagnosis not present

## 2020-10-25 DIAGNOSIS — R0789 Other chest pain: Secondary | ICD-10-CM | POA: Diagnosis not present

## 2020-10-25 DIAGNOSIS — R109 Unspecified abdominal pain: Secondary | ICD-10-CM | POA: Diagnosis not present

## 2020-10-25 DIAGNOSIS — R079 Chest pain, unspecified: Secondary | ICD-10-CM | POA: Diagnosis not present

## 2020-10-25 DIAGNOSIS — B349 Viral infection, unspecified: Secondary | ICD-10-CM | POA: Diagnosis not present

## 2020-10-25 DIAGNOSIS — R531 Weakness: Secondary | ICD-10-CM | POA: Diagnosis not present

## 2020-10-25 LAB — CBC
HCT: 43.5 % (ref 36.0–46.0)
Hemoglobin: 14.7 g/dL (ref 12.0–15.0)
MCH: 33.9 pg (ref 26.0–34.0)
MCHC: 33.8 g/dL (ref 30.0–36.0)
MCV: 100.5 fL — ABNORMAL HIGH (ref 80.0–100.0)
Platelets: 353 10*3/uL (ref 150–400)
RBC: 4.33 MIL/uL (ref 3.87–5.11)
RDW: 12.1 % (ref 11.5–15.5)
WBC: 12.2 10*3/uL — ABNORMAL HIGH (ref 4.0–10.5)
nRBC: 0 % (ref 0.0–0.2)

## 2020-10-25 LAB — COMPREHENSIVE METABOLIC PANEL
ALT: 12 U/L (ref 0–44)
AST: 16 U/L (ref 15–41)
Albumin: 4.3 g/dL (ref 3.5–5.0)
Alkaline Phosphatase: 68 U/L (ref 38–126)
Anion gap: 11 (ref 5–15)
BUN: 11 mg/dL (ref 6–20)
CO2: 21 mmol/L — ABNORMAL LOW (ref 22–32)
Calcium: 9.5 mg/dL (ref 8.9–10.3)
Chloride: 106 mmol/L (ref 98–111)
Creatinine, Ser: 0.84 mg/dL (ref 0.44–1.00)
GFR, Estimated: >60 mL/min (ref >60)
Glucose, Bld: 109 mg/dL — ABNORMAL HIGH (ref 70–99)
Potassium: 3.9 mmol/L (ref 3.5–5.1)
Sodium: 138 mmol/L (ref 135–145)
Total Bilirubin: 0.6 mg/dL (ref 0.3–1.2)
Total Protein: 8.1 g/dL (ref 6.5–8.1)

## 2020-10-25 LAB — TROPONIN I (HIGH SENSITIVITY): Troponin I (High Sensitivity): 3 ng/L (ref <18)

## 2020-10-25 NOTE — ED Triage Notes (Signed)
Pt in with co chest pain that radiates to left upper back and mid back. STates started 1 month ago but today having increased weakness.

## 2020-10-26 ENCOUNTER — Emergency Department
Admission: EM | Admit: 2020-10-26 | Discharge: 2020-10-26 | Disposition: A | Discharge status: Home / Self Care | Payer: Medicaid Other | Attending: Emergency Medicine | Admitting: Emergency Medicine

## 2020-10-26 DIAGNOSIS — J40 Bronchitis, not specified as acute or chronic: Secondary | ICD-10-CM

## 2020-10-26 LAB — TROPONIN I (HIGH SENSITIVITY): Troponin I (High Sensitivity): <2 ng/L (ref <18)

## 2020-10-26 MED ORDER — PREDNISONE 20 MG PO TABS
60.0000 mg | ORAL_TABLET | Freq: Once | ORAL | Status: AC
  Administered 2020-10-26: 60 mg via ORAL
  Filled 2020-10-26: qty 3

## 2020-10-26 MED ORDER — AZITHROMYCIN 500 MG PO TABS
500.0000 mg | ORAL_TABLET | Freq: Once | ORAL | Status: AC
  Administered 2020-10-26: 500 mg via ORAL
  Filled 2020-10-26: qty 1

## 2020-10-26 MED ORDER — PREDNISONE 20 MG PO TABS: 60.0000 mg | ORAL_TABLET | Freq: Every day | ORAL | 0 refills | Status: AC

## 2020-10-26 MED ORDER — AZITHROMYCIN 250 MG PO TABS: ORAL_TABLET | ORAL | 0 refills | Status: DC

## 2020-10-26 MED ORDER — IPRATROPIUM-ALBUTEROL 0.5-2.5 (3) MG/3ML IN SOLN
3.0000 mL | Freq: Once | RESPIRATORY_TRACT | Status: AC
  Administered 2020-10-26: 3 mL via RESPIRATORY_TRACT
  Filled 2020-10-26: qty 3

## 2020-10-26 MED ORDER — ALBUTEROL SULFATE HFA 108 (90 BASE) MCG/ACT IN AERS: 2.0000 | INHALATION_SPRAY | Freq: Four times a day (QID) | RESPIRATORY_TRACT | 2 refills | Status: DC | PRN

## 2020-10-26 NOTE — ED Provider Notes (Signed)
[facility] Emergency Department Provider Note  ____________________________________________  Time seen: Approximately 1:40 AM  I have reviewed the triage vital signs and the nursing notes.   HISTORY  Chief Complaint Chest Pain   HPI Kendra [name] is a 30 y.o. female the history of smoking, anxiety and depression who presents for evaluation of chest pain.  Patient reports that she has had a chest cold  for the last few days and has been having intermittent episodes which she describes as chest tightness.  She feels pressure in the center of her chest going all the way to her back associated with wheezing and shortness of breath.  She has anxiety and reports that when she feels like that she gets pretty anxious and sometimes feels dizzy like she is going to pass out.  She denies any personal or family history of PE or DVT, recent travel immobilization, leg pain or swelling, hemoptysis.  She denies pleuritic chest pain.  She reports these episodes of chest pain usually lasts about 20 minutes and resolved without intervention.  She denies any constant chest pain.  No fever chills, no abdominal pain, no vomiting or diarrhea.  Past Medical History:  Diagnosis Date  . Anxiety   . Depression   . Headache    stress  . History of cannabis abuse   . Seizures ([facility])    age 82. syncopal episode. Hit head. had seizure.   . Tobacco abuse     Patient Active Problem List   Diagnosis Date Noted  . Noninfectious diarrhea   . Non-intractable cyclical vomiting with nausea   . Diarrhea   . Anxiety and depression 01/19/2015  . History of cannabis dependence/abuse ([facility]) 01/19/2015  . Tobacco abuse 01/19/2015    Past Surgical History:  Procedure Laterality Date  . COLON SURGERY  2013   pt states "colonoscopy while awake"  . COLONOSCOPY WITH PROPOFOL N/A 12/14/2015   Procedure: COLONOSCOPY WITH PROPOFOL;  Surgeon: Midge Minium, MD;  [facility];   Service: Endoscopy;  Laterality: N/A;  . ESOPHAGOGASTRODUODENOSCOPY (EGD) WITH PROPOFOL N/A 12/14/2015   Procedure: ESOPHAGOGASTRODUODENOSCOPY (EGD) WITH PROPOFOL;  Surgeon: Midge Minium, MD;  Location: [facility];  Service: Endoscopy;  Laterality: N/A;  . WISDOM TOOTH EXTRACTION  2013    Prior to Admission medications   Medication Sig Start Date End Date Taking? Authorizing Provider  albuterol (VENTOLIN HFA) 108 (90 Base) MCG/ACT inhaler Inhale 2 puffs into the lungs every 6 (six) hours as needed for wheezing or shortness of breath. 10/26/20  Yes Don Perking, [place], MD  azithromycin [facility]) 250 MG tablet Take 1 a day for 4 days 10/26/20  Yes Don Perking, [place], MD  predniSONE (DELTASONE) 20 MG tablet Take 3 tablets (60 mg total) by mouth daily with breakfast for 4 days. 10/26/20 10/30/20 Yes Jake Fuhrmann, [place], MD  dicyclomine (BENTYL) 10 MG capsule Take 1 capsule (10 mg total) by mouth 4 (four) times daily. 12/04/15 12/18/15  Loleta Rose, MD  dicyclomine (BENTYL) 20 MG tablet Take 1 tablet (20 mg total) by mouth 4 (four) times daily -  before meals and at bedtime. 12/07/15   Midge Minium, MD  loperamide (IMODIUM A-D) 2 MG tablet Take 2 tablets (4 mg total) by mouth 4 (four) times daily as needed for diarrhea or loose stools. Patient not taking: Reported on 12/14/2015 12/06/15   Sharman Cheek, MD  LORazepam (ATIVAN) 1 MG tablet Take 1 mg by mouth every 8 (eight) hours as needed for anxiety.  [provider]  metroNIDAZOLE (FLAGYL) 500 MG tablet Take 500 mg by mouth 2 (two) times daily.    [provider]  nitrofurantoin (MACRODANTIN) 100 MG capsule Take 100 mg by mouth 2 (two) times daily.    [provider]  ondansetron (ZOFRAN ODT) 4 MG disintegrating tablet Take 1 tablet (4 mg total) by mouth every 8 (eight) hours as needed for nausea or vomiting. Patient not taking: Reported on 12/12/2015 12/06/15   Sharman Cheek, MD  polyethylene glycol-electrolytes  (TRILYTE) 420 g solution Take 4,000 mLs by mouth as directed. Drink one 8 oz glass every 20 mins until stools are clear. 12/12/15   Midge Minium, MD  promethazine (PHENERGAN) 25 MG suppository Place 1 suppository (25 mg total) rectally every 6 (six) hours as needed for nausea or vomiting. 12/07/15   Midge Minium, MD  promethazine (PHENERGAN) 25 MG tablet Take 0.5 tablets (12.5 mg total) by mouth every 6 (six) hours as needed for nausea or vomiting. 06/26/18   [name], MD  ranitidine (ZANTAC) 150 MG capsule Take 1 capsule (150 mg total) by mouth 2 (two) times daily. Patient not taking: Reported on 12/12/2015 12/06/15   Sharman Cheek, MD  sertraline (ZOLOFT) 100 MG tablet Take 1 tablet (100 mg total) by mouth daily. Patient not taking: Reported on 12/12/2015 02/07/15   [name], MD    Allergies Penicillins  Family History  Problem Relation Age of Onset  . Diabetes Maternal Grandfather   . Diverticulitis Maternal Grandmother     Social History Social History   Tobacco Use  . Smoking status: Current Every Day Smoker    Packs/day: 1.00    Years: 8.00    Pack years: 8.00    Types: Cigarettes  Substance Use Topics  . Alcohol use: No  . Drug use: Yes    Types: Marijuana    Review of Systems  Constitutional: Negative for fever. Eyes: Negative for visual changes. ENT: Negative for sore throat. Neck: No neck pain  Cardiovascular: Negative for chest pain. + chest tightness Respiratory: + shortness of breath and cough Gastrointestinal: Negative for abdominal pain, vomiting or diarrhea. Genitourinary: Negative for dysuria. Musculoskeletal: Negative for back pain. Skin: Negative for rash. Neurological: Negative for headaches, weakness or numbness. Psych: No SI or HI  ____________________________________________   PHYSICAL EXAM:  VITAL SIGNS: ED Triage Vitals  Enc Vitals Group     BP 10/25/20 2152 130/87     Pulse Rate 10/25/20 2152 93     Resp 10/25/20 2152 20      Temp 10/25/20 2152 98.3 F (36.8 C)     Temp Source 10/25/20 2152 Oral     SpO2 10/25/20 2152 100 %     Weight 10/25/20 2151 250 lb (113.4 kg)     Height 10/25/20 2151 5\' 7"  (1.702 m)     Head Circumference --      Peak Flow --      Pain Score 10/25/20 2151 6     Pain Loc --      Pain Edu? --      Excl. in GC? --     Constitutional: Alert and oriented. Well appearing and in no apparent distress. HEENT:      Head: Normocephalic and atraumatic.         Eyes: Conjunctivae are normal. Sclera is non-icteric.       Mouth/Throat: Mucous membranes are moist.       Neck: Supple with no signs of meningismus. Cardiovascular: Regular rate  and rhythm. No murmurs, gallops, or rubs. 2+ symmetrical distal pulses are present in all extremities. No JVD. Respiratory: Normal respiratory effort.  Breath sounds are diminished bilaterally with faint expiratory wheezes  gastrointestinal: Soft, non tender. Musculoskeletal:  No edema, cyanosis, or erythema of extremities. Neurologic: Normal speech and language. Face is symmetric. Moving all extremities. No gross focal neurologic deficits are appreciated. Skin: Skin is warm, dry and intact. No rash noted. Psychiatric: Mood and affect are normal. Speech and behavior are normal.  ____________________________________________   LABS (all labs ordered are listed, but only abnormal results are displayed)  Labs Reviewed  CBC - Abnormal; Notable for the following components:      Result Value   WBC 12.2 (*)    MCV 100.5 (*)    All other components within normal limits  COMPREHENSIVE METABOLIC PANEL - Abnormal; Notable for the following components:   CO2 21 (*)    Glucose, Bld 109 (*)    All other components within normal limits  TROPONIN I (HIGH SENSITIVITY)  TROPONIN I (HIGH SENSITIVITY)   ____________________________________________  EKG  ED ECG REPORT I, [name], the attending physician, personally viewed and interpreted this  ECG.  Sinus tachycardia, rate of 108, normal intervals, normal axis, diffuse T wave flattening, no ST elevations or depressions ____________________________________________  RADIOLOGY  I have personally reviewed the images performed during this visit and I agree with the Radiologist's read.   Interpretation by Radiologist:  DG Chest 2 View  Result Date: 10/25/2020 CLINICAL DATA:  Chest pain radiating to left upper and mid back, weakness EXAM: CHEST - 2 VIEW COMPARISON:  None. FINDINGS: Frontal and lateral views of the chest demonstrate an unremarkable cardiac silhouette. No airspace disease, effusion, or pneumothorax. No acute bony abnormalities. IMPRESSION: 1. No acute intrathoracic process. Electronically Signed   By: Sharlet Salina M.D.   On: 10/25/2020 22:39     ____________________________________________   PROCEDURES  Procedure(s) performed: None Procedures Critical Care performed:  None ____________________________________________   INITIAL IMPRESSION / ASSESSMENT AND PLAN / ED COURSE  30 y.o. female the history of smoking, anxiety and depression who presents for evaluation of chest tightness, cough, shortness of breath and wheezing.  Patient has normal work of breathing and normal sats but does have diminished breath sounds bilaterally with faint wheezing.  Chest x-ray with no signs of pneumonia, edema, pneumothorax, visualized by me confirmed by radiology.  EKG with no signs of acute ischemia or any changes consistent with pericarditis.  2 high-sensitivity troponins negative.  Presentation atypical for myocarditis or ACS.  She does have a minimally elevated white count of 12.2, cough but no fever.  After receiving 1 DuoNeb patient reports marked improvement of her symptoms with a solution of her chest tightness, she is moving good air and not wheezing.  At this time her presentation is consistent with bronchitis.  Will treat with a Z-Pak, albuterol and steroids.  Discussed  smoking cessation and recommended close follow-up with primary care doctor.  Discussed my standard return precautions.       _____________________________________________ Please note:  Patient was evaluated in Emergency Department today for the symptoms described in the history of present illness. Patient was evaluated in the context of the global COVID-19 pandemic, which necessitated consideration that the patient might be at risk for infection with the SARS-CoV-2 virus that causes COVID-19. Institutional protocols and algorithms that pertain to the evaluation of patients at risk for COVID-19 are in a state of rapid change based on information  released by regulatory bodies including the CDC and federal and state organizations. These policies and algorithms were followed during the patient's care in the ED.  Some ED evaluations and interventions may be delayed as a result of limited staffing during the pandemic.   [place] Controlled Substance Database was reviewed by me. ____________________________________________   FINAL CLINICAL IMPRESSION(S) / ED DIAGNOSES   Final diagnoses:  Bronchitis      NEW MEDICATIONS STARTED DURING THIS VISIT:  ED Discharge Orders         Ordered    albuterol (VENTOLIN HFA) 108 (90 Base) MCG/ACT inhaler  Every 6 hours PRN        10/26/20 0139    azithromycin (ZITHROMAX) 250 MG tablet        10/26/20 0139    predniSONE (DELTASONE) 20 MG tablet  Daily with breakfast        10/26/20 0139           Note:  This document was prepared using Dragon voice recognition software and may include unintentional dictation errors.    Don Perking, [place], MD 10/26/20 [phone]

## 2021-07-10 ENCOUNTER — Other Ambulatory Visit: Payer: Self-pay

## 2021-07-10 ENCOUNTER — Emergency Department
Admission: EM | Admit: 2021-07-10 | Discharge: 2021-07-10 | Disposition: A | Discharge status: Home / Self Care | Payer: Medicaid Other | Attending: Emergency Medicine | Admitting: Emergency Medicine

## 2021-07-10 DIAGNOSIS — F419 Anxiety disorder, unspecified: Secondary | ICD-10-CM | POA: Diagnosis not present

## 2021-07-10 DIAGNOSIS — F17210 Nicotine dependence, cigarettes, uncomplicated: Secondary | ICD-10-CM | POA: Diagnosis not present

## 2021-07-10 LAB — BASIC METABOLIC PANEL
Anion gap: 6 (ref 5–15)
BUN: 12 mg/dL (ref 6–20)
CO2: 23 mmol/L (ref 22–32)
Calcium: 9.5 mg/dL (ref 8.9–10.3)
Chloride: 109 mmol/L (ref 98–111)
Creatinine, Ser: 0.78 mg/dL (ref 0.44–1.00)
GFR, Estimated: >60 mL/min (ref >60)
Glucose, Bld: 106 mg/dL — ABNORMAL HIGH (ref 70–99)
Potassium: 3.9 mmol/L (ref 3.5–5.1)
Sodium: 138 mmol/L (ref 135–145)

## 2021-07-10 LAB — CBC
HCT: 44.7 % (ref 36.0–46.0)
Hemoglobin: 15.5 g/dL — ABNORMAL HIGH (ref 12.0–15.0)
MCH: 34.4 pg — ABNORMAL HIGH (ref 26.0–34.0)
MCHC: 34.7 g/dL (ref 30.0–36.0)
MCV: 99.3 fL (ref 80.0–100.0)
Platelets: 330 10*3/uL (ref 150–400)
RBC: 4.5 MIL/uL (ref 3.87–5.11)
RDW: 12.2 % (ref 11.5–15.5)
WBC: 9.2 10*3/uL (ref 4.0–10.5)
nRBC: 0 % (ref 0.0–0.2)

## 2021-07-10 LAB — T4, FREE: Free T4: 0.95 ng/dL (ref 0.61–1.12)

## 2021-07-10 LAB — TSH: TSH: 2.813 u[IU]/mL (ref 0.350–4.500)

## 2021-07-10 MED ORDER — HYDROXYZINE HCL 25 MG PO TABS: 25.0000 mg | ORAL_TABLET | Freq: Three times a day (TID) | ORAL | 0 refills | Status: DC | PRN

## 2021-07-10 MED ORDER — HYDROXYZINE HCL 25 MG PO TABS
25.0000 mg | ORAL_TABLET | Freq: Once | ORAL | Status: AC
  Administered 2021-07-10: 25 mg via ORAL
  Filled 2021-07-10: qty 1

## 2021-07-10 NOTE — Discharge Instructions (Signed)
Your lab tests, including thyroid panel, are all normal today.

## 2021-07-10 NOTE — ED Notes (Signed)
E-signature not working at this time. Pt verbalized understanding of D/C instructions, prescriptions and follow up care with no further questions at this time. Pt in NAD and ambulatory at time of D/C.  

## 2021-07-10 NOTE — ED Notes (Signed)
UA cup provided by Gabby,EDT.

## 2021-07-10 NOTE — ED Provider Notes (Addendum)
[facility] Emergency Department Provider Note  ____________________________________________  Time seen: Approximately 12:31 PM  I have reviewed the triage vital signs and the nursing notes.   HISTORY  Chief Complaint Anxiety    HPI Kendra [name] is a 30 y.o. 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  Also reports some weight gain and hair changes over the last year.  She is worried that her neck is swollen.  Denies fever or chills, no palpitations chest pain or shortness of breath.  No fever.  No vomiting or diarrhea.    Past Medical History:  Diagnosis Date   Anxiety    Depression    Headache    stress   History of cannabis abuse    Seizures (HCC)    age 74. syncopal episode. Hit head. had seizure.    Tobacco abuse      Patient Active Problem List   Diagnosis Date Noted   Noninfectious diarrhea    Non-intractable cyclical vomiting with nausea    Diarrhea    Anxiety and depression 01/19/2015   History of cannabis dependence/abuse (HCC) 01/19/2015   Tobacco abuse 01/19/2015     Past Surgical History:  Procedure Laterality Date   COLON SURGERY  2013   pt states "colonoscopy while awake"   COLONOSCOPY WITH PROPOFOL N/A 12/14/2015   Procedure: COLONOSCOPY WITH PROPOFOL;  Surgeon: Midge Minium, MD;  Location: [facility];  Service: Endoscopy;  Laterality: N/A;   ESOPHAGOGASTRODUODENOSCOPY (EGD) WITH PROPOFOL N/A 12/14/2015   Procedure: ESOPHAGOGASTRODUODENOSCOPY (EGD) WITH PROPOFOL;  Surgeon: Midge Minium, MD;  Location: [facility];  Service: Endoscopy;  Laterality: N/A;   WISDOM TOOTH EXTRACTION  2013     Prior to Admission medications   Medication Sig Start Date End Date Taking? Authorizing Provider  hydrOXYzine (ATARAX) 25 MG tablet Take 1 tablet (25 mg total) by mouth 3 (three) times daily  as needed for anxiety. 07/10/21  Yes Sharman Cheek, MD  albuterol (VENTOLIN HFA) 108 (90 Base) MCG/ACT inhaler Inhale 2 puffs into the lungs every 6 (six) hours as needed for wheezing or shortness of breath. 10/26/20   [name], MD  azithromycin [facility]) 250 MG tablet Take 1 a day for 4 days 10/26/20   Don Perking, [place], MD  dicyclomine (BENTYL) 10 MG capsule Take 1 capsule (10 mg total) by mouth 4 (four) times daily. 12/04/15 12/18/15  Loleta Rose, MD  dicyclomine (BENTYL) 20 MG tablet Take 1 tablet (20 mg total) by mouth 4 (four) times daily -  before meals and at bedtime. 12/07/15   Midge Minium, MD  loperamide (IMODIUM A-D) 2 MG tablet Take 2 tablets (4 mg total) by mouth 4 (four) times daily as needed for diarrhea or loose stools. Patient not taking: Reported on 12/14/2015 12/06/15   Sharman Cheek, MD  LORazepam (ATIVAN) 1 MG tablet Take 1 mg by mouth every 8 (eight) hours as needed for anxiety.    [provider]  metroNIDAZOLE (FLAGYL) 500 MG tablet Take 500 mg by mouth 2 (two) times daily.    [provider]  nitrofurantoin (MACRODANTIN) 100 MG capsule Take 100 mg by mouth 2 (two) times daily.    [provider]  ondansetron (ZOFRAN ODT) 4 MG disintegrating tablet Take 1 tablet (4 mg total) by mouth every 8 (eight) hours as needed for nausea or vomiting. Patient not taking: Reported on 12/12/2015 12/06/15  Sharman Cheek, MD  polyethylene glycol-electrolytes (TRILYTE) 420 g solution Take 4,000 mLs by mouth as directed. Drink one 8 oz glass every 20 mins until stools are clear. 12/12/15   Midge Minium, MD  promethazine (PHENERGAN) 25 MG suppository Place 1 suppository (25 mg total) rectally every 6 (six) hours as needed for nausea or vomiting. 12/07/15   Midge Minium, MD  promethazine (PHENERGAN) 25 MG tablet Take 0.5 tablets (12.5 mg total) by mouth every 6 (six) hours as needed for nausea or vomiting. 06/26/18   [name], MD  ranitidine  (ZANTAC) 150 MG capsule Take 1 capsule (150 mg total) by mouth 2 (two) times daily. Patient not taking: Reported on 12/12/2015 12/06/15   Sharman Cheek, MD  sertraline (ZOLOFT) 100 MG tablet Take 1 tablet (100 mg total) by mouth daily. Patient not taking: Reported on 12/12/2015 02/07/15   [name], MD     Allergies Penicillins   Family History  Problem Relation Age of Onset   Diabetes Maternal Grandfather    Diverticulitis Maternal Grandmother     Social History Social History   Tobacco Use   Smoking status: Every Day    Packs/day: 1.00    Years: 8.00    Pack years: 8.00    Types: Cigarettes  Substance Use Topics   Alcohol use: No   Drug use: Yes    Types: Marijuana    Review of Systems  Constitutional:   No fever or chills.  ENT:   No sore throat. No rhinorrhea. Cardiovascular:   No chest pain or syncope. Respiratory:   No dyspnea or cough. Gastrointestinal:   Negative for abdominal pain, vomiting and diarrhea.  Musculoskeletal:   Negative for focal pain or swelling All other systems reviewed and are negative except as documented above in ROS and HPI.  ____________________________________________   PHYSICAL EXAM:  VITAL SIGNS: ED Triage Vitals  Enc Vitals Group     BP 07/10/21 0840 (!) 155/105     Pulse Rate 07/10/21 0840 (!) 101     Resp 07/10/21 0840 20     Temp 07/10/21 0840 98.4 F (36.9 C)     Temp Source 07/10/21 0840 Oral     SpO2 07/10/21 0840 95 %     Weight 07/10/21 0841 240 lb (108.9 kg)     Height 07/10/21 0841 5\' 7"  (1.702 m)     Head Circumference --      Peak Flow --      Pain Score 07/10/21 0840 6     Pain Loc --      Pain Edu? --      Excl. in GC? --     Vital signs reviewed, nursing assessments reviewed.   Constitutional:   Alert and oriented. Non-toxic appearance. Eyes:   Conjunctivae are normal. EOMI. ENT      Head:   Normocephalic and atraumatic.      Mouth/Throat:   MMM      Neck:   No meningismus. Full ROM.   Thyroid nonpalpable, nontender Hematological/Lymphatic/Immunilogical:   No cervical lymphadenopathy. Cardiovascular:   RRR. Symmetric bilateral radial and DP pulses.  No murmurs. Cap refill less than 2 seconds. Respiratory:   Normal respiratory effort without tachypnea/retractions. Breath sounds are clear and equal bilaterally. No wheezes/rales/rhonchi. Gastrointestinal:   Soft and nontender.  Musculoskeletal:   Normal range of motion in all extremities.  No edema. Neurologic:   Normal speech and language.  Motor grossly intact. No acute focal neurologic deficits are appreciated.  Skin:  Skin is warm, dry and intact. No rash noted.  No wounds.  ____________________________________________    LABS (pertinent positives/negatives) (all labs ordered are listed, but only abnormal results are displayed) Labs Reviewed  CBC - Abnormal; Notable for the following components:      Result Value   Hemoglobin 15.5 (*)    MCH 34.4 (*)    All other components within normal limits  BASIC METABOLIC PANEL - Abnormal; Notable for the following components:   Glucose, Bld 106 (*)    All other components within normal limits  T4, FREE  TSH   ____________________________________________   EKG Interpreted by me  Date: 07/10/2021  Rate: 79  Rhythm: normal sinus rhythm  QRS Axis: normal  Intervals: normal  ST/T Wave abnormalities: normal  Conduction Disutrbances: none  Narrative Interpretation: unremarkable    ____________________________________________    RADIOLOGY  No results found.  ____________________________________________   PROCEDURES Procedures  ____________________________________________  CLINICAL IMPRESSION / ASSESSMENT AND PLAN / ED COURSE  Pertinent labs & imaging results that were available during my care of the patient were reviewed by me and considered in my medical decision making (see chart for details).  RONETTA MOLLA was evaluated in Emergency  Department on 07/10/2021 for the symptoms described in the history of present illness. She was evaluated in the context of the global COVID-19 pandemic, which necessitated consideration that the patient might be at risk for infection with the SARS-CoV-2 virus that causes COVID-19. Institutional protocols and algorithms that pertain to the evaluation of patients at risk for COVID-19 are in a state of rapid change based on information released by regulatory bodies including the CDC and federal and state organizations. These policies and algorithms were followed during the patient's care in the ED.   Patient presents with anxiety symptoms.  Vital signs overall unremarkable, she does appear anxious.  Lab panel including TFTs is normal.  Stable for discharge to follow-up with behavioral medicine.  Patient does not currently have a PCP but has already called to establish care and try to make appointments with new clinics.      ____________________________________________   FINAL CLINICAL IMPRESSION(S) / ED DIAGNOSES    Final diagnoses:  Anxiety     ED Discharge Orders          Ordered    hydrOXYzine (ATARAX) 25 MG tablet  3 times daily PRN        07/10/21 1231            Portions of this note were generated with dragon dictation software. Dictation errors may occur despite best attempts at proofreading.   Sharman Cheek, MD 07/10/21 1233    Sharman Cheek, MD 07/10/21 [phone]

## 2021-07-10 NOTE — ED Triage Notes (Signed)
Pt reports thinks having problems with thyroid. Had anxiety attack yesterday and reports has had increased anxiety, weight gain, brittle hair, joint pain and puffiness in neck over the past year. Has not been seen for thyroid.

## 2021-07-11 ENCOUNTER — Telehealth: Payer: Self-pay

## 2021-07-11 NOTE — Telephone Encounter (Signed)
Transition Care Management Follow-up Telephone Call Date of discharge and from where: 07/10/2021-[facility] How have you been since you were released from the hospital? Patient stated she is not feeling ok. She has followed up with [facility] today.  Any questions or concerns? No  Items Reviewed: Did the pt receive and understand the discharge instructions provided? Yes  Medications obtained and verified? Yes  Other? No  Any new allergies since your discharge? No  Dietary orders reviewed? No Do you have support at home? Yes   Home Care and Equipment/Supplies: Were home health services ordered? not applicable If so, what is the name of the agency? N/A  Has the agency set up a time to come to the patient's home? not applicable Were any new equipment or medical supplies ordered?  No What is the name of the medical supply agency? N/A Were you able to get the supplies/equipment? not applicable Do you have any questions related to the use of the equipment or supplies? No  Functional Questionnaire: (I = Independent and D = Dependent) ADLs: I  Bathing/Dressing- I  Meal Prep- I  Eating- I  Maintaining continence- I  Transferring/Ambulation- I  Managing Meds- I  Follow up appointments reviewed:  PCP Hospital f/u appt confirmed? Yes  patient stated she saw a provider at [facility] on [facility] f/u appt confirmed? No   Are transportation arrangements needed? No  If their condition worsens, is the pt aware to call PCP or go to the Emergency Dept.? Yes Was the patient provided with contact information for the PCP's office or ED? Yes Was to pt encouraged to call back with questions or concerns? Yes

## 2021-07-18 DIAGNOSIS — R200 Anesthesia of skin: Secondary | ICD-10-CM | POA: Diagnosis not present

## 2021-07-18 DIAGNOSIS — F17210 Nicotine dependence, cigarettes, uncomplicated: Secondary | ICD-10-CM | POA: Diagnosis not present

## 2021-07-18 DIAGNOSIS — M79652 Pain in left thigh: Secondary | ICD-10-CM | POA: Diagnosis not present

## 2021-07-18 DIAGNOSIS — M791 Myalgia, unspecified site: Secondary | ICD-10-CM | POA: Diagnosis not present

## 2021-07-18 DIAGNOSIS — R350 Frequency of micturition: Secondary | ICD-10-CM | POA: Diagnosis not present

## 2021-07-18 DIAGNOSIS — M79662 Pain in left lower leg: Secondary | ICD-10-CM | POA: Diagnosis not present

## 2021-07-18 DIAGNOSIS — R0789 Other chest pain: Secondary | ICD-10-CM | POA: Diagnosis not present

## 2021-07-18 DIAGNOSIS — E669 Obesity, unspecified: Secondary | ICD-10-CM | POA: Diagnosis not present

## 2021-07-18 DIAGNOSIS — M79651 Pain in right thigh: Secondary | ICD-10-CM | POA: Diagnosis not present

## 2021-07-18 DIAGNOSIS — M79661 Pain in right lower leg: Secondary | ICD-10-CM | POA: Diagnosis not present

## 2021-07-19 ENCOUNTER — Telehealth: Payer: Self-pay

## 2021-07-19 DIAGNOSIS — R590 Localized enlarged lymph nodes: Secondary | ICD-10-CM | POA: Diagnosis not present

## 2021-07-19 NOTE — Telephone Encounter (Signed)
Transition Care Management Unsuccessful Follow-up Telephone Call  Date of discharge and from where:  07/18/2021-[facility]   Attempts:  1st Attempt  Reason for unsuccessful TCM follow-up call:  Voice mail full

## 2021-07-20 NOTE — Telephone Encounter (Signed)
Transition Care Management Unsuccessful Follow-up Telephone Call  Date of discharge and from where:  07/18/2021-[facility]   Attempts:  2nd Attempt  Reason for unsuccessful TCM follow-up call:  Voice mail full

## 2021-07-21 NOTE — Telephone Encounter (Signed)
Transition Care Management Follow-up Telephone Call Date of discharge and from where: 07/18/2021-[facility]  How have you been since you were released from the hospital? Patient stated she is ok but will follow up with her new primary care outside of [facility] on 07/25/2021 Any questions or concerns? No  Items Reviewed: Did the pt receive and understand the discharge instructions provided? Yes  Medications obtained and verified? Yes  Other? No  Any new allergies since your discharge? No  Dietary orders reviewed? No Do you have support at home? Yes   Home Care and Equipment/Supplies: Were home health services ordered? not applicable If so, what is the name of the agency? N/A  Has the agency set up a time to come to the patient's home? not applicable Were any new equipment or medical supplies ordered?  No What is the name of the medical supply agency? N/A Were you able to get the supplies/equipment? not applicable Do you have any questions related to the use of the equipment or supplies? No  Functional Questionnaire: (I = Independent and D = Dependent) ADLs: I  Bathing/Dressing- I  Meal Prep- I  Eating- I  Maintaining continence- I  Transferring/Ambulation- I  Managing Meds- I  Follow up appointments reviewed:  PCP Hospital f/u appt confirmed? Yes  Scheduled to see New PCP outside of [facility] on 07/25/2021 @ n/a. Specialist Hospital f/u appt confirmed? No   Are transportation arrangements needed? No  If their condition worsens, is the pt aware to call PCP or go to the Emergency Dept.? Yes Was the patient provided with contact information for the PCP's office or ED? Yes Was to pt encouraged to call back with questions or concerns? Yes

## 2021-07-24 DIAGNOSIS — Z20822 Contact with and (suspected) exposure to covid-19: Secondary | ICD-10-CM | POA: Diagnosis not present

## 2021-07-24 DIAGNOSIS — K5900 Constipation, unspecified: Secondary | ICD-10-CM | POA: Diagnosis not present

## 2021-07-24 DIAGNOSIS — R251 Tremor, unspecified: Secondary | ICD-10-CM | POA: Diagnosis not present

## 2021-07-24 DIAGNOSIS — J40 Bronchitis, not specified as acute or chronic: Secondary | ICD-10-CM | POA: Diagnosis not present

## 2021-07-24 DIAGNOSIS — R519 Headache, unspecified: Secondary | ICD-10-CM | POA: Diagnosis not present

## 2021-07-24 DIAGNOSIS — R6884 Jaw pain: Secondary | ICD-10-CM | POA: Diagnosis not present

## 2021-07-24 DIAGNOSIS — F17210 Nicotine dependence, cigarettes, uncomplicated: Secondary | ICD-10-CM | POA: Diagnosis not present

## 2021-07-24 DIAGNOSIS — M549 Dorsalgia, unspecified: Secondary | ICD-10-CM | POA: Diagnosis not present

## 2021-07-24 DIAGNOSIS — R350 Frequency of micturition: Secondary | ICD-10-CM | POA: Diagnosis not present

## 2021-07-24 DIAGNOSIS — M542 Cervicalgia: Secondary | ICD-10-CM | POA: Diagnosis not present

## 2021-07-24 DIAGNOSIS — F419 Anxiety disorder, unspecified: Secondary | ICD-10-CM | POA: Diagnosis not present

## 2021-07-24 DIAGNOSIS — L739 Follicular disorder, unspecified: Secondary | ICD-10-CM | POA: Diagnosis not present

## 2021-07-24 DIAGNOSIS — R599 Enlarged lymph nodes, unspecified: Secondary | ICD-10-CM | POA: Diagnosis not present

## 2021-07-25 ENCOUNTER — Telehealth: Payer: Self-pay

## 2021-07-25 DIAGNOSIS — F410 Panic disorder [episodic paroxysmal anxiety] without agoraphobia: Secondary | ICD-10-CM | POA: Diagnosis not present

## 2021-07-25 DIAGNOSIS — Z6836 Body mass index (BMI) 36.0-36.9, adult: Secondary | ICD-10-CM | POA: Diagnosis not present

## 2021-07-25 DIAGNOSIS — F411 Generalized anxiety disorder: Secondary | ICD-10-CM | POA: Diagnosis not present

## 2021-07-25 DIAGNOSIS — F419 Anxiety disorder, unspecified: Secondary | ICD-10-CM | POA: Diagnosis not present

## 2021-07-25 NOTE — Telephone Encounter (Signed)
Transition Care Management Unsuccessful Follow-up Telephone Call  Date of discharge and from where:  07/24/2021 from [facility]  Attempts:  1st Attempt  Reason for unsuccessful TCM follow-up call:  Unable to leave message

## 2021-07-26 NOTE — Telephone Encounter (Signed)
Transition Care Management Follow-up Telephone Call Date of discharge and from where: 07/24/2021 from [facility] How have you been since you were released from the hospital? Pt stated that she is feeling better and did not have any questions or concerns at this time.  Any questions or concerns? No  Items Reviewed: Did the pt receive and understand the discharge instructions provided? Yes  Medications obtained and verified? Yes  Other? No  Any new allergies since your discharge? No  Dietary orders reviewed? No Do you have support at home? Yes   Functional Questionnaire: (I = Independent and D = Dependent) ADLs: I  Bathing/Dressing- I  Meal Prep- I  Eating- I  Maintaining continence- I  Transferring/Ambulation- I  Managing Meds- I   Follow up appointments reviewed:  PCP Hospital f/u appt confirmed? Yes  Pt saw PCP (not [facility])  Specialist Hospital f/u appt confirmed? No   Are transportation arrangements needed? No  If their condition worsens, is the pt aware to call PCP or go to the Emergency Dept.? Yes Was the patient provided with contact information for the PCP's office or ED? Yes Was to pt encouraged to call back with questions or concerns? Yes

## 2021-07-27 DIAGNOSIS — R5383 Other fatigue: Secondary | ICD-10-CM | POA: Diagnosis not present

## 2021-07-27 DIAGNOSIS — R000 Tachycardia, unspecified: Secondary | ICD-10-CM | POA: Diagnosis not present

## 2021-07-27 DIAGNOSIS — Z113 Encounter for screening for infections with a predominantly sexual mode of transmission: Secondary | ICD-10-CM | POA: Diagnosis not present

## 2021-07-27 DIAGNOSIS — J029 Acute pharyngitis, unspecified: Secondary | ICD-10-CM | POA: Diagnosis not present

## 2021-07-27 DIAGNOSIS — R531 Weakness: Secondary | ICD-10-CM | POA: Diagnosis not present

## 2021-07-27 DIAGNOSIS — R519 Headache, unspecified: Secondary | ICD-10-CM | POA: Diagnosis not present

## 2021-07-27 DIAGNOSIS — F419 Anxiety disorder, unspecified: Secondary | ICD-10-CM | POA: Diagnosis not present

## 2021-07-27 DIAGNOSIS — Z79899 Other long term (current) drug therapy: Secondary | ICD-10-CM | POA: Diagnosis not present

## 2021-07-27 DIAGNOSIS — R9431 Abnormal electrocardiogram [ECG] [EKG]: Secondary | ICD-10-CM | POA: Diagnosis not present

## 2021-07-27 DIAGNOSIS — R079 Chest pain, unspecified: Secondary | ICD-10-CM | POA: Diagnosis not present

## 2021-07-27 DIAGNOSIS — Z87891 Personal history of nicotine dependence: Secondary | ICD-10-CM | POA: Diagnosis not present

## 2021-07-27 DIAGNOSIS — Z5321 Procedure and treatment not carried out due to patient leaving prior to being seen by health care provider: Secondary | ICD-10-CM | POA: Diagnosis not present

## 2021-07-27 DIAGNOSIS — R4189 Other symptoms and signs involving cognitive functions and awareness: Secondary | ICD-10-CM | POA: Diagnosis not present

## 2021-07-27 DIAGNOSIS — R630 Anorexia: Secondary | ICD-10-CM | POA: Diagnosis not present

## 2021-07-27 DIAGNOSIS — R0789 Other chest pain: Secondary | ICD-10-CM | POA: Diagnosis not present

## 2021-07-27 DIAGNOSIS — F17210 Nicotine dependence, cigarettes, uncomplicated: Secondary | ICD-10-CM | POA: Diagnosis not present

## 2021-07-27 DIAGNOSIS — R002 Palpitations: Secondary | ICD-10-CM | POA: Diagnosis not present

## 2021-07-27 DIAGNOSIS — R6883 Chills (without fever): Secondary | ICD-10-CM | POA: Diagnosis not present

## 2021-07-28 DIAGNOSIS — R002 Palpitations: Secondary | ICD-10-CM | POA: Diagnosis not present

## 2021-07-28 DIAGNOSIS — I4510 Unspecified right bundle-branch block: Secondary | ICD-10-CM | POA: Diagnosis not present

## 2021-07-28 DIAGNOSIS — R9431 Abnormal electrocardiogram [ECG] [EKG]: Secondary | ICD-10-CM | POA: Diagnosis not present

## 2021-07-29 DIAGNOSIS — K047 Periapical abscess without sinus: Secondary | ICD-10-CM | POA: Diagnosis not present

## 2021-07-29 DIAGNOSIS — Z20822 Contact with and (suspected) exposure to covid-19: Secondary | ICD-10-CM | POA: Diagnosis not present

## 2021-07-29 DIAGNOSIS — M791 Myalgia, unspecified site: Secondary | ICD-10-CM | POA: Diagnosis not present

## 2021-07-29 DIAGNOSIS — F17210 Nicotine dependence, cigarettes, uncomplicated: Secondary | ICD-10-CM | POA: Diagnosis not present

## 2021-07-29 DIAGNOSIS — R221 Localized swelling, mass and lump, neck: Secondary | ICD-10-CM | POA: Diagnosis not present

## 2021-07-29 DIAGNOSIS — R6883 Chills (without fever): Secondary | ICD-10-CM | POA: Diagnosis not present

## 2021-07-29 DIAGNOSIS — R110 Nausea: Secondary | ICD-10-CM | POA: Diagnosis not present

## 2021-07-29 DIAGNOSIS — R251 Tremor, unspecified: Secondary | ICD-10-CM | POA: Diagnosis not present

## 2021-07-30 DIAGNOSIS — R251 Tremor, unspecified: Secondary | ICD-10-CM | POA: Diagnosis not present

## 2021-07-30 DIAGNOSIS — F32A Depression, unspecified: Secondary | ICD-10-CM | POA: Diagnosis not present

## 2021-07-30 DIAGNOSIS — R109 Unspecified abdominal pain: Secondary | ICD-10-CM | POA: Diagnosis not present

## 2021-07-30 DIAGNOSIS — M542 Cervicalgia: Secondary | ICD-10-CM | POA: Diagnosis not present

## 2021-07-30 DIAGNOSIS — R0789 Other chest pain: Secondary | ICD-10-CM | POA: Diagnosis not present

## 2021-07-30 DIAGNOSIS — F419 Anxiety disorder, unspecified: Secondary | ICD-10-CM | POA: Diagnosis not present

## 2021-07-30 DIAGNOSIS — R1110 Vomiting, unspecified: Secondary | ICD-10-CM | POA: Diagnosis not present

## 2021-07-30 DIAGNOSIS — D7589 Other specified diseases of blood and blood-forming organs: Secondary | ICD-10-CM | POA: Diagnosis not present

## 2021-07-30 DIAGNOSIS — F1290 Cannabis use, unspecified, uncomplicated: Secondary | ICD-10-CM | POA: Diagnosis not present

## 2021-07-30 DIAGNOSIS — J029 Acute pharyngitis, unspecified: Secondary | ICD-10-CM | POA: Diagnosis not present

## 2021-07-30 DIAGNOSIS — R1011 Right upper quadrant pain: Secondary | ICD-10-CM | POA: Diagnosis not present

## 2021-07-30 DIAGNOSIS — F17210 Nicotine dependence, cigarettes, uncomplicated: Secondary | ICD-10-CM | POA: Diagnosis not present

## 2021-07-30 DIAGNOSIS — R079 Chest pain, unspecified: Secondary | ICD-10-CM | POA: Diagnosis not present

## 2021-07-30 DIAGNOSIS — R6883 Chills (without fever): Secondary | ICD-10-CM | POA: Diagnosis not present

## 2021-07-30 DIAGNOSIS — R519 Headache, unspecified: Secondary | ICD-10-CM | POA: Diagnosis not present

## 2021-07-30 DIAGNOSIS — Z20822 Contact with and (suspected) exposure to covid-19: Secondary | ICD-10-CM | POA: Diagnosis not present

## 2021-07-30 DIAGNOSIS — R300 Dysuria: Secondary | ICD-10-CM | POA: Diagnosis not present

## 2021-07-30 DIAGNOSIS — R110 Nausea: Secondary | ICD-10-CM | POA: Diagnosis not present

## 2021-07-30 DIAGNOSIS — Z79899 Other long term (current) drug therapy: Secondary | ICD-10-CM | POA: Diagnosis not present

## 2021-07-30 DIAGNOSIS — K047 Periapical abscess without sinus: Secondary | ICD-10-CM | POA: Diagnosis not present

## 2021-07-31 DIAGNOSIS — R1110 Vomiting, unspecified: Secondary | ICD-10-CM | POA: Diagnosis not present

## 2021-07-31 DIAGNOSIS — K047 Periapical abscess without sinus: Secondary | ICD-10-CM | POA: Diagnosis not present

## 2021-07-31 DIAGNOSIS — R0789 Other chest pain: Secondary | ICD-10-CM | POA: Diagnosis not present

## 2021-07-31 DIAGNOSIS — M542 Cervicalgia: Secondary | ICD-10-CM | POA: Diagnosis not present

## 2021-08-01 ENCOUNTER — Telehealth: Payer: Self-pay

## 2021-08-01 NOTE — Telephone Encounter (Signed)
Transition Care Management Follow-up Telephone Call Date of discharge and from where: 07/31/2021-[facility]  How have you been since you were released from the hospital? Patient state she is doing fine. She is no longer a patient at [facility]. She now sees [facility] in [place].  Any questions or concerns? No

## 2021-08-03 DIAGNOSIS — F411 Generalized anxiety disorder: Secondary | ICD-10-CM | POA: Diagnosis not present

## 2021-08-03 DIAGNOSIS — Z6836 Body mass index (BMI) 36.0-36.9, adult: Secondary | ICD-10-CM | POA: Diagnosis not present

## 2021-08-03 DIAGNOSIS — M791 Myalgia, unspecified site: Secondary | ICD-10-CM | POA: Diagnosis not present

## 2021-08-03 DIAGNOSIS — F410 Panic disorder [episodic paroxysmal anxiety] without agoraphobia: Secondary | ICD-10-CM | POA: Diagnosis not present

## 2021-08-03 DIAGNOSIS — J310 Chronic rhinitis: Secondary | ICD-10-CM | POA: Diagnosis not present

## 2021-08-09 DIAGNOSIS — Z20822 Contact with and (suspected) exposure to covid-19: Secondary | ICD-10-CM | POA: Diagnosis not present

## 2021-08-09 DIAGNOSIS — F410 Panic disorder [episodic paroxysmal anxiety] without agoraphobia: Secondary | ICD-10-CM | POA: Diagnosis not present

## 2021-08-09 DIAGNOSIS — R6889 Other general symptoms and signs: Secondary | ICD-10-CM | POA: Diagnosis not present

## 2021-08-09 DIAGNOSIS — F17210 Nicotine dependence, cigarettes, uncomplicated: Secondary | ICD-10-CM | POA: Diagnosis not present

## 2021-08-09 DIAGNOSIS — F411 Generalized anxiety disorder: Secondary | ICD-10-CM | POA: Diagnosis not present

## 2021-08-09 DIAGNOSIS — Z6836 Body mass index (BMI) 36.0-36.9, adult: Secondary | ICD-10-CM | POA: Diagnosis not present

## 2021-08-11 DIAGNOSIS — F410 Panic disorder [episodic paroxysmal anxiety] without agoraphobia: Secondary | ICD-10-CM | POA: Diagnosis not present

## 2021-08-11 DIAGNOSIS — F411 Generalized anxiety disorder: Secondary | ICD-10-CM | POA: Diagnosis not present

## 2021-08-13 DIAGNOSIS — R0600 Dyspnea, unspecified: Secondary | ICD-10-CM | POA: Diagnosis not present

## 2021-08-13 DIAGNOSIS — U071 COVID-19: Secondary | ICD-10-CM | POA: Diagnosis not present

## 2021-08-13 DIAGNOSIS — Z86010 Personal history of colonic polyps: Secondary | ICD-10-CM | POA: Diagnosis not present

## 2021-08-13 DIAGNOSIS — F17210 Nicotine dependence, cigarettes, uncomplicated: Secondary | ICD-10-CM | POA: Diagnosis not present

## 2021-08-13 DIAGNOSIS — Z975 Presence of (intrauterine) contraceptive device: Secondary | ICD-10-CM | POA: Diagnosis not present

## 2021-08-18 DIAGNOSIS — R531 Weakness: Secondary | ICD-10-CM | POA: Diagnosis not present

## 2021-08-19 ENCOUNTER — Other Ambulatory Visit: Payer: Self-pay

## 2021-08-19 ENCOUNTER — Emergency Department
Admission: EM | Admit: 2021-08-19 | Discharge: 2021-08-19 | Disposition: A | Discharge status: Home / Self Care | Payer: Medicaid Other | Attending: Emergency Medicine | Admitting: Emergency Medicine

## 2021-08-19 DIAGNOSIS — Z8616 Personal history of COVID-19: Secondary | ICD-10-CM | POA: Diagnosis not present

## 2021-08-19 DIAGNOSIS — R002 Palpitations: Secondary | ICD-10-CM | POA: Insufficient documentation

## 2021-08-19 DIAGNOSIS — R42 Dizziness and giddiness: Secondary | ICD-10-CM | POA: Insufficient documentation

## 2021-08-19 NOTE — Discharge Instructions (Signed)
As we discussed, please reach out to the cardiologist to see them in the clinic to discuss a Holter monitor for your palpitations.  If you develop any passing out with chest pain or worsening features, please return to the ED.

## 2021-08-19 NOTE — ED Provider Notes (Signed)
[facility] Provider Note    Event Date/Time   First MD Initiated Contact with Patient 08/19/21 1242     (approximate)   History   Dizziness   HPI  Kendra [name] is a 31 y.o. female who presents to the ED for evaluation of Dizziness   I reviewed recurrent ED visits, mostly in the [facility] healthcare system, the past few weeks.  6 visits this month, and two separate visits just yesterday.  She was recently diagnosed with COVID-19 on 1/16 and has recurrent visits for palpitations and symptoms associated with COVID.  She had a negative D-dimer yesterday.  Extensive work-up yesterday with blood work, CXR, troponins all were unremarkable and she was discharged with symptomatic measures.  Patient presents to the ED for evaluation of 4 weeks of "feeling like my life force is being drained out of me."  She reports 4 weeks of intermittent palpitations and feeling awful.  She reports anxiety, tearfulness and exasperation of her situation and uncertain what to do due to her continued symptoms.  Denies any chest pain, syncopal episodes.  Denies any acute events since she was seen yesterday at [facility].  Physical Exam   Triage Vital Signs: ED Triage Vitals [08/19/21 1242]  Enc Vitals Group     BP      Pulse      Resp      Temp      Temp src      SpO2      Weight      Height      Head Circumference      Peak Flow      Pain Score 8     Pain Loc      Pain Edu?      Excl. in [place]?     Most recent vital signs: Vitals:   08/19/21 1242 08/19/21 1256  BP: 140/83   Pulse: (!) 130 100  Resp: 19 13  Temp: 98.4 F (36.9 C)   SpO2: 97% 95%    General: Awake, no distress.  Obese.  Ambulatory with normal gait.  Tearful when discussing her symptoms.   CV:  Good peripheral perfusion.  Resp:  Normal effort. CTAB Abd:  No distention. Soft and nontender.  MSK:  No deformity noted.  Neuro:  No focal deficits appreciated. Cranial nerves II through XII  intact 5/5 strength and sensation in all 4 extremities Other:     ED Results / Procedures / Treatments   Labs (all labs ordered are listed, but only abnormal results are displayed) Labs Reviewed - No data to display  EKG  Sinus rhythm, rate of 109 bpm.  Normal axis and intervals.  T wave inversion isolated to lead III without ischemic features otherwise.  Similar morphology to EKG from 12/13 without acute changes  RADIOLOGY   Official radiology report(s): No results found.  PROCEDURES and INTERVENTIONS:  .1-3 Lead EKG Interpretation Performed by: Vladimir Crofts, MD Authorized by: Vladimir Crofts, MD     Interpretation: normal     ECG rate:  80   ECG rate assessment: normal     Rhythm: sinus rhythm     Ectopy: none     Conduction: normal   Comments:     Rate increases to the low 100s when she becomes upset, remains sinus.  Medications - No data to display   IMPRESSION / MDM / [place] / ED COURSE  I reviewed the triage vital signs and  the nursing notes.  32 year old female presents to the ED with chronic palpitations without acute features and suitable for outpatient management with cardiology follow-up.  She has had a rather extensive work-up as recently as yesterday without acute symptoms or features to suggest normal pathology to require additional work-up.  EKG is nonischemic and intervals are reassuring.  She looks clinically well to me with reassuring neurologic and vascular examination.  I see no indications for further diagnostics.  We discussed following up with cardiology regarding her palpitations and the possible utility of a Holter monitor.  We discussed return precautions for the ED.      FINAL CLINICAL IMPRESSION(S) / ED DIAGNOSES   Final diagnoses:  Palpitations     Rx / DC Orders   ED Discharge Orders     None        Note:  This document was prepared using Dragon voice recognition software and may include unintentional dictation  errors.   Vladimir Crofts, MD 08/19/21 1315

## 2021-08-19 NOTE — ED Notes (Signed)
Patient Alert and oriented to baseline. Stable and ambulatory to baseline. Patient verbalized understanding of the discharge instructions.  Patient belongings were taken by the patient.   

## 2021-08-19 NOTE — ED Triage Notes (Addendum)
Pt comes with c/o dizziness, SOB and CP that all has been ongoing. Pt states dx with covid on the 16th. Pt states it is more than that. Pt states her head is killing her and her heart is staying elevated.  Pt states she feels her head is going to explode. Pt states I can't even function at home.

## 2021-08-20 ENCOUNTER — Telehealth: Payer: Self-pay

## 2021-08-20 DIAGNOSIS — Z6836 Body mass index (BMI) 36.0-36.9, adult: Secondary | ICD-10-CM | POA: Diagnosis not present

## 2021-08-20 DIAGNOSIS — R8271 Bacteriuria: Secondary | ICD-10-CM | POA: Diagnosis not present

## 2021-08-20 DIAGNOSIS — F411 Generalized anxiety disorder: Secondary | ICD-10-CM | POA: Diagnosis not present

## 2021-08-20 DIAGNOSIS — F410 Panic disorder [episodic paroxysmal anxiety] without agoraphobia: Secondary | ICD-10-CM | POA: Diagnosis not present

## 2021-08-20 DIAGNOSIS — R4189 Other symptoms and signs involving cognitive functions and awareness: Secondary | ICD-10-CM | POA: Diagnosis not present

## 2021-08-20 DIAGNOSIS — I479 Paroxysmal tachycardia, unspecified: Secondary | ICD-10-CM | POA: Diagnosis not present

## 2021-08-20 NOTE — Telephone Encounter (Signed)
Transition Care Management Follow-up Telephone Call Date of discharge and from where: 08/19/2021-[facility] How have you been since you were released from the hospital? Patient stated she saw primary ca re today.  Any questions or concerns? No

## 2021-08-30 ENCOUNTER — Telehealth: Payer: Self-pay

## 2021-08-30 NOTE — Telephone Encounter (Signed)
Transition Care Management Follow-up Telephone Call Date of discharge and from where: 08/30/2021-[facility]  How have you been since you were released from the hospital? Patient is doing fine Primary care is [facility]  Any questions or concerns? No

## 2021-09-04 ENCOUNTER — Other Ambulatory Visit (HOSPITAL_COMMUNITY): Payer: Self-pay | Admitting: Physician Assistant

## 2021-09-04 ENCOUNTER — Emergency Department
Admission: EM | Admit: 2021-09-04 | Discharge: 2021-09-04 | Disposition: A | Discharge status: Home / Self Care | Payer: Medicaid Other | Attending: Emergency Medicine | Admitting: Emergency Medicine

## 2021-09-04 ENCOUNTER — Emergency Department: Payer: Medicaid Other

## 2021-09-04 ENCOUNTER — Other Ambulatory Visit: Payer: Self-pay | Admitting: Physician Assistant

## 2021-09-04 DIAGNOSIS — R5383 Other fatigue: Secondary | ICD-10-CM | POA: Diagnosis not present

## 2021-09-04 DIAGNOSIS — R55 Syncope and collapse: Secondary | ICD-10-CM | POA: Insufficient documentation

## 2021-09-04 DIAGNOSIS — G35 Multiple sclerosis: Secondary | ICD-10-CM

## 2021-09-04 DIAGNOSIS — R079 Chest pain, unspecified: Secondary | ICD-10-CM | POA: Diagnosis not present

## 2021-09-04 DIAGNOSIS — R42 Dizziness and giddiness: Secondary | ICD-10-CM | POA: Insufficient documentation

## 2021-09-04 DIAGNOSIS — R531 Weakness: Secondary | ICD-10-CM | POA: Diagnosis present

## 2021-09-04 LAB — URINALYSIS, COMPLETE (UACMP) WITH MICROSCOPIC
Bilirubin Urine: NEGATIVE
Glucose, UA: NEGATIVE mg/dL
Ketones, ur: NEGATIVE mg/dL
Leukocytes,Ua: NEGATIVE
Nitrite: NEGATIVE
Protein, ur: NEGATIVE mg/dL
Specific Gravity, Urine: 1.024 (ref 1.005–1.030)
pH: 5 (ref 5.0–8.0)

## 2021-09-04 LAB — COMPREHENSIVE METABOLIC PANEL
ALT: 14 U/L (ref 0–44)
AST: 14 U/L — ABNORMAL LOW (ref 15–41)
Albumin: 4.3 g/dL (ref 3.5–5.0)
Alkaline Phosphatase: 54 U/L (ref 38–126)
Anion gap: 8 (ref 5–15)
BUN: 10 mg/dL (ref 6–20)
CO2: 27 mmol/L (ref 22–32)
Calcium: 9.6 mg/dL (ref 8.9–10.3)
Chloride: 102 mmol/L (ref 98–111)
Creatinine, Ser: 0.96 mg/dL (ref 0.44–1.00)
GFR, Estimated: >60 mL/min (ref >60)
Glucose, Bld: 104 mg/dL — ABNORMAL HIGH (ref 70–99)
Potassium: 3.7 mmol/L (ref 3.5–5.1)
Sodium: 137 mmol/L (ref 135–145)
Total Bilirubin: 0.9 mg/dL (ref 0.3–1.2)
Total Protein: 8 g/dL (ref 6.5–8.1)

## 2021-09-04 LAB — CBC WITH DIFFERENTIAL/PLATELET
Abs Immature Granulocytes: 0.03 10*3/uL (ref 0.00–0.07)
Basophils Absolute: 0 10*3/uL (ref 0.0–0.1)
Basophils Relative: 0 %
Eosinophils Absolute: 0.2 10*3/uL (ref 0.0–0.5)
Eosinophils Relative: 2 %
HCT: 42.3 % (ref 36.0–46.0)
Hemoglobin: 14.3 g/dL (ref 12.0–15.0)
Immature Granulocytes: 0 %
Lymphocytes Relative: 23 %
Lymphs Abs: 2.1 10*3/uL (ref 0.7–4.0)
MCH: 34.1 pg — ABNORMAL HIGH (ref 26.0–34.0)
MCHC: 33.8 g/dL (ref 30.0–36.0)
MCV: 101 fL — ABNORMAL HIGH (ref 80.0–100.0)
Monocytes Absolute: 0.7 10*3/uL (ref 0.1–1.0)
Monocytes Relative: 8 %
Neutro Abs: 6.1 10*3/uL (ref 1.7–7.7)
Neutrophils Relative %: 67 %
Platelets: 315 10*3/uL (ref 150–400)
RBC: 4.19 MIL/uL (ref 3.87–5.11)
RDW: 11.9 % (ref 11.5–15.5)
WBC: 9.1 10*3/uL (ref 4.0–10.5)
nRBC: 0 % (ref 0.0–0.2)

## 2021-09-04 LAB — LIPASE, BLOOD: Lipase: 31 U/L (ref 11–51)

## 2021-09-04 LAB — POC URINE PREG, ED: Preg Test, Ur: NEGATIVE

## 2021-09-04 LAB — TROPONIN I (HIGH SENSITIVITY): Troponin I (High Sensitivity): <2 ng/L (ref <18)

## 2021-09-04 LAB — PREGNANCY, URINE: Preg Test, Ur: NEGATIVE

## 2021-09-04 NOTE — ED Provider Notes (Signed)
[facility] Provider Note    Event Date/Time   First MD Initiated Contact with Patient 09/04/21 [phone]     (approximate)   History   Dizziness (Started propanolol, pt states dizziness, syncopal a few days prior , wants to be checked )   HPI  Kendra [name] is a 31 y.o. female  \with past medical history of ADHD, cannabis use and prior opiate use disorder on Suboxone maintenance who presents with dizziness and fatigue.  Patient tells me that she started taking propranolol 2 days ago which she has been prescribed for heart palpitations and anxiety.  This morning when she woke up she felt unwell feeling fatigued lightheaded like she was going to fall when she stood up.  Patient is also had chest pain, described as pressure-like sensation coming and going not exertional or pleuritic as well as shortness of breath.  Patient has been having a host of symptoms over the last several months which she has been to the emergency department several times for and is currently seeing cardiology for.  These include intermittent lightheadedness chest pain palpitations shortness of breath.  In the last month has had negative D-dimer negative troponins and normal EKGs.  Review of most recent cardiology note, patient was prescribed a Holter monitor for 7 days, unclear if this is been completed, an echocardiogram.  Thought that her palpitations are primarily related to anxiety.  Patient also seeing Dr. Malvin Johns with neurology for headache and neck pain with syncopal episodes well was tinnitus visual changes generalized weakness palpitations and paresthesias.  Per Dr. Malvin Johns the syncopal episodes were more consistent with vasovagal and he has ordered an MRI of the brain and cervical spine with and without contrast.  At that time it was noted that patient had been seen in the ED 9 times in the last month.  Since that time I see another 3 ED visits for anxiety, chest pain and vasovagal  syncope.     Past Medical History:  Diagnosis Date   Anxiety    Depression    Headache    stress   History of cannabis abuse    Seizures (HCC)    age 73. syncopal episode. Hit head. had seizure.    Tobacco abuse     Patient Active Problem List   Diagnosis Date Noted   Noninfectious diarrhea    Non-intractable cyclical vomiting with nausea    Diarrhea    Anxiety and depression 01/19/2015   History of cannabis dependence/abuse (HCC) 01/19/2015   Tobacco abuse 01/19/2015     Physical Exam  Triage Vital Signs: ED Triage Vitals [09/04/21 0854]  Enc Vitals Group     BP 134/87     Pulse Rate 89     Resp 17     Temp 98.5 F (36.9 C)     Temp Source Oral     SpO2 99 %     Weight 231 lb 7.7 oz (105 kg)     Height 5\' 6"  (1.676 m)     Head Circumference      Peak Flow      Pain Score 0     Pain Loc      Pain Edu?      Excl. in GC?     Most recent vital signs: Vitals:   09/04/21 0854  BP: 134/87  Pulse: 89  Resp: 17  Temp: 98.5 F (36.9 C)  SpO2: 99%     General: Awake, patient is anxious  appearing CV:  Good peripheral perfusion.  No lower extremity edema Resp:  Normal effort.  Abd:  No distention.  Soft, minimal tenderness in the left lower quadrant Neuro:             Awake, Alert, Oriented x 3  Other:     ED Results / Procedures / Treatments  Labs (all labs ordered are listed, but only abnormal results are displayed) Labs Reviewed  COMPREHENSIVE METABOLIC PANEL - Abnormal; Notable for the following components:      Result Value   Glucose, Bld 104 (*)    AST 14 (*)    All other components within normal limits  CBC WITH DIFFERENTIAL/PLATELET - Abnormal; Notable for the following components:   MCV 101.0 (*)    MCH 34.1 (*)    All other components within normal limits  URINALYSIS, COMPLETE (UACMP) WITH MICROSCOPIC - Abnormal; Notable for the following components:   Color, Urine YELLOW (*)    APPearance CLOUDY (*)    Hgb urine dipstick LARGE (*)     Bacteria, UA FEW (*)    All other components within normal limits  LIPASE, BLOOD  PREGNANCY, URINE  POC URINE PREG, ED  TROPONIN I (HIGH SENSITIVITY)  TROPONIN I (HIGH SENSITIVITY)     EKG  EKG interpreted myself, normal axis normal intervals, nonspecific T wave flattening in the anterior precordial leads, no acute ischemic changes   RADIOLOGY I reviewed the CXR which does not show any acute cardiopulmonary process; agree with radiology report     PROCEDURES:  Critical Care performed: No  .1-3 Lead EKG Interpretation Performed by: [name], MD Authorized by: [name], MD     Interpretation: normal     ECG rate assessment: normal     Rhythm: sinus rhythm     Ectopy: none     Conduction: normal    The patient is on the cardiac monitor to evaluate for evidence of arrhythmia and/or significant heart rate changes.   MEDICATIONS ORDERED IN ED: Medications - No data to display   IMPRESSION / MDM / ASSESSMENT AND PLAN / ED COURSE  I reviewed the triage vital signs and the nursing notes.                              Differential diagnosis includes, but is not limited to, medication side effect, anemia, dehydration, electrolyte abnormality, vasovagal, anxiety, pulmonary embolism  It is a 31 year old female who presents with multiple complaints today including chest pain lightheadedness generalized fatigue abdominal pain.  Per records patient is currently seeing cardiology and neurology for palpitations and syncope and headache.  She has been seen multiple times in the ED over the last month and a half.  Has had negative D-dimer negative troponin negative chest x-rays and has worn a Holter monitor scheduled to have an echo and MRI of her brain and C-spine.  She attributes her symptoms today to recently starting back on propranolol which was prescribed for anxiety and palpitations.  Vital signs are within normal limits.  Her EKG shows normal sinus rhythm  without acute ischemic changes.  Patient appears anxious but overall well.  Abdomen is soft minimally tender in the left lower quadrant not concerned for acute abdomen.  Patient's chest pain is intermittent nonexertional nonpleuritic is associate with shortness of breath.  Considered pulmonary embolism but given her prior negative D-dimer and the rest of her constellation of symptoms I think  this is less likely.  We will obtain a troponin and chest x-ray.  We will also check CBC and CMP but low suspicion for any abnormality given she has had these checked recently as well.  I reviewed her records and she has also had thyroid testing done recently which is normal.  Ultimately I suspect that her symptoms may be related to anxiety versus potential medication side effect.   Patient's laboratory work-up is reassuring.  Troponin is negative CBC and CMP within normal limits.  UA is not consistent with infection hCG is negative.  Ultimately I think the patient is stable for outpatient management ongoing follow-up with cardiology and neurology.     FINAL CLINICAL IMPRESSION(S) / ED DIAGNOSES   Final diagnoses:  Weakness     Rx / DC Orders   ED Discharge Orders     None        Note:  This document was prepared using Dragon voice recognition software and may include unintentional dictation errors.   [name], MD 09/04/21 1039

## 2021-09-04 NOTE — ED Triage Notes (Signed)
Started propanolol, pt states dizziness, syncopal a few days prior , wants to be checked

## 2021-09-04 NOTE — Discharge Instructions (Signed)
Your blood work was all reassuring.  You can hold the propranolol until you follow-up with your cardiologist.

## 2021-09-05 ENCOUNTER — Telehealth: Payer: Self-pay | Admitting: *Deleted

## 2021-09-05 DIAGNOSIS — Z599 Problem related to housing and economic circumstances, unspecified: Secondary | ICD-10-CM

## 2021-09-05 NOTE — Telephone Encounter (Signed)
Transition Care Management Unsuccessful Follow-up Telephone Call  Date of discharge and from where:  09/04/2021 [facility] ED  Attempts:  1st Attempt  Reason for unsuccessful TCM follow-up call:  Voice mail full

## 2021-09-06 ENCOUNTER — Emergency Department: Payer: Medicaid Other

## 2021-09-06 ENCOUNTER — Emergency Department
Admission: EM | Admit: 2021-09-06 | Discharge: 2021-09-06 | Disposition: A | Discharge status: Home / Self Care | Payer: Medicaid Other | Attending: Emergency Medicine | Admitting: Emergency Medicine

## 2021-09-06 ENCOUNTER — Other Ambulatory Visit: Payer: Self-pay

## 2021-09-06 ENCOUNTER — Encounter: Payer: Self-pay | Admitting: Emergency Medicine

## 2021-09-06 DIAGNOSIS — R002 Palpitations: Secondary | ICD-10-CM | POA: Insufficient documentation

## 2021-09-06 DIAGNOSIS — R079 Chest pain, unspecified: Secondary | ICD-10-CM

## 2021-09-06 DIAGNOSIS — R0602 Shortness of breath: Secondary | ICD-10-CM | POA: Diagnosis not present

## 2021-09-06 DIAGNOSIS — R0789 Other chest pain: Secondary | ICD-10-CM | POA: Diagnosis not present

## 2021-09-06 LAB — BASIC METABOLIC PANEL
Anion gap: 8 (ref 5–15)
BUN: 9 mg/dL (ref 6–20)
CO2: 27 mmol/L (ref 22–32)
Calcium: 9.3 mg/dL (ref 8.9–10.3)
Chloride: 104 mmol/L (ref 98–111)
Creatinine, Ser: 0.78 mg/dL (ref 0.44–1.00)
GFR, Estimated: >60 mL/min (ref >60)
Glucose, Bld: 101 mg/dL — ABNORMAL HIGH (ref 70–99)
Potassium: 3.9 mmol/L (ref 3.5–5.1)
Sodium: 139 mmol/L (ref 135–145)

## 2021-09-06 LAB — CBC
HCT: 42.2 % (ref 36.0–46.0)
Hemoglobin: 14 g/dL (ref 12.0–15.0)
MCH: 33.3 pg (ref 26.0–34.0)
MCHC: 33.2 g/dL (ref 30.0–36.0)
MCV: 100.2 fL — ABNORMAL HIGH (ref 80.0–100.0)
Platelets: 304 10*3/uL (ref 150–400)
RBC: 4.21 MIL/uL (ref 3.87–5.11)
RDW: 11.7 % (ref 11.5–15.5)
WBC: 5.3 10*3/uL (ref 4.0–10.5)
nRBC: 0 % (ref 0.0–0.2)

## 2021-09-06 LAB — TROPONIN I (HIGH SENSITIVITY)
Troponin I (High Sensitivity): 2 ng/L (ref <18)
Troponin I (High Sensitivity): <2 ng/L (ref <18)

## 2021-09-06 LAB — POC URINE PREG, ED: Preg Test, Ur: NEGATIVE

## 2021-09-06 MED ORDER — IOHEXOL 350 MG/ML SOLN
75.0000 mL | Freq: Once | INTRAVENOUS | Status: AC | PRN
  Administered 2021-09-06: 75 mL via INTRAVENOUS

## 2021-09-06 MED ORDER — KETOROLAC TROMETHAMINE 30 MG/ML IJ SOLN
15.0000 mg | Freq: Once | INTRAMUSCULAR | Status: AC
  Administered 2021-09-06: 15 mg via INTRAVENOUS
  Filled 2021-09-06: qty 1

## 2021-09-06 NOTE — ED Triage Notes (Signed)
Pt comes into the ED via POV c/o chest and back pain that radiates up into her neck.  PT states this has been and ongoing issue for 7 weeks, and no one has been able to figure out what is going on.  PT denies any cardiac history. Pt admits to nausea, SHOB, and dizziness.  PT currently has even and unlabored respirations.

## 2021-09-06 NOTE — ED Provider Notes (Signed)
[facility] Provider Note    Event Date/Time   First MD Initiated Contact with Patient 09/06/21 [phone]     (approximate)   History   Chief Complaint Chest Pain   HPI  Kendra [name] is a 31 y.o. female with past medical history of anxiety, depression, and cyclic vomiting syndrome who presents to the ED complaining of chest pain.  Patient ports that she has had about 7 weeks of intermittent episodes of pain starting in her chest.  She describes it as a sharp stabbing discomfort that moves into her back and up towards her neck.  It can come on at any time, is not exacerbated by anything in particular.  Episodes last between 30 minutes to 3 hours and she has not been able to find anything to alleviate the symptoms.  She will feel short of breath with palpitations when these episodes come on but she denies any recent fevers or cough.  She has not noticed any pain or swelling in her legs and denies any history of DVT/PE.  She has been seen in the ED on multiple occasions for similar symptoms with unremarkable work-ups, has also been following with cardiology and neurology.  Chart review shows a normal echocardiogram, patient reports she is waiting to hear back on the results of her Holter monitor.  She is scheduled for MRI of her brain and cervical spine through neurology next week.     Physical Exam   Triage Vital Signs: ED Triage Vitals  Enc Vitals Group     BP 09/06/21 0948 (!) 151/102     Pulse Rate 09/06/21 0948 96     Resp 09/06/21 0948 18     Temp 09/06/21 0948 99.1 F (37.3 C)     Temp Source 09/06/21 0948 Oral     SpO2 09/06/21 0948 99 %     Weight 09/06/21 0949 231 lb 7.7 oz (105 kg)     Height 09/06/21 0949 5\' 6"  (1.676 m)     Head Circumference --      Peak Flow --      Pain Score 09/06/21 0949 8     Pain Loc --      Pain Edu? --      Excl. in GC? --     Most recent vital signs: Vitals:   09/06/21 1100 09/06/21 1200  BP: (!) 142/89  129/76  Pulse: 95 81  Resp: 18 13  Temp:    SpO2: 100% 100%    Constitutional: Alert and oriented. Eyes: Conjunctivae are normal. Head: Atraumatic. Nose: No congestion/rhinnorhea. Mouth/Throat: Mucous membranes are moist.  Neck: No midline cervical spine tenderness to palpation. Cardiovascular: Normal rate, regular rhythm. Grossly normal heart sounds.  2+ radial pulses bilaterally. Respiratory: Normal respiratory effort.  No retractions. Lungs CTAB.  Anterior chest wall tenderness to palpation noted. Gastrointestinal: Soft and nontender. No distention. Musculoskeletal: No lower extremity tenderness nor edema.  Tenderness to palpation noted in mid upper back between thoracic spine and both scapula. Neurologic:  Normal speech and language. No gross focal neurologic deficits are appreciated.    ED Results / Procedures / Treatments   Labs (all labs ordered are listed, but only abnormal results are displayed) Labs Reviewed  BASIC METABOLIC PANEL - Abnormal; Notable for the following components:      Result Value   Glucose, Bld 101 (*)    All other components within normal limits  CBC - Abnormal; Notable for the following components:  MCV 100.2 (*)    All other components within normal limits  POC URINE PREG, ED  TROPONIN I (HIGH SENSITIVITY)  TROPONIN I (HIGH SENSITIVITY)     EKG  ED ECG REPORT I, Chesley Noon, the attending physician, personally viewed and interpreted this ECG.   Date: 09/06/2021  EKG Time: 9:44  Rate: 100  Rhythm: sinus tachycardia  Axis: Normal  Intervals:none  ST&T Change: None  RADIOLOGY CTA of chest reviewed by me with no obvious pulmonary embolism.  PROCEDURES:  Critical Care performed: No  .1-3 Lead EKG Interpretation Performed by: Chesley Noon, MD Authorized by: Chesley Noon, MD     Interpretation: normal     ECG rate:  65-80   ECG rate assessment: normal     Rhythm: sinus rhythm     Ectopy: none     Conduction: normal      MEDICATIONS ORDERED IN ED: Medications  ketorolac (TORADOL) 30 MG/ML injection 15 mg (15 mg Intravenous Given 09/06/21 1047)  iohexol (OMNIPAQUE) 350 MG/ML injection 75 mL (75 mLs Intravenous Contrast Given 09/06/21 1131)     IMPRESSION / MDM / ASSESSMENT AND PLAN / ED COURSE  I reviewed the triage vital signs and the nursing notes.                              31 y.o. female with past medical history of anxiety, depression, and cyclic vomiting syndrome who presents to the ED complaining of intermittent episodes of pain in her chest, palpitations, and difficulty breathing for the past 7 weeks with pain radiating towards the middle of her upper back as well as into her neck.  Differential diagnosis includes, but is not limited to, ACS, PE, pneumonia, pneumothorax, arrhythmia, anxiety, GERD, musculoskeletal pain.  Patient is nontoxic-appearing and in no acute distress, vital signs remarkable for mild tachycardia but otherwise reassuring.  EKG shows no evidence of arrhythmia or ischemia and initial troponin is negative.  Additional labs are unremarkable with CBC showing no anemia and BMP with no electrolyte abnormality.  We will observe on cardiac monitor and further assess for PE with CTA of chest given she has not yet had this performed and has been tachycardic here.  The patient is on the cardiac monitor to evaluate for evidence of arrhythmia and/or significant heart rate changes.  CTA of chest is negative for PE or other acute process, 2 sets of troponin are negative and CBC shows no anemia, BMP without electrolyte abnormality.  Patient is asymptomatic at the time of reassessment and is appropriate for discharge home with outpatient cardiology and neurology follow-up.  She was counseled to return to the ED for new worsening symptoms, patient agrees with plan.    FINAL CLINICAL IMPRESSION(S) / ED DIAGNOSES   Final diagnoses:  Nonspecific chest pain  Palpitations     Rx / DC Orders    ED Discharge Orders     None        Note:  This document was prepared using Dragon voice recognition software and may include unintentional dictation errors.   Chesley Noon, MD 09/06/21 [phone]

## 2021-09-06 NOTE — Telephone Encounter (Signed)
Transition Care Management Follow-up Telephone Call Date of discharge and from where: 09/04/2021 from [facility] How have you been since you were released from the hospital? Patient stated that she is feeling worse today. Patient stated that she is pain and is having trouble breathing. Patient stated that her mother is on the way. I kept patient on the phone until her mother arrived to take her back to the ED due to the pain level she is experiencing. Any questions or concerns? No  Items Reviewed: Did the pt receive and understand the discharge instructions provided? Yes  Medications obtained and verified? Yes  Other? No  Any new allergies since your discharge? No  Dietary orders reviewed? No Do you have support at home? Yes   Functional Questionnaire: (I = Independent and D = Dependent) ADLs: I  Bathing/Dressing- I  Meal Prep- I  Eating- I  Maintaining continence- I  Transferring/Ambulation- I  Managing Meds- I   Follow up appointments reviewed:  PCP Hospital f/u appt confirmed? No  PCP updated.  [facility] f/u appt confirmed? No   Are transportation arrangements needed? No  If their condition worsens, is the pt aware to call PCP or go to the Emergency Dept.? Yes Was the patient provided with contact information for the PCP's office or ED? Yes Was to pt encouraged to call back with questions or concerns? Yes

## 2021-09-08 ENCOUNTER — Other Ambulatory Visit: Payer: Self-pay

## 2021-09-08 ENCOUNTER — Emergency Department
Admission: EM | Admit: 2021-09-08 | Discharge: 2021-09-08 | Disposition: A | Discharge status: Home / Self Care | Payer: Medicaid Other | Attending: Emergency Medicine | Admitting: Emergency Medicine

## 2021-09-08 ENCOUNTER — Emergency Department: Payer: Medicaid Other

## 2021-09-08 DIAGNOSIS — K5900 Constipation, unspecified: Secondary | ICD-10-CM | POA: Diagnosis not present

## 2021-09-08 DIAGNOSIS — R109 Unspecified abdominal pain: Secondary | ICD-10-CM

## 2021-09-08 DIAGNOSIS — R110 Nausea: Secondary | ICD-10-CM | POA: Insufficient documentation

## 2021-09-08 DIAGNOSIS — F17200 Nicotine dependence, unspecified, uncomplicated: Secondary | ICD-10-CM | POA: Diagnosis not present

## 2021-09-08 DIAGNOSIS — R1011 Right upper quadrant pain: Secondary | ICD-10-CM | POA: Diagnosis present

## 2021-09-08 DIAGNOSIS — R8289 Other abnormal findings on cytological and histological examination of urine: Secondary | ICD-10-CM | POA: Insufficient documentation

## 2021-09-08 LAB — URINALYSIS, ROUTINE W REFLEX MICROSCOPIC
Bilirubin Urine: NEGATIVE
Glucose, UA: NEGATIVE mg/dL
Ketones, ur: 5 mg/dL — AB
Nitrite: NEGATIVE
Protein, ur: 30 mg/dL — AB
Specific Gravity, Urine: 1.026 (ref 1.005–1.030)
Squamous Epithelial / HPF: >50 — ABNORMAL HIGH (ref 0–5)
pH: 5 (ref 5.0–8.0)

## 2021-09-08 LAB — CBC
HCT: 44.9 % (ref 36.0–46.0)
Hemoglobin: 15.3 g/dL — ABNORMAL HIGH (ref 12.0–15.0)
MCH: 34.4 pg — ABNORMAL HIGH (ref 26.0–34.0)
MCHC: 34.1 g/dL (ref 30.0–36.0)
MCV: 100.9 fL — ABNORMAL HIGH (ref 80.0–100.0)
Platelets: 258 10*3/uL (ref 150–400)
RBC: 4.45 MIL/uL (ref 3.87–5.11)
RDW: 11.9 % (ref 11.5–15.5)
WBC: 8.3 10*3/uL (ref 4.0–10.5)
nRBC: 0 % (ref 0.0–0.2)

## 2021-09-08 LAB — PREGNANCY, URINE: Preg Test, Ur: NEGATIVE

## 2021-09-08 LAB — COMPREHENSIVE METABOLIC PANEL
ALT: 18 U/L (ref 0–44)
AST: 18 U/L (ref 15–41)
Albumin: 4.5 g/dL (ref 3.5–5.0)
Alkaline Phosphatase: 56 U/L (ref 38–126)
Anion gap: 8 (ref 5–15)
BUN: 8 mg/dL (ref 6–20)
CO2: 22 mmol/L (ref 22–32)
Calcium: 9.5 mg/dL (ref 8.9–10.3)
Chloride: 110 mmol/L (ref 98–111)
Creatinine, Ser: 0.75 mg/dL (ref 0.44–1.00)
GFR, Estimated: >60 mL/min (ref >60)
Glucose, Bld: 96 mg/dL (ref 70–99)
Potassium: 3.5 mmol/L (ref 3.5–5.1)
Sodium: 140 mmol/L (ref 135–145)
Total Bilirubin: 0.6 mg/dL (ref 0.3–1.2)
Total Protein: 8.1 g/dL (ref 6.5–8.1)

## 2021-09-08 LAB — LIPASE, BLOOD: Lipase: 23 U/L (ref 11–51)

## 2021-09-08 MED ORDER — ONDANSETRON 4 MG PO TBDP
4.0000 mg | ORAL_TABLET | Freq: Once | ORAL | Status: AC
  Administered 2021-09-08: 4 mg via ORAL
  Filled 2021-09-08: qty 1

## 2021-09-08 MED ORDER — IOHEXOL 300 MG/ML  SOLN
100.0000 mL | Freq: Once | INTRAMUSCULAR | Status: AC | PRN
  Administered 2021-09-08: 100 mL via INTRAVENOUS

## 2021-09-08 MED ORDER — POLYETHYLENE GLYCOL 3350 17 G PO PACK: 17.0000 g | PACK | Freq: Every day | ORAL | 0 refills | Status: DC

## 2021-09-08 MED ORDER — KETOROLAC TROMETHAMINE 30 MG/ML IJ SOLN
15.0000 mg | Freq: Once | INTRAMUSCULAR | Status: AC
  Administered 2021-09-08: 15 mg via INTRAVENOUS
  Filled 2021-09-08 (×2): qty 1

## 2021-09-08 NOTE — ED Triage Notes (Signed)
Patient c/o shoulder, back, and abdominal pain beginning yesterday. Patient reports last BM 2 days ago with urge but inability to defecate.

## 2021-09-08 NOTE — Discharge Instructions (Signed)
The CAT scan of your abdomen did not show any abnormality.  Your constipation could be contributing to your pain.  Please start taking the MiraLAX once to twice daily until you are regular.

## 2021-09-08 NOTE — ED Provider Notes (Signed)
[facility] Provider Note    Event Date/Time   First MD Initiated Contact with Patient 09/08/21 1345     (approximate)   History   Abdominal Pain   HPI  Kendra [name] is a 31 y.o. female with past medical history of anxiety, depression, syncopal episodes who presents with abdominal pain.  Symptoms started yesterday.  Pain is located in the right side of her abdomen is rather constant.  She has associated constipation, has had hard stools which she describes as pellets.  Has had nausea but no vomiting.  Denies urinary symptoms or abnormal vaginal discharge.  Denies fevers or chills.  Note patient has had multiple recent ED visits for chest pain palpitations and weakness.  She is currently following with cardiology and neurology.  Patient is on Suboxone for maintenance therapy.    Past Medical History:  Diagnosis Date   Anxiety    Depression    Headache    stress   History of cannabis abuse    Seizures ([place])    age 42. syncopal episode. Hit head. had seizure.    Tobacco abuse     Patient Active Problem List   Diagnosis Date Noted   Noninfectious diarrhea    Non-intractable cyclical vomiting with nausea    Diarrhea    Anxiety and depression 01/19/2015   History of cannabis dependence/abuse ([place]) 01/19/2015   Tobacco abuse 01/19/2015     Physical Exam  Triage Vital Signs: ED Triage Vitals  Enc Vitals Group     BP 09/08/21 1303 (!) 137/100     Pulse Rate 09/08/21 1303 91     Resp 09/08/21 1303 16     Temp 09/08/21 1303 98.4 F (36.9 C)     Temp Source 09/08/21 1303 Oral     SpO2 09/08/21 1303 100 %     Weight 09/08/21 1304 220 lb (99.8 kg)     Height 09/08/21 1304 5\' 7"  (1.702 m)     Head Circumference --      Peak Flow --      Pain Score 09/08/21 1304 8     Pain Loc --      Pain Edu? --      Excl. in [place]? --     Most recent vital signs: Vitals:   09/08/21 1557 09/08/21 1601  BP:  130/84  Pulse: 72   Resp: 20 18  Temp:  99.3 F (37.4 C)   SpO2: 100%      General: Awake, no distress.  CV:  Good peripheral perfusion.  Resp:  Normal effort.  Abd:  No distention.  Mild tenderness to palpation in the right upper quadrant and right lower quadrant, no guarding Neuro:             Awake, Alert, Oriented x 3  Other:     ED Results / Procedures / Treatments  Labs (all labs ordered are listed, but only abnormal results are displayed) Labs Reviewed  CBC - Abnormal; Notable for the following components:      Result Value   Hemoglobin 15.3 (*)    MCV 100.9 (*)    MCH 34.4 (*)    All other components within normal limits  URINALYSIS, ROUTINE W REFLEX MICROSCOPIC - Abnormal; Notable for the following components:   Color, Urine AMBER (*)    APPearance TURBID (*)    Hgb urine dipstick LARGE (*)    Ketones, ur 5 (*)    Protein, ur 30 (*)  Leukocytes,Ua SMALL (*)    Bacteria, UA MANY (*)    Squamous Epithelial / LPF >50 (*)    All other components within normal limits  LIPASE, BLOOD  COMPREHENSIVE METABOLIC PANEL  PREGNANCY, URINE  POC URINE PREG, ED     EKG     RADIOLOGY Reviewed the CT abdomen pelvis myself, no acute abnormality   PROCEDURES:  Critical Care performed: No    MEDICATIONS ORDERED IN ED: Medications  ketorolac (TORADOL) 30 MG/ML injection 15 mg (15 mg Intravenous Given 09/08/21 1522)  ondansetron (ZOFRAN-ODT) disintegrating tablet 4 mg (4 mg Oral Given 09/08/21 1522)  iohexol (OMNIPAQUE) 300 MG/ML solution 100 mL (100 mLs Intravenous Contrast Given 09/08/21 1442)     IMPRESSION / MDM / ASSESSMENT AND PLAN / ED COURSE  I reviewed the triage vital signs and the nursing notes.                              Differential diagnosis includes, but is not limited to, constipation, appendicitis, diverticulitis, cholecystitis, colitis  The patient is a 31 year old female presents with 1 day of abdominal pain.  Is primarily located in the right lower quadrant and is associated with  constipation and nausea.  Vital signs within normal limits.  On exam overall she appears comfortable.  Abdominal exam is notable for some tenderness primarily in the right lower quadrant with some right upper quadrant tenderness as well.  Reviewed her labs which are overall reassuring, she has no leukocytosis LFTs and lipase are within normal limits. UA has many bacteria 21-50 WBCs and RBCs but greater than 50 squames.  She has no urinary symptoms so will not treat.  Will obtain a CT of the abdomen pelvis to rule out appendicitis primarily.   Patient CT of her abdomen is without acute abnormality.  Will start on MiraLAX for constipation.  She is stable for discharge   FINAL CLINICAL IMPRESSION(S) / ED DIAGNOSES   Final diagnoses:  Constipation, unspecified constipation type  Abdominal pain, unspecified abdominal location     Rx / DC Orders   ED Discharge Orders          Ordered    polyethylene glycol (MIRALAX) 17 g packet  Daily        09/08/21 1545             Note:  This document was prepared using Dragon voice recognition software and may include unintentional dictation errors.   [name], MD 09/08/21 [phone]

## 2021-09-11 ENCOUNTER — Ambulatory Visit
Admission: RE | Admit: 2021-09-11 | Discharge: 2021-09-11 | Disposition: A | Discharge status: Home / Self Care | Payer: Medicaid Other | Source: Ambulatory Visit | Attending: Physician Assistant | Admitting: Physician Assistant

## 2021-09-11 ENCOUNTER — Other Ambulatory Visit: Payer: Self-pay

## 2021-09-11 ENCOUNTER — Encounter: Payer: Self-pay | Admitting: Emergency Medicine

## 2021-09-11 ENCOUNTER — Emergency Department
Admission: EM | Admit: 2021-09-11 | Discharge: 2021-09-11 | Disposition: A | Discharge status: Home / Self Care | Payer: Medicaid Other | Attending: Emergency Medicine | Admitting: Emergency Medicine

## 2021-09-11 DIAGNOSIS — R0789 Other chest pain: Secondary | ICD-10-CM | POA: Insufficient documentation

## 2021-09-11 DIAGNOSIS — G35D Multiple sclerosis, unspecified: Secondary | ICD-10-CM

## 2021-09-11 DIAGNOSIS — R109 Unspecified abdominal pain: Secondary | ICD-10-CM | POA: Diagnosis not present

## 2021-09-11 DIAGNOSIS — M25512 Pain in left shoulder: Secondary | ICD-10-CM | POA: Insufficient documentation

## 2021-09-11 DIAGNOSIS — G35 Multiple sclerosis: Secondary | ICD-10-CM | POA: Diagnosis present

## 2021-09-11 LAB — CBC
HCT: 41.3 % (ref 36.0–46.0)
Hemoglobin: 13.8 g/dL (ref 12.0–15.0)
MCH: 33.5 pg (ref 26.0–34.0)
MCHC: 33.4 g/dL (ref 30.0–36.0)
MCV: 100.2 fL — ABNORMAL HIGH (ref 80.0–100.0)
Platelets: 304 10*3/uL (ref 150–400)
RBC: 4.12 MIL/uL (ref 3.87–5.11)
RDW: 12.1 % (ref 11.5–15.5)
WBC: 7.8 10*3/uL (ref 4.0–10.5)
nRBC: 0 % (ref 0.0–0.2)

## 2021-09-11 LAB — COMPREHENSIVE METABOLIC PANEL
ALT: 16 U/L (ref 0–44)
AST: 16 U/L (ref 15–41)
Albumin: 4.3 g/dL (ref 3.5–5.0)
Alkaline Phosphatase: 51 U/L (ref 38–126)
Anion gap: 4 — ABNORMAL LOW (ref 5–15)
BUN: 8 mg/dL (ref 6–20)
CO2: 28 mmol/L (ref 22–32)
Calcium: 9.6 mg/dL (ref 8.9–10.3)
Chloride: 105 mmol/L (ref 98–111)
Creatinine, Ser: 0.76 mg/dL (ref 0.44–1.00)
GFR, Estimated: >60 mL/min (ref >60)
Glucose, Bld: 103 mg/dL — ABNORMAL HIGH (ref 70–99)
Potassium: 4.4 mmol/L (ref 3.5–5.1)
Sodium: 137 mmol/L (ref 135–145)
Total Bilirubin: 0.5 mg/dL (ref 0.3–1.2)
Total Protein: 7.7 g/dL (ref 6.5–8.1)

## 2021-09-11 LAB — LIPASE, BLOOD: Lipase: 26 U/L (ref 11–51)

## 2021-09-11 MED ORDER — GADOBUTROL 1 MMOL/ML IV SOLN
10.0000 mL | Freq: Once | INTRAVENOUS | Status: AC | PRN
  Administered 2021-09-11: 10 mL via INTRAVENOUS

## 2021-09-11 NOTE — ED Triage Notes (Signed)
C/O N/V, abdominal pain x 1 hour.  Also some left shoulder pain.    AAOx3.  Skin warm and dry. NAD

## 2021-09-11 NOTE — ED Provider Notes (Signed)
[facility] Provider Note    Event Date/Time   First MD Initiated Contact with Patient 09/11/21 1611     (approximate)   History   Abdominal Pain   HPI  Kendra [name] is a 31 y.o. female with past medical history of anxiety, depression, seizure disorder, Suboxone use for maintenance therapy presents with shoulder pain and chest pain.  Patient has been seen by multiple ED providers in the last 2 months.  I saw her 3 days ago for abdominal pain with a negative CT and she was seen 2 days prior to that with a negative CTA to rule out pulmonary embolism.  She started having pain in the left shoulder radiating to left chest about 1 hour ago.  She denies shortness of breath nausea or diaphoresis.  Has had similar pain but on the right side before.  Denies abdominal pain despite initial triage complaint.  She did have 1 episode of vomiting.  Patient is currently following with neurology and has an MRI of her brain and C-spine this afternoon for evaluation of neck pain and also following with cardiology.  Notes that she has been struggling with multiple different symptoms over the last 2 months and she gets quite fearful when they come on which makes her come to the emergency department.    Past Medical History:  Diagnosis Date   Anxiety    Depression    Headache    stress   History of cannabis abuse    Seizures (HCC)    age 43. syncopal episode. Hit head. had seizure.    Tobacco abuse     Patient Active Problem List   Diagnosis Date Noted   Noninfectious diarrhea    Non-intractable cyclical vomiting with nausea    Diarrhea    Anxiety and depression 01/19/2015   History of cannabis dependence/abuse (HCC) 01/19/2015   Tobacco abuse 01/19/2015     Physical Exam  Triage Vital Signs: ED Triage Vitals [09/11/21 1420]  Enc Vitals Group     BP      Pulse      Resp      Temp      Temp src      SpO2      Weight 220 lb 0.3 oz (99.8 kg)     Height 5\' 7"   (1.702 m)     Head Circumference      Peak Flow      Pain Score 7     Pain Loc      Pain Edu?      Excl. in GC?     Most recent vital signs: Vitals:   09/11/21 1648  BP: 127/84  Pulse: 82  Resp: 17  Temp: 99 F (37.2 C)  SpO2: 99%     General: Awake, no distress.  CV:  Good peripheral perfusion.  Resp:  Normal effort.  Abd:  No distention.  Neuro:             Awake, Alert, Oriented x 3  Other:  Mild tenderness over the left trap and left anterior shoulder, normal range of motion, mild tenderness to palpation over the left anterior chest wall   ED Results / Procedures / Treatments  Labs (all labs ordered are listed, but only abnormal results are displayed) Labs Reviewed  COMPREHENSIVE METABOLIC PANEL - Abnormal; Notable for the following components:      Result Value   Glucose, Bld 103 (*)    Anion gap 4 (*)  All other components within normal limits  CBC - Abnormal; Notable for the following components:   MCV 100.2 (*)    All other components within normal limits  LIPASE, BLOOD     EKG  EKG interpretation performed by myself: NSR, nml axis, nml intervals, no acute ischemic changes    RADIOLOGY    PROCEDURES:     MEDICATIONS ORDERED IN ED: Medications - No data to display   IMPRESSION / MDM / ASSESSMENT AND PLAN / ED COURSE  I reviewed the triage vital signs and the nursing notes.                              Differential diagnosis includes, but is not limited to, anxiety, somatic symptom disorder, illness anxiety disorder, PE, ACS, musculoskeletal  This patient is a 31 year old female presenting with left shoulder pain and left chest pain.  Patient displaying multiple somatic symptoms over the last 2 months and has had multiple ED visits with negative work-ups.  I last obtained a CT abdomen on her 3 days ago for abdominal pain which was negative and 2 days prior to that she had a CTA that was negative for PE.  She has had multiple negative EKGs  and negative troponins.  She is currently following with cardiology and neurology.  Had a long discussion with the patient regarding potential anxiety contributing to her symptoms and she does recognize this and understand this but the is Aline August very fearful when symptoms come on and is unable to deal with this at home by herself.  Given her recent negative work-ups do not feel that additional imaging is necessary today.  I reviewed her EKG which has no concerning signs.  Suspicion for ACS is exceedingly low, CMP CBC and lipase within normal limits.  Ultimately I encouraged her to follow-up with her specialists and primary care provider and to be reassured by her negative work-ups.  We did discuss continuing with cognitive behavioral therapy and discussing other antianxiety medications with her physician as well.       FINAL CLINICAL IMPRESSION(S) / ED DIAGNOSES   Final diagnoses:  Left shoulder pain, unspecified chronicity     Rx / DC Orders   ED Discharge Orders     None        Note:  This document was prepared using Dragon voice recognition software and may include unintentional dictation errors.   [name], MD 09/11/21 2212

## 2021-09-11 NOTE — Discharge Instructions (Signed)
Your EKG and blood work were all reassuring.  Please follow-up with your primary care provider, your cardiologist and your neurologist.

## 2021-09-13 ENCOUNTER — Other Ambulatory Visit: Payer: Self-pay | Admitting: Obstetrics and Gynecology

## 2021-09-13 ENCOUNTER — Other Ambulatory Visit: Payer: Self-pay

## 2021-09-13 NOTE — Patient Outreach (Deleted)
Medicaid Managed Care   Nurse Care Manager Note  09/13/2021 Name:  HANNELORE ALOI MRN:  [id number] DOB:  1990-09-19  Kendra [name] is an 31 y.o. year old female who is a primary patient of [name], [place].  The [facility] Managed Care Coordination team was consulted for assistance with:    {MMRNREFERRALCHOICES:25042}  Ms. Baldy was given information about Medicaid Managed Care Coordination team services today. [name] {MMAMBCONSENTCHOICES:26158}  Engaged with patient {MMTELEPHONEFACETOFACE:25045} for {MMINITIALFOLLOWUPCHOICE:25046} in response to provider referral for case management and/or care coordination services.   Assessments/Interventions:  Review of past medical history, allergies, medications, health status, including review of consultants reports, laboratory and other test data, was performed as part of comprehensive evaluation and provision of chronic care management services.  SDOH (Social Determinants of Health) assessments and interventions performed: SDOH Interventions    Flowsheet Row Most Recent Value  SDOH Interventions   Intimate Partner Violence Interventions Intervention Not Indicated  Transportation Interventions Intervention Not Indicated       Care Plan  Allergies  Allergen Reactions   Penicillins     Unknown reaction - STATES CAN TAKE MED NOW    Medications Reviewed Today     Reviewed by [name], RN (Registered Nurse) on 09/13/21 at 1213  Med List Status: <None>   Medication Order Taking? Sig Documenting Provider Last Dose Status Informant  albuterol (VENTOLIN HFA) 108 (90 Base) MCG/ACT inhaler [id number] No Inhale 2 puffs into the lungs every 6 (six) hours as needed for wheezing or shortness of breath.  Patient not taking: Reported on 09/13/2021   Rudene Re, MD Not Taking Active   azithromycin [facility]) 250 MG tablet [id number] No Take 1 a day for 4 days  Patient not taking: Reported on 09/13/2021   Rudene Re, MD Not Taking Active   buprenorphine-naloxone (SUBOXONE) 8-2 mg SUBL SL tablet [id number] Yes Place 1 tablet under the tongue daily. Patient uses 8mg  film bid [provider]  Active Self  dicyclomine (BENTYL) 10 MG capsule [id number]  Take 1 capsule (10 mg total) by mouth 4 (four) times daily. [name], MD  Expired 12/18/15 2359 Self  dicyclomine (BENTYL) 20 MG tablet [id number] No Take 1 tablet (20 mg total) by mouth 4 (four) times daily -  before meals and at bedtime.  Patient not taking: Reported on 09/13/2021   Lucilla Lame, MD Not Taking Active   hydrOXYzine (ATARAX) 25 MG tablet [id number] No Take 1 tablet (25 mg total) by mouth 3 (three) times daily as needed for anxiety.  Patient not taking: Reported on 09/13/2021   Carrie Mew, MD Not Taking Active   loperamide (IMODIUM A-D) 2 MG tablet [id number] No Take 2 tablets (4 mg total) by mouth 4 (four) times daily as needed for diarrhea or loose stools.  Patient not taking: Reported on 12/14/2015   Carrie Mew, MD Not Taking Active   LORazepam (ATIVAN) 1 MG tablet [id number] No Take 1 mg by mouth every 8 (eight) hours as needed for anxiety.  Patient not taking: Reported on 09/13/2021   [provider] Not Taking Active   metroNIDAZOLE (FLAGYL) 500 MG tablet [id number] No Take 500 mg by mouth 2 (two) times daily.  Patient not taking: Reported on 09/13/2021   [provider] Not Taking Active Self  nitrofurantoin (MACRODANTIN) 100 MG capsule [id number] No Take 100 mg by mouth 2 (two) times daily.  Patient not taking: Reported on 09/13/2021   [provider] Not Taking Active  ondansetron (ZOFRAN ODT) 4 MG disintegrating tablet [id number] No Take 1 tablet (4 mg total) by mouth every 8 (eight) hours as needed for nausea or vomiting.  Patient not taking: Reported on 12/12/2015   Carrie Mew, MD Not Taking Active   polyethylene glycol (MIRALAX) 17 g packet [id number] Yes Take 17 g by mouth  daily. [name], MD Taking Active   polyethylene glycol-electrolytes (TRILYTE) 420 g solution [id number] No Take 4,000 mLs by mouth as directed. Drink one 8 oz glass every 20 mins until stools are clear.  Patient not taking: Reported on 09/13/2021   Lucilla Lame, MD Not Taking Active   promethazine (PHENERGAN) 25 MG suppository [id number] No Place 1 suppository (25 mg total) rectally every 6 (six) hours as needed for nausea or vomiting.  Patient not taking: Reported on 09/13/2021   Lucilla Lame, MD Not Taking Active   promethazine (PHENERGAN) 25 MG tablet [id number] No Take 0.5 tablets (12.5 mg total) by mouth every 6 (six) hours as needed for nausea or vomiting.  Patient not taking: Reported on 09/13/2021   Merlyn Lot, MD Not Taking Active   ranitidine (ZANTAC) 150 MG capsule [id number] No Take 1 capsule (150 mg total) by mouth 2 (two) times daily.  Patient not taking: Reported on 12/12/2015   Carrie Mew, MD Not Taking Active   sertraline (ZOLOFT) 100 MG tablet [id number]  Take 1 tablet (100 mg total) by mouth daily.  Patient not taking: Reported on 12/12/2015   Rubie Maid, MD  Active Self  sertraline (ZOLOFT) 50 MG tablet [id number] Yes Take 50 mg by mouth daily. [provider]  Active Self            Patient Active Problem List   Diagnosis Date Noted   Noninfectious diarrhea    Non-intractable cyclical vomiting with nausea    Diarrhea    Anxiety and depression 01/19/2015   History of cannabis dependence/abuse ([place]) 01/19/2015   Tobacco abuse 01/19/2015    Conditions to be addressed/monitored per PCP order:  {CCM ASSESSMENT DZ OPTIONS:25047}  Care Plan : RN Care Manager Plan of Care  Updates made by [name], RN since 09/13/2021 12:00 AM     Problem: RNCM Effective Management of patient needs   Priority: High     Goal: Self-Management Plan Developed   Note:   Current Barriers:  Knowledge Deficits related to plan of care for management of  Anxiety   RNCM Clinical Goal(s):  Patient will verbalize understanding of plan for management of Anxiety as evidenced by taking prescribed medications and continuing therapy take all medications exactly as prescribed and will call provider for medication related questions demonstrate understanding of rationale for each prescribed medication attend all scheduled medical appointments: continue to work with RN Care Manager to address care management and care coordination needs related to  Anxiety as evidenced by adherence to CM Team Scheduled appointments through collaboration with RN Care manager, provider, and care team.   Interventions: Inter-disciplinary care team collaboration (see longitudinal plan of care) Evaluation of current treatment plan related to  self management and patient's adherence to plan as established by provider   (Status:  New goal.)  Long Term Goal Evaluation of current treatment plan related to Anxiety Discussed plans with patient for ongoing care management follow up and provided patient with direct contact information for care management team Evaluation of current treatment plan related to anxiety and patient's adherence to plan as established by provider Reviewed medications with patient Reviewed scheduled/upcoming  provider appointments Discussed plans with patient for ongoing care management follow up and provided patient with direct contact information for care management team Screening for signs and symptoms of depression related to chronic disease state  Assessed social determinant of health barriers  Patient Goals/Self-Care Activities: Take all medications as prescribed Attend all scheduled provider appointments Call pharmacy for medication refills 3-7 days in advance of running out of medications Perform all self care activities independently  Perform IADL's (shopping, preparing meals, housekeeping, managing finances) independently Call provider office for  new concerns or questions   Follow Up Plan:  The care management team will reach out to the patient again over the next 30 days.     Task: Mutually Develop and Royce Macadamia Achievement of Patient Goals   Note:   Care Management Activities:    {Mutually Develop and Royce Macadamia Achievement of Patient Goals (Clinical) (General Plan of Care):24574}    Notes:     Long-Range Goal: Rstablish Plan of Care for Disease Management Needs   Start Date: 09/13/2021  Expected End Date: 12/11/2021  Priority: High  Note:   Timeframe:  Long-Range Goal Priority:  High Start Date:        09/13/21                     Expected End Date:      ongoing                 Follow Up Date 10/11/21    - schedule appointment for flu shot - schedule appointment for vaccines needed due to my age or health - schedule recommended health tests (blood work, mammogram, colonoscopy, pap test) - schedule and keep appointment for annual check-up    Why is this important?   Screening tests can find diseases early when they are easier to treat.  Your doctor or nurse will talk with you about which tests are important for you.  Getting shots for common diseases like the flu and shingles will help prevent them.        Follow Up:  {FOLLOWUP:24991}  Plan: {MANAGED MEDICAID FOLLOW UP PB:7626032  Date/time of next scheduled RN care management/care coordination outreach:  ***  ***

## 2021-09-13 NOTE — Patient Outreach (Signed)
Medicaid Managed Care   Nurse Care Manager Note  09/13/2021 Name:  Kendra [name] MRN:  [id number] DOB:  23-Aug-1990  Kendra [name] is an 31 y.o. year old female who is a primary patient of Kendra [name], Kendra [name].  The [facility] Managed Care Coordination team was consulted for assistance with:    Anxiety  Kendra [name] was given information about Medicaid Managed Care Coordination team services today. [name] Patient agreed to services and verbal consent obtained.  Engaged with patient by telephone for initial visit in response to provider referral for case management and/or care coordination services.   Assessments/Interventions:  Review of past medical history, allergies, medications, health status, including review of consultants reports, laboratory and other test data, was performed as part of comprehensive evaluation and provision of chronic care management services.  SDOH (Social Determinants of Health) assessments and interventions performed: SDOH Interventions    Flowsheet Row Most Recent Value  SDOH Interventions   Intimate Partner Violence Interventions Intervention Not Indicated  Transportation Interventions Intervention Not Indicated      Care Plan  Allergies  Allergen Reactions   Penicillins     Unknown reaction - STATES CAN TAKE MED NOW    Medications Reviewed Today     Reviewed by Kendra Chandler, RN (Registered Nurse) on 09/13/21 at 1213  Med List Status: <None>   Medication Order Taking? Sig Documenting Provider Last Dose Status Informant  albuterol (VENTOLIN HFA) 108 (90 Base) MCG/ACT inhaler [id number] No Inhale 2 puffs into the lungs every 6 (six) hours as needed for wheezing or shortness of breath.  Patient not taking: Reported on 09/13/2021   Kendra Sickle, MD Not Taking Active   azithromycin [facility]) 250 MG tablet [id number] No Take 1 a day for 4 days  Patient not taking: Reported on 09/13/2021   Kendra Sickle, MD Not Taking  Active   buprenorphine-naloxone (SUBOXONE) 8-2 mg SUBL SL tablet [id number] Yes Place 1 tablet under the tongue daily. Patient uses 8mg  film bid [provider]  Active Self  dicyclomine (BENTYL) 10 MG capsule  Take 1 capsule (10 mg total) by mouth 4 (four) times daily. [id number], MD  Expired 12/18/15 2359 Self  dicyclomine (BENTYL) 20 MG tablet 12/20/15 No Take 1 tablet (20 mg total) by mouth 4 (four) times daily -  before meals and at bedtime.  Patient not taking: Reported on 09/13/2021   09/15/2021, MD Not Taking Active   hydrOXYzine (ATARAX) 25 MG tablet Kendra [name] No Take 1 tablet (25 mg total) by mouth 3 (three) times daily as needed for anxiety.  Patient not taking: Reported on 09/13/2021   09/15/2021, MD Not Taking Active   loperamide (IMODIUM A-D) 2 MG tablet Kendra [name] No Take 2 tablets (4 mg total) by mouth 4 (four) times daily as needed for diarrhea or loose stools.  Patient not taking: Reported on 12/14/2015   12/16/2015, MD Not Taking Active   LORazepam (ATIVAN) 1 MG tablet Kendra [name] No Take 1 mg by mouth every 8 (eight) hours as needed for anxiety.  Patient not taking: Reported on 09/13/2021   [provider] Not Taking Active   metroNIDAZOLE (FLAGYL) 500 MG tablet 09/15/2021 No Take 500 mg by mouth 2 (two) times daily.  Patient not taking: Reported on 09/13/2021   [provider] Not Taking Active Self  nitrofurantoin (MACRODANTIN) 100 MG capsule 09/15/2021 No Take 100 mg by mouth 2 (two) times daily.  Patient not taking: Reported on 09/13/2021  [provider] Not Taking Active   ondansetron (ZOFRAN ODT) 4 MG disintegrating tablet [id number] No Take 1 tablet (4 mg total) by mouth every 8 (eight) hours as needed for nausea or vomiting.  Patient not taking: Reported on 12/12/2015   Kendra Cheek, MD Not Taking Active   polyethylene glycol (MIRALAX) 17 g packet [id number] Yes Take 17 g by mouth daily. [name],  MD Taking Active   polyethylene glycol-electrolytes (TRILYTE) 420 g solution [id number] No Take 4,000 mLs by mouth as directed. Drink one 8 oz glass every 20 mins until stools are clear.  Patient not taking: Reported on 09/13/2021   Kendra Minium, MD Not Taking Active   promethazine (PHENERGAN) 25 MG suppository [id number] No Place 1 suppository (25 mg total) rectally every 6 (six) hours as needed for nausea or vomiting.  Patient not taking: Reported on 09/13/2021   Kendra Minium, MD Not Taking Active   promethazine (PHENERGAN) 25 MG tablet [id number] No Take 0.5 tablets (12.5 mg total) by mouth every 6 (six) hours as needed for nausea or vomiting.  Patient not taking: Reported on 09/13/2021   Kendra Eddy, MD Not Taking Active   ranitidine (ZANTAC) 150 MG capsule [id number] No Take 1 capsule (150 mg total) by mouth 2 (two) times daily.  Patient not taking: Reported on 12/12/2015   Kendra Cheek, MD Not Taking Active   sertraline (ZOLOFT) 100 MG tablet [id number]  Take 1 tablet (100 mg total) by mouth daily.  Patient not taking: Reported on 12/12/2015   Kendra Laser, MD  Active Self  sertraline (ZOLOFT) 50 MG tablet [id number] Yes Take 50 mg by mouth daily. [provider]  Active Self            Patient Active Problem List   Diagnosis Date Noted   Noninfectious diarrhea    Non-intractable cyclical vomiting with nausea    Diarrhea    Anxiety and depression 01/19/2015   History of cannabis dependence/abuse (HCC) 01/19/2015   Tobacco abuse 01/19/2015    Conditions to be addressed/monitored per PCP order:  Anxiety  Care Plan : RN Care Manager Plan of Care  Updates made by Kendra Chandler, RN since 09/13/2021 12:00 AM     Problem: RNCM Effective Management of patient needs   Priority: High     Goal: Self-Management Plan Developed   Note:   Current Barriers:  Knowledge Deficits related to plan of care for management of Anxiety   RNCM Clinical Goal(s):  Patient will  verbalize understanding of plan for management of Anxiety as evidenced by taking prescribed medications and continuing therapy take all medications exactly as prescribed and will call provider for medication related questions demonstrate understanding of rationale for each prescribed medication attend all scheduled medical appointments: continue to work with RN Care Manager to address care management and care coordination needs related to  Anxiety as evidenced by adherence to CM Team Scheduled appointments through collaboration with RN Care manager, provider, and care team.   Interventions: Inter-disciplinary care team collaboration (see longitudinal plan of care) Evaluation of current treatment plan related to  self management and patient's adherence to plan as established by provider   (Status:  New goal.)  Long Term Goal Evaluation of current treatment plan related to Anxiety Discussed plans with patient for ongoing care management follow up and provided patient with direct contact information for care management team Evaluation of current treatment plan related to anxiety and patient's adherence to plan as established by provider Reviewed  medications with patient Reviewed scheduled/upcoming provider appointments Discussed plans with patient for ongoing care management follow up and provided patient with direct contact information for care management team Screening for signs and symptoms of depression related to chronic disease state  Assessed social determinant of health barriers  Patient Goals/Self-Care Activities: Take all medications as prescribed Attend all scheduled provider appointments Call pharmacy for medication refills 3-7 days in advance of running out of medications Perform all self care activities independently  Perform IADL's (shopping, preparing meals, housekeeping, managing finances) independently Call provider office for new concerns or questions   Follow Up Plan:   The care management team will reach out to the patient again over the next 30 days.    Long-Range Goal: Rstablish Plan of Care for Disease Management Needs   Start Date: 09/13/2021  Expected End Date: 12/11/2021  Priority: High  Note:   Timeframe:  Long-Range Goal Priority:  High Start Date:        09/13/21                     Expected End Date:      ongoing                 Follow Up Date 10/11/21    - schedule appointment for flu shot - schedule appointment for vaccines needed due to my age or health - schedule recommended health tests (blood work, mammogram, colonoscopy, pap test) - schedule and keep appointment for annual check-up    Why is this important?   Screening tests can find diseases early when they are easier to treat.  Your doctor or nurse will talk with you about which tests are important for you.  Getting shots for common diseases like the flu and shingles will help prevent them.     Follow Up:  Patient agrees to Care Plan and Follow-up.  Plan: The Managed Medicaid care management team will reach out to the patient again over the next 30 days. and The  Patient has been provided with contact information for the Managed Medicaid care management team and has been advised to call with any health related questions or concerns.  Date/time of next scheduled RN care management/care coordination outreach:  10/11/21 at 145

## 2021-09-13 NOTE — Patient Instructions (Signed)
Hi Ms. Blaydes, thank you for speaking with me today, I hope you have a nice afternoon!  Ms. Kleinberg was given information about Medicaid Managed Care team care coordination services as a part of their [facility] Medicaid benefit. [name] verbally consented to engagement with the [facility] Managed Care team.   If you are experiencing a medical emergency, please call 911 or report to your local emergency department or urgent care.   If you have a non-emergency medical problem during routine business hours, please contact your provider's office and ask to speak with a nurse.   For questions related to your [facility] health plan, please call: [phone] or go here:[email or web address]  If you would like to schedule transportation through your [facility] plan, please call the following number at least 2 days in advance of your appointment: [phone].  Call the [facility] at [phone], at any time, 24 hours a day, 7 days a week. If you are in danger or need immediate medical attention call 911.  If you would like help to quit smoking, call 1-800-QUIT-NOW 646-264-3161) OR Espaol: 1-855-Djelo-Ya QO:409462) o para ms informacin haga clic aqu or Text READY to 200-400 to register via text  Ms. Ebony Hail - following are the goals we discussed in your visit today:   Goals Addressed    Timeframe:  Long-Range Goal Priority:  High Start Date:        09/13/21                     Expected End Date:      ongoing                 Follow Up Date 10/11/21    - schedule appointment for flu shot - schedule appointment for vaccines needed due to my age or health - schedule recommended health tests (blood work, mammogram, colonoscopy, pap test) - schedule and keep appointment for annual check-up    Why is this important?   Screening tests can find diseases early when they are easier to treat.  Your doctor or nurse will talk with you about  which tests are important for you.  Getting shots for common diseases like the flu and shingles will help prevent them.  Patient verbalizes understanding of instructions and care plan provided today and agrees to view in [place]. Active MyChart status confirmed with patient.    The Managed Medicaid care management team will reach out to the patient again over the next 30 days.  The  Patient has been provided with contact information for the Managed Medicaid care management team and has been advised to call with any health related questions or concerns.   Aida Raider RN, BSN [place]   [facility] - Managed Medicaid High Risk [phone].   Following is a copy of your plan of care:  Care Plan : [place] of Care  Updates made by [name], RN since 09/13/2021 12:00 AM     Problem: RNCM Effective Management of patient needs   Priority: High     Goal: Self-Management Plan Developed   Note:   Current Barriers:  Knowledge Deficits related to plan of care for management of Anxiety   RNCM Clinical Goal(s):  Patient will verbalize understanding of plan for management of Anxiety as evidenced by taking prescribed medications and continuing therapy take all medications exactly as prescribed and will call provider for medication related questions demonstrate understanding  of rationale for each prescribed medication attend all scheduled medical appointments: continue to work with RN Care Manager to address care management and care coordination needs related to  Anxiety as evidenced by adherence to CM Team Scheduled appointments through collaboration with RN Care manager, provider, and care team.   Interventions: Inter-disciplinary care team collaboration (see longitudinal plan of care) Evaluation of current treatment plan related to  self management and patient's adherence to plan as established by provider   (Status:  New goal.)   Long Term Goal Evaluation of current treatment plan related to Anxiety Discussed plans with patient for ongoing care management follow up and provided patient with direct contact information for care management team Evaluation of current treatment plan related to anxiety and patient's adherence to plan as established by provider Reviewed medications with patient Reviewed scheduled/upcoming provider appointments Discussed plans with patient for ongoing care management follow up and provided patient with direct contact information for care management team Screening for signs and symptoms of depression related to chronic disease state  Assessed social determinant of health barriers  Patient Goals/Self-Care Activities: Take all medications as prescribed Attend all scheduled provider appointments Call pharmacy for medication refills 3-7 days in advance of running out of medications Perform all self care activities independently  Perform IADL's (shopping, preparing meals, housekeeping, managing finances) independently Call provider office for new concerns or questions   Follow Up Plan:  The care management team will reach out to the patient again over the next 30 days.

## 2021-09-14 ENCOUNTER — Other Ambulatory Visit: Payer: Self-pay | Admitting: Physician Assistant

## 2021-09-14 ENCOUNTER — Emergency Department
Admission: EM | Admit: 2021-09-14 | Discharge: 2021-09-14 | Disposition: A | Discharge status: Home / Self Care | Payer: Medicaid Other | Attending: Emergency Medicine | Admitting: Emergency Medicine

## 2021-09-14 ENCOUNTER — Other Ambulatory Visit: Payer: Self-pay

## 2021-09-14 ENCOUNTER — Emergency Department: Payer: Medicaid Other

## 2021-09-14 ENCOUNTER — Encounter: Payer: Self-pay | Admitting: Emergency Medicine

## 2021-09-14 DIAGNOSIS — M7918 Myalgia, other site: Secondary | ICD-10-CM

## 2021-09-14 DIAGNOSIS — R531 Weakness: Secondary | ICD-10-CM

## 2021-09-14 DIAGNOSIS — M542 Cervicalgia: Secondary | ICD-10-CM | POA: Insufficient documentation

## 2021-09-14 DIAGNOSIS — M546 Pain in thoracic spine: Secondary | ICD-10-CM | POA: Diagnosis not present

## 2021-09-14 DIAGNOSIS — R9089 Other abnormal findings on diagnostic imaging of central nervous system: Secondary | ICD-10-CM

## 2021-09-14 DIAGNOSIS — R202 Paresthesia of skin: Secondary | ICD-10-CM

## 2021-09-14 LAB — CBC WITH DIFFERENTIAL/PLATELET
Abs Immature Granulocytes: 0.02 10*3/uL (ref 0.00–0.07)
Basophils Absolute: 0 10*3/uL (ref 0.0–0.1)
Basophils Relative: 0 %
Eosinophils Absolute: 0.1 10*3/uL (ref 0.0–0.5)
Eosinophils Relative: 1 %
HCT: 41.7 % (ref 36.0–46.0)
Hemoglobin: 14 g/dL (ref 12.0–15.0)
Immature Granulocytes: 0 %
Lymphocytes Relative: 25 %
Lymphs Abs: 1.9 10*3/uL (ref 0.7–4.0)
MCH: 33.3 pg (ref 26.0–34.0)
MCHC: 33.6 g/dL (ref 30.0–36.0)
MCV: 99 fL (ref 80.0–100.0)
Monocytes Absolute: 0.5 10*3/uL (ref 0.1–1.0)
Monocytes Relative: 7 %
Neutro Abs: 5.3 10*3/uL (ref 1.7–7.7)
Neutrophils Relative %: 67 %
Platelets: 294 10*3/uL (ref 150–400)
RBC: 4.21 MIL/uL (ref 3.87–5.11)
RDW: 12 % (ref 11.5–15.5)
WBC: 7.8 10*3/uL (ref 4.0–10.5)
nRBC: 0 % (ref 0.0–0.2)

## 2021-09-14 LAB — BASIC METABOLIC PANEL
Anion gap: 5 (ref 5–15)
BUN: 9 mg/dL (ref 6–20)
CO2: 26 mmol/L (ref 22–32)
Calcium: 9.5 mg/dL (ref 8.9–10.3)
Chloride: 105 mmol/L (ref 98–111)
Creatinine, Ser: 0.81 mg/dL (ref 0.44–1.00)
GFR, Estimated: >60 mL/min (ref >60)
Glucose, Bld: 100 mg/dL — ABNORMAL HIGH (ref 70–99)
Potassium: 4.1 mmol/L (ref 3.5–5.1)
Sodium: 136 mmol/L (ref 135–145)

## 2021-09-14 LAB — TROPONIN I (HIGH SENSITIVITY): Troponin I (High Sensitivity): <2 ng/L (ref <18)

## 2021-09-14 MED ORDER — METHOCARBAMOL 500 MG PO TABS: 500.0000 mg | ORAL_TABLET | Freq: Four times a day (QID) | ORAL | 0 refills | Status: DC | PRN

## 2021-09-14 MED ORDER — ETODOLAC 400 MG PO TABS: 400.0000 mg | ORAL_TABLET | Freq: Two times a day (BID) | ORAL | 0 refills | Status: DC

## 2021-09-14 MED ORDER — METHOCARBAMOL 500 MG PO TABS
1000.0000 mg | ORAL_TABLET | Freq: Once | ORAL | Status: AC
  Administered 2021-09-14: 500 mg via ORAL
  Filled 2021-09-14: qty 2

## 2021-09-14 MED ORDER — LIDOCAINE 5 % EX PTCH: 1.0000 | MEDICATED_PATCH | Freq: Two times a day (BID) | CUTANEOUS | 0 refills | Status: DC

## 2021-09-14 MED ORDER — KETOROLAC TROMETHAMINE 30 MG/ML IJ SOLN
30.0000 mg | Freq: Once | INTRAMUSCULAR | Status: AC
Start: 2021-09-14 — End: 2021-09-14
  Administered 2021-09-14: 30 mg via INTRAMUSCULAR
  Filled 2021-09-14: qty 1

## 2021-09-14 NOTE — ED Provider Notes (Signed)
[facility] Provider Note    Event Date/Time   First MD Initiated Contact with Patient 09/14/21 [phone]     (approximate)   History   Neck Pain   HPI  Kendra [name] is a 31 y.o. female presents to the ED with complaint of cervical pain and thoracic pain for approximately 8 weeks.  Patient denies any injury to her neck or upper back.  She states that she took Flexeril at the beginning of the 8 weeks which did not help.  Currently she is not on any medication.  She was seen at [facility] early this morning at which time a prescription for gabapentin was sent to the pharmacy which has not been picked up as of yet.  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  She requests no narcotics.     Physical Exam   Triage Vital Signs: ED Triage Vitals  Enc Vitals Group     BP 09/14/21 0854 (!) 155/94     Pulse Rate 09/14/21 0854 92     Resp 09/14/21 0854 19     Temp 09/14/21 0854 99 F (37.2 C)     Temp Source 09/14/21 0854 Oral     SpO2 09/14/21 0854 98 %     Weight 09/14/21 0856 220 lb (99.8 kg)     Height 09/14/21 0856 5\' 7"  (1.702 m)     Head Circumference --      Peak Flow --      Pain Score 09/14/21 0856 9     Pain Loc --      Pain Edu? --      Excl. in [place]? --     Most recent vital signs: Vitals:   09/14/21 1208 09/14/21 1332  BP: 126/69 (!) 148/79  Pulse: (!) 58 83  Resp: 16 16  Temp:    SpO2:  100%     General: Awake, no distress.  CV:  Good peripheral perfusion.  Heart regular rate and rhythm. Resp:  Normal effort.  Lungs are clear bilaterally. Abd:  No distention.  Other:  On examination of the cervical spine and back there is no gross deformity.  There is moderate tenderness on palpation of the paravertebral muscles of the thoracic spine with minimal tenderness on point palpation of the vertebral bodies of the cervical and thoracic  spine.  Good muscle strength bilaterally at 5/5.  Patient is able to bear weight and is ambulatory without any assistance.  Reflexes are equal lower extremities bilaterally at 2+.   ED Results / Procedures / Treatments   Labs (all labs ordered are listed, but only abnormal results are displayed) Labs Reviewed  BASIC METABOLIC PANEL - Abnormal; Notable for the following components:      Result Value   Glucose, Bld 100 (*)    All other components within normal limits  CBC WITH DIFFERENTIAL/PLATELET  TROPONIN I (HIGH SENSITIVITY)     EKG Vent. rate 71 BPM PR interval 156 ms QRS duration 102 ms QT/QTcB 386/419 ms P-R-T axes 52 62 25   RADIOLOGY Cervical and thoracic spine x-rays were reviewed by myself with no acute bony injuries noted.  Radiology report which was reviewed shows minimal multilevel degenerative changes.  Cervical spine x-ray shows degenerative disc disease at C6-C7.    PROCEDURES:  Critical Care performed:   Procedures   MEDICATIONS ORDERED IN ED: Medications  methocarbamol (ROBAXIN) tablet 1,000 mg (500 mg Oral Given 09/14/21 0947)  ketorolac (TORADOL) 30 MG/ML injection 30 mg (30 mg Intramuscular Given 09/14/21 0948)     IMPRESSION / MDM / ASSESSMENT AND PLAN / ED COURSE  I reviewed the triage vital signs and the nursing notes.   Differential diagnosis includes, but is not limited to, neck cervical pain, degenerative disc disease, muscle skeletal pain, chronic upper back pain.  Anxiety.  Chest pain secondary to thoracic spine pain.  Rule out cardiac origin.  31 year old female presents to the ED with complaint of cervical and upper back pain for 8 weeks.  Patient states that she was seen at [facility] this morning and was prescribed gabapentin which she has not picked up this morning as it was electronically sent at approximately 6 AM.  Patient does see neurology and has an appointment to continue evaluation for her pain and further imaging.   Initially on exam patient was tearful.  She was given Toradol 30 mg IM and methocarbamol 1000 mg p.o.  X-rays showed minimal multilevel degenerative changes in the thoracic spine and cervical spine showed degenerative disc disease C6-C7.  Patient began giving relief of her pain and we also discussed using Lidoderm patches.  She will discontinue taking the Flexeril that she has at home and began with the methocarbamol which she states is helped more along with etodolac 400 mg twice daily and Lidoderm patches.  She is encouraged to keep her appointment with neurology for continued care.  She was given strict return precautions should she develop any severe worsening of her symptoms.  She was also reassured that her troponin was negative at less than 2 and that her EKG did not show any acute changes.  BMP and CBC were all normal.  Patient's pain was decreased prior to discharge and movement was easier.     FINAL CLINICAL IMPRESSION(S) / ED DIAGNOSES   Final diagnoses:  Musculoskeletal pain     Rx / DC Orders   ED Discharge Orders          Ordered    lidocaine (LIDODERM) 5 %  Every 12 hours        09/14/21 1328    etodolac (LODINE) 400 MG tablet  2 times daily        09/14/21 1338    methocarbamol (ROBAXIN) 500 MG tablet  Every 6 hours PRN        09/14/21 1338             Note:  This document was prepared using Dragon voice recognition software and may include unintentional dictation errors.   [name], PA-C 09/14/21 1525    Minna Antis, MD 09/15/21 1407

## 2021-09-14 NOTE — ED Triage Notes (Signed)
Pt in with co neck pain x 8 weeks, states has had MRI but due to insurance is not able to finish workup. Does have prescription for gabapentin but has not gotten it filled. Pt is on suboxone and does not want narcotics.

## 2021-09-14 NOTE — Discharge Instructions (Signed)
Keep your appointment with the neurologist and continue to have studies done to work-up your muscle skeletal pain.  A prescription for the methocarbamol was sent to the pharmacy along with etodolac for your muscle skeletal pain.  You may also use ice or heat to your neck and back as needed.  A prescription for Lidoderm patches were also sent to the pharmacy for you to take and place on the most painful area of your back.

## 2021-09-14 NOTE — ED Notes (Signed)
States she developed chest pain after meds given   Provider aware  new orders given

## 2021-09-14 NOTE — ED Notes (Signed)
See triage note  presents with neck pain  states pain started 8 weeks ago  has been seen by her PCP and recently at [facility] denies any injury  states pain radiates from mid neck into mid back  pt is very tearful on arrival

## 2021-09-15 ENCOUNTER — Other Ambulatory Visit: Payer: Self-pay

## 2021-09-15 ENCOUNTER — Emergency Department: Payer: Medicaid Other

## 2021-09-15 ENCOUNTER — Encounter: Payer: Self-pay | Admitting: Radiology

## 2021-09-15 DIAGNOSIS — Z79899 Other long term (current) drug therapy: Secondary | ICD-10-CM | POA: Diagnosis not present

## 2021-09-15 DIAGNOSIS — R059 Cough, unspecified: Secondary | ICD-10-CM | POA: Insufficient documentation

## 2021-09-15 DIAGNOSIS — R002 Palpitations: Secondary | ICD-10-CM | POA: Diagnosis not present

## 2021-09-15 DIAGNOSIS — Z20822 Contact with and (suspected) exposure to covid-19: Secondary | ICD-10-CM | POA: Diagnosis not present

## 2021-09-15 DIAGNOSIS — R200 Anesthesia of skin: Secondary | ICD-10-CM | POA: Diagnosis not present

## 2021-09-15 DIAGNOSIS — R0789 Other chest pain: Secondary | ICD-10-CM | POA: Insufficient documentation

## 2021-09-15 DIAGNOSIS — M542 Cervicalgia: Secondary | ICD-10-CM | POA: Diagnosis present

## 2021-09-15 DIAGNOSIS — M546 Pain in thoracic spine: Secondary | ICD-10-CM | POA: Insufficient documentation

## 2021-09-15 DIAGNOSIS — K5900 Constipation, unspecified: Secondary | ICD-10-CM | POA: Insufficient documentation

## 2021-09-15 DIAGNOSIS — M5412 Radiculopathy, cervical region: Secondary | ICD-10-CM | POA: Insufficient documentation

## 2021-09-15 LAB — CBC
HCT: 43.1 % (ref 36.0–46.0)
Hemoglobin: 14.2 g/dL (ref 12.0–15.0)
MCH: 32.9 pg (ref 26.0–34.0)
MCHC: 32.9 g/dL (ref 30.0–36.0)
MCV: 99.8 fL (ref 80.0–100.0)
Platelets: 340 10*3/uL (ref 150–400)
RBC: 4.32 MIL/uL (ref 3.87–5.11)
RDW: 11.9 % (ref 11.5–15.5)
WBC: 12 10*3/uL — ABNORMAL HIGH (ref 4.0–10.5)
nRBC: 0 % (ref 0.0–0.2)

## 2021-09-15 NOTE — ED Triage Notes (Signed)
Pt with upper back pain radiating through to chest. Pt states has been present for several days. Pt states does have nausea.

## 2021-09-16 ENCOUNTER — Emergency Department
Admission: EM | Admit: 2021-09-16 | Discharge: 2021-09-16 | Disposition: A | Discharge status: Home / Self Care | Payer: Medicaid Other | Attending: Emergency Medicine | Admitting: Emergency Medicine

## 2021-09-16 ENCOUNTER — Encounter: Payer: Self-pay | Admitting: Emergency Medicine

## 2021-09-16 ENCOUNTER — Other Ambulatory Visit: Payer: Self-pay

## 2021-09-16 ENCOUNTER — Emergency Department
Admission: EM | Admit: 2021-09-16 | Discharge: 2021-09-16 | Disposition: A | Discharge status: Home / Self Care | Payer: Medicaid Other | Source: Home / Self Care | Attending: Emergency Medicine | Admitting: Emergency Medicine

## 2021-09-16 DIAGNOSIS — R059 Cough, unspecified: Secondary | ICD-10-CM | POA: Insufficient documentation

## 2021-09-16 DIAGNOSIS — K5900 Constipation, unspecified: Secondary | ICD-10-CM | POA: Insufficient documentation

## 2021-09-16 DIAGNOSIS — Z20822 Contact with and (suspected) exposure to covid-19: Secondary | ICD-10-CM | POA: Insufficient documentation

## 2021-09-16 DIAGNOSIS — R002 Palpitations: Secondary | ICD-10-CM | POA: Insufficient documentation

## 2021-09-16 DIAGNOSIS — M542 Cervicalgia: Secondary | ICD-10-CM | POA: Insufficient documentation

## 2021-09-16 DIAGNOSIS — R0789 Other chest pain: Secondary | ICD-10-CM | POA: Insufficient documentation

## 2021-09-16 DIAGNOSIS — R000 Tachycardia, unspecified: Secondary | ICD-10-CM | POA: Insufficient documentation

## 2021-09-16 DIAGNOSIS — R202 Paresthesia of skin: Secondary | ICD-10-CM | POA: Insufficient documentation

## 2021-09-16 DIAGNOSIS — R079 Chest pain, unspecified: Secondary | ICD-10-CM

## 2021-09-16 DIAGNOSIS — M5412 Radiculopathy, cervical region: Secondary | ICD-10-CM

## 2021-09-16 LAB — BASIC METABOLIC PANEL
Anion gap: 9 (ref 5–15)
BUN: 15 mg/dL (ref 6–20)
CO2: 28 mmol/L (ref 22–32)
Calcium: 9.9 mg/dL (ref 8.9–10.3)
Chloride: 100 mmol/L (ref 98–111)
Creatinine, Ser: 1.07 mg/dL — ABNORMAL HIGH (ref 0.44–1.00)
GFR, Estimated: >60 mL/min (ref >60)
Glucose, Bld: 93 mg/dL (ref 70–99)
Potassium: 4.2 mmol/L (ref 3.5–5.1)
Sodium: 137 mmol/L (ref 135–145)

## 2021-09-16 LAB — COMPREHENSIVE METABOLIC PANEL
ALT: 15 U/L (ref 0–44)
AST: 15 U/L (ref 15–41)
Albumin: 4.2 g/dL (ref 3.5–5.0)
Alkaline Phosphatase: 49 U/L (ref 38–126)
Anion gap: 8 (ref 5–15)
BUN: 13 mg/dL (ref 6–20)
CO2: 26 mmol/L (ref 22–32)
Calcium: 9.5 mg/dL (ref 8.9–10.3)
Chloride: 103 mmol/L (ref 98–111)
Creatinine, Ser: 0.85 mg/dL (ref 0.44–1.00)
GFR, Estimated: >60 mL/min (ref >60)
Glucose, Bld: 98 mg/dL (ref 70–99)
Potassium: 4.3 mmol/L (ref 3.5–5.1)
Sodium: 137 mmol/L (ref 135–145)
Total Bilirubin: 0.6 mg/dL (ref 0.3–1.2)
Total Protein: 7.9 g/dL (ref 6.5–8.1)

## 2021-09-16 LAB — TROPONIN I (HIGH SENSITIVITY)
Troponin I (High Sensitivity): 3 ng/L (ref <18)
Troponin I (High Sensitivity): <2 ng/L (ref <18)

## 2021-09-16 LAB — D-DIMER, QUANTITATIVE: D-Dimer, Quant: <0.27 ug/mL-FEU (ref 0.00–0.50)

## 2021-09-16 LAB — RESP PANEL BY RT-PCR (FLU A&B, COVID) ARPGX2
Influenza A by PCR: NEGATIVE
Influenza B by PCR: NEGATIVE
SARS Coronavirus 2 by RT PCR: NEGATIVE

## 2021-09-16 LAB — PROCALCITONIN: Procalcitonin: <0.1 ng/mL

## 2021-09-16 LAB — MAGNESIUM: Magnesium: 2.2 mg/dL (ref 1.7–2.4)

## 2021-09-16 LAB — TSH: TSH: 2.583 u[IU]/mL (ref 0.350–4.500)

## 2021-09-16 MED ORDER — LACTATED RINGERS IV BOLUS
1000.0000 mL | Freq: Once | INTRAVENOUS | Status: AC
  Administered 2021-09-16: 1000 mL via INTRAVENOUS

## 2021-09-16 MED ORDER — PREDNISONE 20 MG PO TABS
60.0000 mg | ORAL_TABLET | Freq: Once | ORAL | Status: AC
  Administered 2021-09-16: 60 mg via ORAL
  Filled 2021-09-16: qty 3

## 2021-09-16 MED ORDER — KETOROLAC TROMETHAMINE 30 MG/ML IJ SOLN
60.0000 mg | Freq: Once | INTRAMUSCULAR | Status: AC
  Administered 2021-09-16: 60 mg via INTRAMUSCULAR
  Filled 2021-09-16: qty 2

## 2021-09-16 MED ORDER — KETOROLAC TROMETHAMINE 30 MG/ML IJ SOLN
15.0000 mg | Freq: Once | INTRAMUSCULAR | Status: AC
  Administered 2021-09-16: 15 mg via INTRAVENOUS
  Filled 2021-09-16: qty 1

## 2021-09-16 MED ORDER — PREDNISONE 10 MG PO TABS: 60.0000 mg | ORAL_TABLET | ORAL | 0 refills | Status: DC

## 2021-09-16 NOTE — ED Notes (Signed)
This RN attempted IV twice. Pt advised she has been here several times./ pt has multiple bruised IV and blood draw sites. Will put in for IV team consult.

## 2021-09-16 NOTE — ED Provider Notes (Signed)
----------------------------------------- °  6:05 PM on 09/16/2021 -----------------------------------------  Patient's work-up is reassuring.  D-dimer negative.  Troponin negative.  Procalcitonin negative.  Magnesium is normal.  Patient's chemistry is normal.  TSH is normal.  COVID and flu are negative.  Blood cultures have been sent.  Patient states she is feeling better she does not want the fluids that have been ordered for her.  Patient states she wishes to go home.  She is on Suboxone for chronic discomfort, patient states she will follow-up with her pain management specialist.   Minna Antis, MD 09/16/21 1806

## 2021-09-16 NOTE — ED Triage Notes (Signed)
Pt to ED via POV with c/o palpitations and chest pain that has been on going, She was just seen here for the same last night. Also states having difficulty using the bathroom

## 2021-09-16 NOTE — ED Provider Notes (Signed)
[facility] Provider Note    Event Date/Time   First MD Initiated Contact with Patient 09/16/21 1309     (approximate)   History   Palpitations   HPI  Kendra [name] is a 31 y.o. female with a past medical history of cervical radiculopathy, anxiety and Suboxone secondary to remote opioid abuse who presents for evaluation of several concerns primarily with left-sided chest pain.  Patient notes she was seen in the emergency room yesterday for this as well as some pain in her neck and extremity paresthesias was diagnosed with likely symptoms related to her cervical radiculopathy although is worried because she is having worsening chest pain since leaving.  She was able to take her ketorolac which was previously prescribed.  She also states that for last couple days she has had a mild cough.  No fevers history symptoms will feel short of breath with exertion.  She denies any new extremity weakness numbness or tingling or any new back pain or abdominal pain but states she has been little bit constipated and feels she is dehydrated as she has had little urine output.  She denies any recent drug use in the last couple years or any history of malignancy or steroid use.  No recent injuries or falls.  She states she is following with neurology and cardiology for palpitations and chest discomfort she has been having for several weeks as well as paresthesias symptoms in her neck.      Physical Exam  Triage Vital Signs: ED Triage Vitals [09/16/21 1305]  Enc Vitals Group     BP      Pulse      Resp      Temp      Temp src      SpO2      Weight 220 lb (99.8 kg)     Height 5\' 7"  (1.702 m)     Head Circumference      Peak Flow      Pain Score 8     Pain Loc      Pain Edu?      Excl. in [place]?     Most recent vital signs: Vitals:   09/16/21 1357  BP: 130/89  Pulse: (!) 103  Resp: 17  Temp: 98.3 F (36.8 C)  SpO2: 98%    General: Awake, appears mildly  uncomfortable. CV:  Good peripheral perfusion.  Slight tachycardic.  2+ radial pulses. Resp:  Normal effort.  Clear bilaterally Abd:  No distention.  Soft throughout Other:  Cranial nerves II through XII grossly intact.  No pronator drift.  Symmetric 5/5 strength of all extremities.  Sensation intact to light touch in all extremities.  Unremarkable unassisted gait.  2+ bilateral patellar reflexes.  No overlying skin changes or point tenderness over the entire spine.    ED Results / Procedures / Treatments  Labs (all labs ordered are listed, but only abnormal results are displayed) Labs Reviewed  CULTURE, BLOOD (ROUTINE X 2)  CULTURE, BLOOD (ROUTINE X 2)  RESP PANEL BY RT-PCR (FLU A&B, COVID) ARPGX2  D-DIMER, QUANTITATIVE  PROCALCITONIN  MAGNESIUM  COMPREHENSIVE METABOLIC PANEL  TSH  URINALYSIS, COMPLETE (UACMP) WITH MICROSCOPIC  TROPONIN I (HIGH SENSITIVITY)  TROPONIN I (HIGH SENSITIVITY)     EKG  EKG is markable sinus tachycardia with a ventricular rate of 110, incomplete right bundle branch block, nonspecific ST changes in inferior and lateral leads without other clear evidence of acute ischemia or significant arrhythmia  RADIOLOGY    PROCEDURES:  Critical Care performed: No  .1-3 Lead EKG Interpretation Performed by: Lucrezia Starch, MD Authorized by: Lucrezia Starch, MD     Interpretation: non-specific     ECG rate assessment: tachycardic     Rhythm: sinus tachycardia     Ectopy: none     Conduction: normal    The patient is on the cardiac monitor to evaluate for evidence of arrhythmia and/or significant heart rate changes.   MEDICATIONS ORDERED IN ED: Medications  lactated ringers bolus 1,000 mL (has no administration in time range)  ketorolac (TORADOL) 30 MG/ML injection 15 mg (has no administration in time range)     IMPRESSION / MDM / ASSESSMENT AND PLAN / ED COURSE  I reviewed the triage vital signs and the nursing notes.                               Differential diagnosis includes, but is not limited to, ACS, PE, endocarditis, pneumonia, infarct, pericarditis, myocarditis, arrhythmia, pneumonia, embolic arrangements possible acute viral process such as COVID.  I reviewed work-up from patient's visit yesterday evening I reviewed patient's work-up from her visit yesterday including chest x-ray that showed small thoracic osteophytes without any other acute thoracic process.  Patient also had a troponin obtained that was not elevated.  We will plan to obtain repeat troponin to rule out new ischemic event or myocarditis as well as a D-dimer to assess risk for PE and if elevated plan to obtain a CTA chest.  Given she is tachycardic now and had a white blood cell count of 12,000 last night somewhat difficult to exclude sepsis from pneumonia on arrival.  Will obtain blood cultures as well as basic screening labs check magnesium and a COVID and influenza PCR.  We will also order postvoid residual bladder scan to assess for any evidence of urinary retention.  Care of patient signed over to assuming care at approximately 1500.  Plan is to follow-up labs and reassess.  Will start on IV fluids and order some Toradol in the meantime.      FINAL CLINICAL IMPRESSION(S) / ED DIAGNOSES   Final diagnoses:  Chest pain, unspecified type     Rx / DC Orders   ED Discharge Orders     None        Note:  This document was prepared using Dragon voice recognition software and may include unintentional dictation errors.   Lucrezia Starch, MD 09/16/21 [phone]

## 2021-09-16 NOTE — ED Notes (Signed)
Rn to bedside to answer call bell. Pt complaining about her fluids taking too long. RN assessed line and line was clamped off at IV site by the patient. Pt states "I turned it off bc I am scared of the air in the line". RN restarted fluids with pump.

## 2021-09-16 NOTE — ED Notes (Signed)
RN to bedside to introduce self to patient and initiate patient care. Pt is sitting in bed watching TV .CAOx4 and in no acute distress.

## 2021-09-16 NOTE — ED Provider Notes (Signed)
[facility] Provider Note    Event Date/Time   First MD Initiated Contact with Patient 09/16/21 0151     (approximate)   History   No chief complaint on file.   HPI  Kendra [name] is a 31 y.o. female with history of previous opiate use on Suboxone, anxiety who presents to the emergency department with complaints of several weeks of neck and upper back pain that radiate into her chest.  Pain is worse with movement and palpation.  No fevers, cough, shortness of breath, nausea, vomiting.  States at times she will have numbness down her left arm but no numbness, tingling or weakness currently.  No bowel or bladder incontinence.  No urinary retention.  No history of previous back surgery or epidural injections.  She was seen in the emergency department on the 17th and was given prescriptions for Lidoderm patches, etodolac and Robaxin.  She states she has not started any of these medications.  On review of her records, it appears that she has been to the emergency department here and at [facility] multiple times in the past few months for various different symptoms.   History provided by patient.    Past Medical History:  Diagnosis Date   Anxiety    Depression    Headache    stress   History of cannabis abuse    Seizures (HCC)    age 59. syncopal episode. Hit head. had seizure.    Tobacco abuse     Past Surgical History:  Procedure Laterality Date   COLON SURGERY  2013   pt states "colonoscopy while awake"   COLONOSCOPY WITH PROPOFOL N/A 12/14/2015   Procedure: COLONOSCOPY WITH PROPOFOL;  Surgeon: Midge Minium, MD;  Location: [facility];  Service: Endoscopy;  Laterality: N/A;   ESOPHAGOGASTRODUODENOSCOPY (EGD) WITH PROPOFOL N/A 12/14/2015   Procedure: ESOPHAGOGASTRODUODENOSCOPY (EGD) WITH PROPOFOL;  Surgeon: Midge Minium, MD;  Location: [facility];  Service: Endoscopy;  Laterality: N/A;   WISDOM TOOTH EXTRACTION  2013    MEDICATIONS:   Prior to Admission medications   Medication Sig Start Date End Date Taking? Authorizing Provider  albuterol (VENTOLIN HFA) 108 (90 Base) MCG/ACT inhaler Inhale 2 puffs into the lungs every 6 (six) hours as needed for wheezing or shortness of breath. Patient not taking: Reported on 09/13/2021 10/26/20   [name], MD  ALPRAZolam Prudy Feeler) 0.5 MG tablet Take 1 tablet by mouth 3 (three) times daily as needed. 08/27/21   [provider]  buprenorphine-naloxone (SUBOXONE) 8-2 mg SUBL SL tablet Place 1 tablet under the tongue daily. Patient uses  film bid    [provider]  dicyclomine (BENTYL) 10 MG capsule Take 1 capsule (10 mg total) by mouth 4 (four) times daily. 12/04/15 12/18/15  Loleta Rose, MD  escitalopram (LEXAPRO) 5 MG tablet Take 1 tablet by mouth daily. 07/24/21   [provider]  etodolac (LODINE) 400 MG tablet Take 1 tablet (400 mg total) by mouth 2 (two) times daily. 09/14/21   [name], PA-C  lidocaine (LIDODERM) 5 % Place 1 patch onto the skin every 12 (twelve) hours. Remove & Discard patch within 12 hours or as directed by MD 09/14/21 09/14/22  [name], PA-C  methocarbamol (ROBAXIN) 500 MG tablet Take 1 tablet (500 mg total) by mouth every 6 (six) hours as needed. 09/14/21   [name], PA-C  norethindrone (MICRONOR) 0.35 MG tablet Take 1 tablet by mouth daily. 09/10/21   [provider]  pantoprazole (PROTONIX) 20 MG tablet Take 20 mg by mouth 2 (two) times daily. 09/02/21   [provider]  polyethylene glycol (MIRALAX) 17 g packet Take 17 g by mouth daily. 09/08/21   [name], MD  propranolol (INDERAL) 40 MG tablet Take 40 mg by mouth 2 (two) times daily. 08/20/21   [provider]  sertraline (ZOLOFT) 50 MG tablet Take 50 mg by mouth daily. 09/13/21   [provider]    Physical Exam   Triage Vital Signs: ED Triage Vitals  Enc Vitals Group     BP 09/15/21 2258 135/86     Pulse  Rate 09/15/21 2258 99     Resp 09/15/21 2258 20     Temp 09/15/21 2258 98.4 F (36.9 C)     Temp Source 09/15/21 2258 Oral     SpO2 09/15/21 2258 100 %     Weight --      Height --      Head Circumference --      Peak Flow --      Pain Score 09/15/21 2259 8     Pain Loc --      Pain Edu? --      Excl. in GC? --     Most recent vital signs: Vitals:   09/15/21 2258 09/16/21 0337  BP: 135/86 128/64  Pulse: 99 98  Resp: 20 16  Temp: 98.4 F (36.9 C) 98.1 F (36.7 C)  SpO2: 100% 99%    CONSTITUTIONAL: Alert and oriented and responds appropriately to questions. Well-appearing; well-nourished HEAD: Normocephalic, atraumatic EYES: Conjunctivae clear, pupils appear equal, sclera nonicteric ENT: normal nose; moist mucous membranes NECK: Supple, normal ROM, no midline spinal tenderness or step-off or deformity CARD: RRR; S1 and S2 appreciated; no murmurs, no clicks, no rubs, no gallops RESP: Normal chest excursion without splinting or tachypnea; breath sounds clear and equal bilaterally; no wheezes, no rhonchi, no rales, no hypoxia or respiratory distress, speaking full sentences ABD/GI: Normal bowel sounds; non-distended; soft, non-tender, no rebound, no guarding, no peritoneal signs BACK: The back appears normal EXT: Normal ROM in all joints; no deformity noted, no edema; no cyanosis SKIN: Normal color for age and race; warm; no rash on exposed skin NEURO: Moves all extremities equally, normal speech, strength 5/5 in all 4 extremities, cranial nerves II through XII intact, 2+ deep tendon reflexes in bilateral upper and lower extremities, normal sensation diffusely, no saddle anesthesia, normal gait PSYCH: The patient's mood and manner are appropriate.   ED Results / Procedures / Treatments   LABS: (all labs ordered are listed, but only abnormal results are displayed) Labs Reviewed  BASIC METABOLIC PANEL - Abnormal; Notable for the following components:      Result Value    Creatinine, Ser 1.07 (*)    All other components within normal limits  CBC - Abnormal; Notable for the following components:   WBC 12.0 (*)    All other components within normal limits  TROPONIN I (HIGH SENSITIVITY)     EKG:   EKG Interpretation  Date/Time:  Saturday September 15 2021 23:04:33 EST Ventricular Rate:  93 PR Interval:  144 QRS Duration: 94 QT Interval:  346 QTC Calculation: 430 R Axis:   81 Text Interpretation: Normal sinus rhythm with sinus arrhythmia Incomplete right bundle branch block Borderline ECG When compared with ECG of 14-Sep-2021 10:19, Nonspecific T wave abnormality no longer evident in Anterior leads Confirmed by Rochele Raring [phone]) on 09/16/2021 6:43:14  AM        RADIOLOGY: My personal review and interpretation of imaging: Chest x-ray clear.  I have personally reviewed all radiology reports.   DG Chest 2 View  Result Date: 09/15/2021 CLINICAL DATA:  Upper chest and back pain for the last several days. EXAM: CHEST - 2 VIEW COMPARISON:  09/06/2021 FINDINGS: Heart and mediastinal shadows are normal. The lungs are clear. No effusions. Mild thoracic osteophytes as seen previously. IMPRESSION: No active cardiopulmonary disease. Small thoracic osteophytes as seen previously. Electronically Signed   By: Paulina Fusi M.D.   On: 09/15/2021 23:33     PROCEDURES:  Critical Care performed: No   CRITICAL CARE Performed by: Baxter Hire Rennae Ferraiolo   Total critical care time: 0 minutes  Critical care time was exclusive of separately billable procedures and treating other patients.  Critical care was necessary to treat or prevent imminent or life-threatening deterioration.  Critical care was time spent personally by me on the following activities: development of treatment plan with patient and/or surrogate as well as nursing, discussions with consultants, evaluation of patient's response to treatment, examination of patient, obtaining history from patient or  surrogate, ordering and performing treatments and interventions, ordering and review of laboratory studies, ordering and review of radiographic studies, pulse oximetry and re-evaluation of patient's condition.   Procedures    IMPRESSION / MDM / ASSESSMENT AND PLAN / ED COURSE  I reviewed the triage vital signs and the nursing notes.    Patient here with neck pain, upper back pain, chest pain that have been ongoing for weeks.    DIFFERENTIAL DIAGNOSIS (includes but not limited to):   Musculoskeletal pain, radiculopathy, muscle strain, costochondritis.  Very low suspicion for ACS, PE, dissection, pneumothorax, pneumonia.   PLAN: We will obtain CBC, BMP, troponin, chest x-ray, EKG.  Will give Toradol and prednisone.   MEDICATIONS GIVEN IN ED: Medications  predniSONE (DELTASONE) tablet 60 mg (60 mg Oral Given 09/16/21 0245)  ketorolac (TORADOL) 30 MG/ML injection 60 mg (60 mg Intramuscular Given 09/16/21 0243)     ED COURSE: Patient's labs are reassuring.  Normal hemoglobin, electrolytes, renal function and negative troponin.  Given symptoms ongoing for weeks, I do not feel she needs serial troponins.  EKG shows incomplete right bundle branch block which is old compared to previous with no new ischemic change.  I have reviewed her chest x-ray images and shows no infiltrate, edema or pneumothorax.  Discussed with patient that at this time I recommend follow-up with an outpatient doctor for further management for her symptoms especially given she has been to the ER multiple times to both [facility] and [facility] with various symptoms with negative work-ups.  I have low suspicion for any life-threatening illness today.  We discussed that this may be cervical radiculopathy given she has pain that at times will go down her arm with some numbness but is neurologically intact here and has no red flag symptoms to suggest cauda equina, epidural abscess or hematoma, severe or critical spinal stenosis, transverse  myelitis, discitis or osteomyelitis.  We will place her on a steroid taper.  She has a PCP for follow-up.  Recommended that she pick up the etodolac that was prescribed to her 2 days ago.  She verbalized understanding.  I suspect that anxiety is playing a significant role in her symptoms today.  At this time, I do not feel there is any life-threatening condition present. I reviewed all nursing notes, vitals, pertinent previous records.  All lab and urine  results, EKGs, imaging ordered have been independently reviewed and interpreted by myself.  I reviewed all available radiology reports from any imaging ordered this visit.  Based on my assessment, I feel the patient is safe to be discharged home without further emergent workup and can continue workup as an outpatient as needed. Discussed all findings, treatment plan as well as usual and customary return precautions with patient.  They verbalize understanding and are comfortable with this plan.  Outpatient follow-up has been provided as needed.  All questions have been answered.    CONSULTS: No admission needed at this time given chest pain atypical for ACS and she has no focal neurologic deficits or concerns for neurosurgical emergency.   OUTSIDE RECORDS REVIEWED: Reviewed patient's neurology note from 08/30/2021.  Neurology is ordering a brain and cervical spine MRI as an outpatient.         FINAL CLINICAL IMPRESSION(S) / ED DIAGNOSES   Final diagnoses:  Cervical radiculopathy     Rx / DC Orders   ED Discharge Orders          Ordered    predniSONE (DELTASONE) 10 MG tablet  As directed        09/16/21 0246             Note:  This document was prepared using Dragon voice recognition software and may include unintentional dictation errors.   Sebert Stollings, [name], DO 09/16/21 [phone]

## 2021-09-16 NOTE — ED Notes (Signed)
Rn to bedside to answer call bell again. Pt keeps clamping off her IV fluids. She feels the small air bubbles in the line will harm her. She is now stating "I dont want the fluids". I told her I would inform the MD.

## 2021-09-19 ENCOUNTER — Emergency Department: Payer: Medicaid Other

## 2021-09-19 ENCOUNTER — Emergency Department
Admission: EM | Admit: 2021-09-19 | Discharge: 2021-09-19 | Disposition: A | Discharge status: Home / Self Care | Payer: Medicaid Other | Attending: Emergency Medicine | Admitting: Emergency Medicine

## 2021-09-19 ENCOUNTER — Other Ambulatory Visit: Payer: Self-pay

## 2021-09-19 DIAGNOSIS — I10 Essential (primary) hypertension: Secondary | ICD-10-CM | POA: Diagnosis not present

## 2021-09-19 DIAGNOSIS — F17200 Nicotine dependence, unspecified, uncomplicated: Secondary | ICD-10-CM | POA: Insufficient documentation

## 2021-09-19 DIAGNOSIS — R200 Anesthesia of skin: Secondary | ICD-10-CM | POA: Diagnosis not present

## 2021-09-19 DIAGNOSIS — R202 Paresthesia of skin: Secondary | ICD-10-CM | POA: Diagnosis not present

## 2021-09-19 DIAGNOSIS — R531 Weakness: Secondary | ICD-10-CM | POA: Diagnosis not present

## 2021-09-19 LAB — COMPREHENSIVE METABOLIC PANEL
ALT: 15 U/L (ref 0–44)
AST: 17 U/L (ref 15–41)
Albumin: 4.9 g/dL (ref 3.5–5.0)
Alkaline Phosphatase: 54 U/L (ref 38–126)
Anion gap: 10 (ref 5–15)
BUN: 10 mg/dL (ref 6–20)
CO2: 24 mmol/L (ref 22–32)
Calcium: 9.7 mg/dL (ref 8.9–10.3)
Chloride: 102 mmol/L (ref 98–111)
Creatinine, Ser: 0.87 mg/dL (ref 0.44–1.00)
GFR, Estimated: >60 mL/min (ref >60)
Glucose, Bld: 88 mg/dL (ref 70–99)
Potassium: 3.9 mmol/L (ref 3.5–5.1)
Sodium: 136 mmol/L (ref 135–145)
Total Bilirubin: 0.6 mg/dL (ref 0.3–1.2)
Total Protein: 8.7 g/dL — ABNORMAL HIGH (ref 6.5–8.1)

## 2021-09-19 LAB — CBC WITH DIFFERENTIAL/PLATELET
Abs Immature Granulocytes: 0.02 10*3/uL (ref 0.00–0.07)
Basophils Absolute: 0 10*3/uL (ref 0.0–0.1)
Basophils Relative: 0 %
Eosinophils Absolute: 0.1 10*3/uL (ref 0.0–0.5)
Eosinophils Relative: 1 %
HCT: 43.4 % (ref 36.0–46.0)
Hemoglobin: 14.3 g/dL (ref 12.0–15.0)
Immature Granulocytes: 0 %
Lymphocytes Relative: 33 %
Lymphs Abs: 3.2 10*3/uL (ref 0.7–4.0)
MCH: 32.9 pg (ref 26.0–34.0)
MCHC: 32.9 g/dL (ref 30.0–36.0)
MCV: 100 fL (ref 80.0–100.0)
Monocytes Absolute: 0.7 10*3/uL (ref 0.1–1.0)
Monocytes Relative: 7 %
Neutro Abs: 5.6 10*3/uL (ref 1.7–7.7)
Neutrophils Relative %: 59 %
Platelets: 312 10*3/uL (ref 150–400)
RBC: 4.34 MIL/uL (ref 3.87–5.11)
RDW: 11.9 % (ref 11.5–15.5)
WBC: 9.6 10*3/uL (ref 4.0–10.5)
nRBC: 0 % (ref 0.0–0.2)

## 2021-09-19 NOTE — ED Triage Notes (Signed)
First Nurse Note:  C/O right side face and arm numbness and back of neck pain.  Patient states symptoms have been ongoing for several months.  Has followed up with PCP and neurology.    Patient is AAOx3.  Skin warm and dry.  Speech clear. MAE equally and strong.  Facial movements equal. NAD

## 2021-09-19 NOTE — Discharge Instructions (Signed)
Follow-up with your primary care doctor and your neurologist as scheduled.  As discussed, you should take the gabapentin and other medications as prescribed by your doctors.  Return to the ER for new, worsening, or persistent severe numbness, weakness, vision change, severe headache, vomiting, chest pain, or any other new or worsening symptoms that concern you.

## 2021-09-19 NOTE — ED Provider Notes (Signed)
[facility] Provider Note    Event Date/Time   First MD Initiated Contact with Patient 09/19/21 1955     (approximate)   History   Weakness   HPI  Kendra [name] is a 31 y.o. female with a history of cervical radiculopathy, anxiety, and Suboxone use (with a remote history of opiate abuse) who presents with worsened right arm tingling and numbness over the last 2 days associated with some tingling and numbness in the right side of her face.  The patient has had the symptoms for months but they have worsened in the last 2 days ever since she had a syncopal episode for which she was evaluated in outside ER.  The symptoms are somewhat intermittent.  She denies any significant associated weakness.  She has no vision changes, speech disturbance, weakness or numbness in the legs, or other acute neurologic symptoms.  She denies fever or neck pain.  She has no active chest pain or difficulty breathing.  The patient has followed up with her primary care doctor and has an appointment with neurology early next month.  She has had multiple studies for the symptoms over the last few months and is scheduled for a cervical spine MRI next week.  She has been prescribed gabapentin and methocarbamol but has been somewhat hesitant to take them as she is worried about possible interactions with Suboxone.   Physical Exam   Triage Vital Signs: ED Triage Vitals  Enc Vitals Group     BP 09/19/21 1726 (!) 169/112     Pulse Rate 09/19/21 1726 95     Resp 09/19/21 1726 18     Temp 09/19/21 1726 98.4 F (36.9 C)     Temp Source 09/19/21 1726 Oral     SpO2 09/19/21 1726 100 %     Weight 09/19/21 1728 220 lb (99.8 kg)     Height 09/19/21 1728 5\' 7"  (1.702 m)     Head Circumference --      Peak Flow --      Pain Score --      Pain Loc --      Pain Edu? --      Excl. in GC? --     Most recent vital signs: Vitals:   09/19/21 1726  BP: (!) 169/112  Pulse: 95  Resp: 18   Temp: 98.4 F (36.9 C)  SpO2: 100%     General: Awake, no distress.  CV:  Good peripheral perfusion.  Resp:  Normal effort.  Abd:  No distention.  Other:  EOMI.  PERRLA.  Cranial nerves III through XII intact.  Motor and sensory intact in all extremities except for very mildly decreased grip strength on the right.  No pronator drift.  No ataxia on finger-to-nose.   ED Results / Procedures / Treatments   Labs (all labs ordered are listed, but only abnormal results are displayed) Labs Reviewed  COMPREHENSIVE METABOLIC PANEL - Abnormal; Notable for the following components:      Result Value   Total Protein 8.7 (*)    All other components within normal limits  CBC WITH DIFFERENTIAL/PLATELET     EKG     RADIOLOGY  MR cervical spine: I independently viewed and interpreted the images.  IMPRESSION:  1. No spinal canal stenosis or neural foraminal narrowing.  2. Diffusely decreased marrow signal, which is nonspecific; although  this can be caused by an infiltrative marrow process, the most  common causes include anemia, obesity,  or tobacco use.    PROCEDURES:  Critical Care performed: No  Procedures   MEDICATIONS ORDERED IN ED: Medications - No data to display   IMPRESSION / MDM / ASSESSMENT AND PLAN / ED COURSE  I reviewed the triage vital signs and the nursing notes.  31 year old female with PMH as noted above presents with worsened right arm and facial paresthesias over the last few days, although she has had the symptoms for a few months.  I reviewed the past medical records.  The patient was seen at the [facility] ED on 2/17 for acute on chronic neck and chest pain with a negative work-up and was started on gabapentin.  She was seen there on 2/20 with syncope and negative work-up.  She was seen in the ED on 2/19 here with chest pain on 2/14 with shoulder and chest pain.  CT head at [facility] 2 days ago was negative.  MR of the brain on 2/14 showed abnormal T1 marrow signal  but no intracranial findings.  CT angio of the chest on 2/9 was negative.  The patient has not had any cervical spine imaging.  On exam the patient is well-appearing.  Her vital signs are normal except for mild hypertension.  Neurologic exam is normal except that she has some very subtly decreased grip strength to the right hand, but RUE strength is otherwise intact.  She describes subjective decreased sensation to the forearm, hand, and all fingers, not following a dermatomal pattern.  Differential diagnosis includes, but is not limited to, cervical radiculopathy, peripheral neuropathy, other subacute neurologic etiology, electrolyte abnormality, anxiety.  Because of the worsened symptoms in the last few days and the slightly asymmetric grip strength I discussed with the patient whether to just obtain the MRI of the cervical spine today instead of waiting until next week and she agrees.  Basic labs have also been ordered.  Given her extensive work-up in the last several weeks, there is no indication for further ED work-up.  ----------------------------------------- 11:40 PM on 09/19/2021 -----------------------------------------  BMP and CBC are normal.  MRI of the cervical spine shows no spine impingement or other acute abnormalities except for abnormal T1 marrow signal as on the MRI over the brain several days ago.  The patient is not anemic but is a smoker, so this is the most likely culprit.  At this time, the patient is stable for discharge home.  There is no evidence of concerning acute CNS cause.  I recommended that she take the gabapentin and methocarbamol that she has been prescribed as there should be no interaction with Suboxone, and she agrees with this plan.  She will follow-up with her PMD and neurology as scheduled.  I gave her very thorough return precautions and she expressed understanding.   FINAL CLINICAL IMPRESSION(S) / ED DIAGNOSES   Final diagnoses:  Arm numbness     Rx  / DC Orders   ED Discharge Orders     None        Note:  This document was prepared using Dragon voice recognition software and may include unintentional dictation errors.    Dionne Bucy, MD 09/19/21 [phone]

## 2021-09-19 NOTE — ED Triage Notes (Addendum)
See first nurse note- Patient with intermittent numbness present to right side of face and arm. Reports symptoms have been ongoing for over 8 weeks. Patient recently seen at [facility] and here for the same. States she has a negative workup so far including MRI but has no answers.

## 2021-09-21 LAB — CULTURE, BLOOD (ROUTINE X 2): Culture: NO GROWTH

## 2021-09-24 ENCOUNTER — Inpatient Hospital Stay: Admission: RE | Admit: 2021-09-24 | Payer: Medicaid Other | Source: Ambulatory Visit

## 2021-10-03 ENCOUNTER — Emergency Department
Admission: EM | Admit: 2021-10-03 | Discharge: 2021-10-03 | Disposition: A | Discharge status: Home / Self Care | Payer: Medicaid Other | Attending: Emergency Medicine | Admitting: Emergency Medicine

## 2021-10-03 ENCOUNTER — Encounter: Payer: Self-pay | Admitting: Emergency Medicine

## 2021-10-03 ENCOUNTER — Other Ambulatory Visit: Payer: Self-pay

## 2021-10-03 DIAGNOSIS — R21 Rash and other nonspecific skin eruption: Secondary | ICD-10-CM | POA: Insufficient documentation

## 2021-10-03 DIAGNOSIS — R52 Pain, unspecified: Secondary | ICD-10-CM

## 2021-10-03 DIAGNOSIS — M791 Myalgia, unspecified site: Secondary | ICD-10-CM | POA: Insufficient documentation

## 2021-10-03 DIAGNOSIS — R531 Weakness: Secondary | ICD-10-CM | POA: Insufficient documentation

## 2021-10-03 LAB — URINALYSIS, ROUTINE W REFLEX MICROSCOPIC
Bilirubin Urine: NEGATIVE
Glucose, UA: NEGATIVE mg/dL
Hgb urine dipstick: NEGATIVE
Ketones, ur: NEGATIVE mg/dL
Leukocytes,Ua: NEGATIVE
Nitrite: NEGATIVE
Protein, ur: NEGATIVE mg/dL
Specific Gravity, Urine: 1.024 (ref 1.005–1.030)
pH: 5 (ref 5.0–8.0)

## 2021-10-03 LAB — WET PREP, GENITAL
Clue Cells Wet Prep HPF POC: NONE SEEN
Sperm: NONE SEEN
Trich, Wet Prep: NONE SEEN
WBC, Wet Prep HPF POC: <10 (ref <10)
Yeast Wet Prep HPF POC: NONE SEEN

## 2021-10-03 LAB — BASIC METABOLIC PANEL
Anion gap: 6 (ref 5–15)
BUN: 9 mg/dL (ref 6–20)
CO2: 27 mmol/L (ref 22–32)
Calcium: 9.4 mg/dL (ref 8.9–10.3)
Chloride: 103 mmol/L (ref 98–111)
Creatinine, Ser: 0.83 mg/dL (ref 0.44–1.00)
GFR, Estimated: >60 mL/min (ref >60)
Glucose, Bld: 101 mg/dL — ABNORMAL HIGH (ref 70–99)
Potassium: 4 mmol/L (ref 3.5–5.1)
Sodium: 136 mmol/L (ref 135–145)

## 2021-10-03 LAB — CBC
HCT: 42.3 % (ref 36.0–46.0)
Hemoglobin: 14 g/dL (ref 12.0–15.0)
MCH: 33.2 pg (ref 26.0–34.0)
MCHC: 33.1 g/dL (ref 30.0–36.0)
MCV: 100.2 fL — ABNORMAL HIGH (ref 80.0–100.0)
Platelets: 297 10*3/uL (ref 150–400)
RBC: 4.22 MIL/uL (ref 3.87–5.11)
RDW: 12.5 % (ref 11.5–15.5)
WBC: 8.6 10*3/uL (ref 4.0–10.5)
nRBC: 0 % (ref 0.0–0.2)

## 2021-10-03 LAB — CHLAMYDIA/NGC RT PCR (ARMC ONLY)
Chlamydia Tr: NOT DETECTED
N gonorrhoeae: NOT DETECTED

## 2021-10-03 LAB — POC URINE PREG, ED: Preg Test, Ur: NEGATIVE

## 2021-10-03 MED ORDER — PERMETHRIN 5 % EX CREA: 1.0000 "application " | TOPICAL_CREAM | Freq: Once | CUTANEOUS | 0 refills | Status: AC

## 2021-10-03 NOTE — ED Provider Notes (Signed)
? ?[facility] ?Provider Note ? ? ? Event Date/Time  ? First MD Initiated Contact with Patient 10/03/21 [phone]   ?  (approximate) ? ?History  ? ?Chief Complaint: Generalized Body Aches and Loss of Consciousness ? ?HPI ? ?Kendra [name] is a 31 y.o. female with a past medical history of anxiety who presents to the emergency department for multiple complaints.  According to the patient she has been experiencing pains throughout her body mostly in her lower extremities recently.  This has been an ongoing issue for several months where the patient feels weak at times feels like she is going to pass out at times.  Patient has been to the emergency department multiple times for the same.  States she has been seen her primary care doctor multiple times for the same.  Patient has seen cardiology and neurology, has a referral to rheumatology for the same symptoms.  Patient states when she does not have an appointment and she has symptoms she does not know where else to go so she comes to the emergency department for evaluation.  Here the patient is anxious appearing.  As a secondary complaint she states since Monday she has had a genital rash.  Denies any new sexual partners or discharge. ? ?Physical Exam  ? ?Triage Vital Signs: ?ED Triage Vitals  ?Enc Vitals Group  ?   BP 10/03/21 0838 (!) 121/93  ?   Pulse Rate 10/03/21 0838 (!) 55  ?   Resp 10/03/21 0838 17  ?   Temp 10/03/21 0838 98.4 ?F (36.9 ?C)  ?   Temp Source 10/03/21 0838 Oral  ?   SpO2 10/03/21 0838 94 %  ?   Weight --   ?   Height --   ?   Head Circumference --   ?   Peak Flow --   ?   Pain Score 10/03/21 0837 8  ?   Pain Loc --   ?   Pain Edu? --   ?   Excl. in GC? --   ? ? ?Most recent vital signs: ?Vitals:  ? 10/03/21 0838  ?BP: (!) 121/93  ?Pulse: (!) 55  ?Resp: 17  ?Temp: 98.4 ?F (36.9 ?C)  ?SpO2: 94%  ? ? ?General: Awake, no distress.  ?CV:  Good peripheral perfusion.  Regular rate and rhythm  ?Resp:  Normal effort.  Equal breath  sounds bilaterally.  ?Abd:  No distention.  Soft, nontender.  No rebound or guarding. ?Other:  Somewhat anxious appearing. ? ? ?ED Results / Procedures / Treatments  ? ?EKG ? ?EKG viewed and interpreted by myself shows a normal sinus rhythm at 93 bpm with a narrow QRS, normal axis, normal intervals, no concerning ST changes. ? ? ?MEDICATIONS ORDERED IN ED: ?Medications - No data to display ? ? ?IMPRESSION / MDM / ASSESSMENT AND PLAN / ED COURSE  ?I reviewed the triage vital signs and the nursing notes. ? ?Patient presents to the emergency department for symptoms of generalized pain and weakness.  States symptoms have been ongoing and intermittent for months has seen multiple specialist as well as her PCP multiple times and has had 11 ER visits over the past 6 months for similar symptoms.  Patient states she does not know where else to go so she comes to the emergency department.  Currently the patient appears well does appear somewhat anxious appearing but overall well-appearing.  Nontoxic and in no distress.  We will check basic labs.  Given  the patient's complaint of a genital rash I will perform an exam with a nurse chaperone. ? ?I reviewed the patient's recent cardiology note from 09/20/2021 patient had a 7-day Holter monitor with no significant findings. ? ?Lab work shows a normal CBC.  Normal urinalysis with negative pregnancy test.  Normal chemistry as well. ? ?Patient's wet prep is negative.  On examination patient does have several red bumps to the mons pubis area as well as left labia majora over areas that appear consistent with follicles.  Differential would include folliculitis versus louse.  We will treat with permethrin, described application avoiding mucous membranes and washing off after 8 hours.  Patient agreeable to plan.  We will follow-up with her doctor.  For the patient's weakness/body pains standpoint patient continues to appear well and is following up with multiple specialist regarding this.   No physical exam or lab findings concerning for acute abnormality. ? ? ? ?FINAL CLINICAL IMPRESSION(S) / ED DIAGNOSES  ? ?Body aches and pains ?Weakness ?Rash ? ?Rx / DC Orders  ? ?Permethrin ? ?Note:  This document was prepared using Dragon voice recognition software and may include unintentional dictation errors. ?  Minna Antis, MD ?10/03/21 [phone] ? ?

## 2021-10-03 NOTE — ED Notes (Addendum)
See triage note, pt c/o syncopal episodes for the past week as well as multiple health issues for the past several months. Also reports rash to vaginal area and joint pain ?Pt alert and oriented. Ambulatory, steady gait. NAD noted.  ?

## 2021-10-03 NOTE — Discharge Instructions (Signed)
As we discussed please apply permethrin cream to the affected area, covering generously.  Please leave for 8 hours and then shower off.  Please follow-up with your doctor.  Return to the emergency department for any symptom personally concerning to yourself. ?

## 2021-10-03 NOTE — ED Triage Notes (Signed)
Pt comes into the ED via POV c/o multiple syncopal episodes and severe body aches.  Pt has been seen here multiple times over the past couple months with similar problems that have so far been unable to be diagnosed.  PT in NAD at this time with even and unlabored respirations.  ?

## 2021-10-03 NOTE — ED Notes (Signed)
Sig pad not working, pt verbalizes understanding of d/c instructions. Denies questions or concerns. Ambulatory, steady gait ?

## 2021-10-09 ENCOUNTER — Encounter: Payer: Self-pay | Admitting: Emergency Medicine

## 2021-10-09 ENCOUNTER — Emergency Department
Admission: EM | Admit: 2021-10-09 | Discharge: 2021-10-10 | Disposition: A | Discharge status: Home / Self Care | Payer: Medicaid Other | Attending: Emergency Medicine | Admitting: Emergency Medicine

## 2021-10-09 ENCOUNTER — Emergency Department: Payer: Medicaid Other

## 2021-10-09 ENCOUNTER — Other Ambulatory Visit: Payer: Self-pay

## 2021-10-09 DIAGNOSIS — M25512 Pain in left shoulder: Secondary | ICD-10-CM | POA: Diagnosis not present

## 2021-10-09 DIAGNOSIS — M546 Pain in thoracic spine: Secondary | ICD-10-CM | POA: Diagnosis not present

## 2021-10-09 DIAGNOSIS — R0602 Shortness of breath: Secondary | ICD-10-CM | POA: Insufficient documentation

## 2021-10-09 DIAGNOSIS — R110 Nausea: Secondary | ICD-10-CM | POA: Diagnosis not present

## 2021-10-09 DIAGNOSIS — R0789 Other chest pain: Secondary | ICD-10-CM | POA: Insufficient documentation

## 2021-10-09 DIAGNOSIS — M542 Cervicalgia: Secondary | ICD-10-CM | POA: Insufficient documentation

## 2021-10-09 DIAGNOSIS — R42 Dizziness and giddiness: Secondary | ICD-10-CM | POA: Diagnosis not present

## 2021-10-09 DIAGNOSIS — R079 Chest pain, unspecified: Secondary | ICD-10-CM | POA: Diagnosis present

## 2021-10-09 DIAGNOSIS — R55 Syncope and collapse: Secondary | ICD-10-CM | POA: Insufficient documentation

## 2021-10-09 LAB — CBC
HCT: 42.9 % (ref 36.0–46.0)
Hemoglobin: 14.4 g/dL (ref 12.0–15.0)
MCH: 33.6 pg (ref 26.0–34.0)
MCHC: 33.6 g/dL (ref 30.0–36.0)
MCV: 100 fL (ref 80.0–100.0)
Platelets: 308 10*3/uL (ref 150–400)
RBC: 4.29 MIL/uL (ref 3.87–5.11)
RDW: 12.5 % (ref 11.5–15.5)
WBC: 11.5 10*3/uL — ABNORMAL HIGH (ref 4.0–10.5)
nRBC: 0 % (ref 0.0–0.2)

## 2021-10-09 LAB — BASIC METABOLIC PANEL
Anion gap: 4 — ABNORMAL LOW (ref 5–15)
BUN: 7 mg/dL (ref 6–20)
CO2: 30 mmol/L (ref 22–32)
Calcium: 9.3 mg/dL (ref 8.9–10.3)
Chloride: 101 mmol/L (ref 98–111)
Creatinine, Ser: 0.7 mg/dL (ref 0.44–1.00)
GFR, Estimated: >60 mL/min (ref >60)
Glucose, Bld: 94 mg/dL (ref 70–99)
Potassium: 3.6 mmol/L (ref 3.5–5.1)
Sodium: 135 mmol/L (ref 135–145)

## 2021-10-09 LAB — TROPONIN I (HIGH SENSITIVITY): Troponin I (High Sensitivity): 3 ng/L (ref <18)

## 2021-10-09 MED ORDER — KETOROLAC TROMETHAMINE 60 MG/2ML IM SOLN
30.0000 mg | Freq: Once | INTRAMUSCULAR | Status: AC
  Administered 2021-10-09: 30 mg via INTRAMUSCULAR

## 2021-10-09 MED ORDER — KETOROLAC TROMETHAMINE 30 MG/ML IJ SOLN
15.0000 mg | Freq: Once | INTRAMUSCULAR | Status: DC
  Filled 2021-10-09: qty 1

## 2021-10-09 NOTE — ED Provider Notes (Signed)
? ?[facility] ?Provider Note ? ? ? Event Date/Time  ? First MD Initiated Contact with Patient 10/09/21 2254   ?  (approximate) ? ? ?History  ? ?Chest Pain ? ? ?HPI ? ?Kendra [name] is a 31 y.o. female with a history of anxiety and depression who presents for evaluation of chest pain.  Patient reports that she has had this chest pain on a daily basis since December 2022.  She has been seen several times in the emergency room, has been seen by cardiology, primary care, and currently being worked up by neurology for the symptoms.  She reports that she has a chest pain daily and sometimes several times a day.  She can have the pain as soon as she wakes up, the pain can wake her up in the middle of the night, or the pain can develop during the day.  The pain can last minutes to hours.  The pain is always the same she describes as a stabbing pain in the center of her chest, upper back, base of her neck, and left shoulder.  She has been taking over-the-counter medications for this with no significant relief.  She reports that she comes to the emergency room when the pain becomes stronger than normal just to make sure everything else is okay.  The pain today has been stronger than normal.  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  She reports that the pain sometimes associated with nausea, lightheadedness, sometimes syncope, sometimes shortness of breath.  She did not pass out today and she denies any shortness of breath.  No personal or family history of PE or DVT, no recent travel or immobilization, no leg pain or swelling, no hemoptysis or exogenous hormones. ?  ? ? ?Past Medical History:  ?Diagnosis Date  ? Anxiety   ? Depression   ? Headache   ? stress  ? History of cannabis abuse   ? Seizures (HCC)   ? age 31. syncopal episode. Hit head. had seizure.   ? Tobacco abuse   ? ? ?Past Surgical History:  ?Procedure Laterality Date  ? COLON  SURGERY  2013  ? pt states "colonoscopy while awake"  ? COLONOSCOPY WITH PROPOFOL N/A 12/14/2015  ? Procedure: COLONOSCOPY WITH PROPOFOL;  Surgeon: Midge Minium, MD;  Location: [facility];  Service: Endoscopy;  Laterality: N/A;  ? ESOPHAGOGASTRODUODENOSCOPY (EGD) WITH PROPOFOL N/A 12/14/2015  ? Procedure: ESOPHAGOGASTRODUODENOSCOPY (EGD) WITH PROPOFOL;  Surgeon: Midge Minium, MD;  Location: [facility];  Service: Endoscopy;  Laterality: N/A;  ? WISDOM TOOTH EXTRACTION  2013  ? ? ? ?Physical Exam  ? ?Triage Vital Signs: ?ED Triage Vitals  ?Enc Vitals Group  ?   BP 10/09/21 2141 (!) 147/90  ?   Pulse Rate 10/09/21 2141 (!) 106  ?   Resp 10/09/21 2141 18  ?   Temp 10/09/21 2141 98.3 ?F (36.8 ?C)  ?   Temp Source 10/09/21 2141 Oral  ?   SpO2 10/09/21 2141 100 %  ?   Weight 10/09/21 2143 220 lb (99.8 kg)  ?   Height 10/09/21 2143 5\' 7"  (1.702 m)  ?   Head Circumference --   ?   Peak Flow --   ?   Pain Score 10/09/21 2143 9  ?   Pain Loc --   ?   Pain Edu? --   ?   Excl. in GC? --   ? ? ?  Most recent vital signs: ?Vitals:  ? 10/09/21 2239 10/10/21 0017  ?BP: 128/85 126/85  ?Pulse: 96 85  ?Resp: 19 17  ?Temp:    ?SpO2: 100% 100%  ? ? ? ?Constitutional: Alert and oriented. Well appearing and in no apparent distress. ?HEENT: ?     Head: Normocephalic and atraumatic.    ?     Eyes: Conjunctivae are normal. Sclera is non-icteric.  ?     Mouth/Throat: Mucous membranes are moist.  ?     Neck: Supple with no signs of meningismus. ?Cardiovascular: Regular rate and rhythm. No murmurs, gallops, or rubs. 2+ symmetrical distal pulses are present in all extremities.  ?Respiratory: Normal respiratory effort. Lungs are clear to auscultation bilaterally.  ?Gastrointestinal: Soft, non tender, and non distended with positive bowel sounds. No rebound or guarding. ?Genitourinary: No CVA tenderness. ?Musculoskeletal:  No edema, cyanosis, or erythema of extremities. ?Neurologic: Normal speech and language. Face is symmetric. Moving  all extremities. No gross focal neurologic deficits are appreciated. ?Skin: Skin is warm, dry and intact. No rash noted. ?Psychiatric: Mood and affect are normal. Speech and behavior are normal. ? ?ED Results / Procedures / Treatments  ? ?Labs ?(all labs ordered are listed, but only abnormal results are displayed) ?Labs Reviewed  ?CBC - Abnormal; Notable for the following components:  ?    Result Value  ? WBC 11.5 (*)   ? All other components within normal limits  ?BASIC METABOLIC PANEL - Abnormal; Notable for the following components:  ? Anion gap 4 (*)   ? All other components within normal limits  ?TROPONIN I (HIGH SENSITIVITY)  ?TROPONIN I (HIGH SENSITIVITY)  ? ? ? ?EKG ? ?Sinus tachycardia with a rate of 104, normal intervals, normal axis, no ST elevations or depressions ? ? ?RADIOLOGY ?I, [name], attending MD, have personally viewed and interpreted the images obtained during this visit as below: ? ?Chest x-ray negative ? ? ?___________________________________________________ ?Interpretation by Radiologist:  ?DG Chest 2 View ? ?Result Date: 10/09/2021 ?CLINICAL DATA:  Chest pain. EXAM: CHEST - 2 VIEW COMPARISON:  Chest radiograph dated 09/15/2021. FINDINGS: The heart size and mediastinal contours are within normal limits. Both lungs are clear. The visualized skeletal structures are unremarkable. IMPRESSION: No active cardiopulmonary disease. Electronically Signed   By: Elgie Collard M.D.   On: 10/09/2021 22:15   ? ? ? ?PROCEDURES: ? ?Critical Care performed: No ? ?Procedures ? ? ? ?IMPRESSION / MDM / ASSESSMENT AND PLAN / ED COURSE  ?I reviewed the triage vital signs and the nursing notes. ? ?31 y.o. female with a history of anxiety and depression who presents for evaluation of chest pain.  With daily episodes of chest pain for the last 3 months.  Has been seen in the ER for several visits for the same complaint, has been seen by cardiology 3 weeks ago and is currently being evaluated by neurology  for her pain.  She is otherwise well-appearing in no distress with normal vital signs.  Exam is nonfocal.  I did have an extensive review of everything that has been done for her for this pain.  Over the last month she has had for chest x-rays, 1 CT angio of the chest, 1 CT abdomen pelvis with contrast, and MRI of the brain, x-ray of the cervical and thoracic spine, and MRI of the cervical spine which did not show any etiology for this pain.  I have also reviewed the notes from cardiology and neurology. ? ?Ddx: Anxiety versus  GERD/indigestion versus musculoskeletal pain.  Atypical for ACS.  Doubt PE with intermittent daily episodes for 3 months and a negative CT angio done a month ago. ? ? ?Plan: EKG, troponin x2, chest x-ray, CBC, metabolic panel.  We will give a dose of Toradol for pain. ? ? ?MEDICATIONS GIVEN IN ED: ?Medications  ?ketorolac (TORADOL) injection 30 mg (30 mg Intramuscular Given 10/09/21 2322)  ? ? ? ?ED COURSE: Work-up again today essentially unremarkable with nonischemic EKG.  2 negative troponins, negative chest x-ray and labs with no significant abnormalities.  Recommended the patient continues to follow-up with her primary care doctor and neurologist for further evaluation.  No indication for admission with normal work-up.  Discussed my standard return precautions ? ? ?Consults: none ? ? ? ? ? ?FINAL CLINICAL IMPRESSION(S) / ED DIAGNOSES  ? ?Final diagnoses:  ?Atypical chest pain  ? ? ? ?Rx / DC Orders  ? ?ED Discharge Orders   ? ? None  ? ?  ? ? ? ?Note:  This document was prepared using Dragon voice recognition software and may include unintentional dictation errors. ? ? ?Please note:  Patient was evaluated in Emergency Department today for the symptoms described in the history of present illness. Patient was evaluated in the context of the global COVID-19 pandemic, which necessitated consideration that the patient might be at risk for infection with the SARS-CoV-2 virus that causes COVID-19.  Institutional protocols and algorithms that pertain to the evaluation of patients at risk for COVID-19 are in a state of rapid change based on information released by regulatory bodies including the

## 2021-10-09 NOTE — ED Triage Notes (Signed)
Patient ambulatory to triage with steady gait, without difficulty or distress noted; pt reports upper CP radiating into back for "several months" accomp by dizziness; st has been seen for such with negative findings ?

## 2021-10-09 NOTE — ED Notes (Signed)
Dr. Veronese at the bedside ?

## 2021-10-10 LAB — TROPONIN I (HIGH SENSITIVITY): Troponin I (High Sensitivity): 4 ng/L (ref <18)

## 2021-10-10 NOTE — ED Notes (Signed)
Straight stick attempt x 2 without success for repeat troponin. Called another RN to try. ?

## 2021-10-11 ENCOUNTER — Ambulatory Visit (INDEPENDENT_AMBULATORY_CARE_PROVIDER_SITE_OTHER): Payer: Medicaid Other | Admitting: Obstetrics

## 2021-10-11 ENCOUNTER — Other Ambulatory Visit: Payer: Self-pay | Admitting: Obstetrics and Gynecology

## 2021-10-11 ENCOUNTER — Encounter: Payer: Self-pay | Admitting: Obstetrics

## 2021-10-11 ENCOUNTER — Other Ambulatory Visit (HOSPITAL_COMMUNITY)
Admission: RE | Admit: 2021-10-11 | Discharge: 2021-10-11 | Disposition: A | Discharge status: Home / Self Care | Payer: Medicaid Other | Source: Ambulatory Visit | Attending: Obstetrics | Admitting: Obstetrics

## 2021-10-11 ENCOUNTER — Other Ambulatory Visit: Payer: Self-pay

## 2021-10-11 VITALS — BP 128/78 | HR 69 | Ht 67.0 in | Wt 225.0 lb

## 2021-10-11 DIAGNOSIS — R569 Unspecified convulsions: Secondary | ICD-10-CM | POA: Insufficient documentation

## 2021-10-11 DIAGNOSIS — Z01419 Encounter for gynecological examination (general) (routine) without abnormal findings: Secondary | ICD-10-CM | POA: Diagnosis not present

## 2021-10-11 DIAGNOSIS — Z0000 Encounter for general adult medical examination without abnormal findings: Secondary | ICD-10-CM

## 2021-10-11 DIAGNOSIS — Z124 Encounter for screening for malignant neoplasm of cervix: Secondary | ICD-10-CM | POA: Insufficient documentation

## 2021-10-11 NOTE — Progress Notes (Signed)
SUBJECTIVE ? ?HPI ? ?Kendra [name] is a 31 y.o.-year-old female who presents for an annual gynecological exam and Pap today. She has some lesions on her vulva that she would like examined. She is currently uses POPs for contraception. She reports normal monthly periods. She is sexually active with one female partner. She is interested in scheduling a BTL consult. She has a history of pain with unknown cause since December and is followed by cardiology, neurology, and rheumatology.  ? ?Medical/Surgical History ?Past Medical History:  ?Diagnosis Date  ? Anxiety   ? Depression   ? Headache   ? stress  ? History of cannabis abuse   ? Seizures (HCC)   ? age 65. syncopal episode. Hit head. had seizure.   ? Tobacco abuse   ? ?Past Surgical History:  ?Procedure Laterality Date  ? COLON SURGERY  2013  ? pt states "colonoscopy while awake"  ? COLONOSCOPY WITH PROPOFOL N/A 12/14/2015  ? Procedure: COLONOSCOPY WITH PROPOFOL;  Surgeon: Midge Minium, MD;  Location: [facility];  Service: Endoscopy;  Laterality: N/A;  ? ESOPHAGOGASTRODUODENOSCOPY (EGD) WITH PROPOFOL N/A 12/14/2015  ? Procedure: ESOPHAGOGASTRODUODENOSCOPY (EGD) WITH PROPOFOL;  Surgeon: Midge Minium, MD;  Location: [facility];  Service: Endoscopy;  Laterality: N/A;  ? WISDOM TOOTH EXTRACTION  2013  ? ? ?Social History ?Lives with husband ?Substances: smokes less than one ppd, no MJ use for 3 months, denies EtOH, taking Suboxone. ? ?Obstetric History ?OB History   ? ? Gravida  ?2  ? Para  ?1  ? Term  ?1  ? Preterm  ?   ? AB  ?1  ? Living  ?1  ?  ? ? SAB  ?   ? IAB  ?   ? Ectopic  ?   ? Multiple  ?   ? Live Births  ?1  ?   ?  ?  ?  ? ?GYN/Menstrual History ?Patient's last menstrual period was 10/09/2021 (exact date). ?Regular monthly periods ?Last Pap: Approximately 4 years ago ?Contraception: Micronor ? ?Prevention ?Endorses regular dental and eye exams ?Mammogram at 41 ? ?Current Medications ?Outpatient Medications Prior to Visit  ?Medication Sig  ?  buprenorphine-naloxone (SUBOXONE) 8-2 mg SUBL SL tablet Place 1 tablet under the tongue daily. Patient uses 8mg  film bid  ? gabapentin (NEURONTIN) 100 MG capsule Take 100 mg by mouth 3 (three) times daily.  ? methocarbamol (ROBAXIN) 500 MG tablet Take 1 tablet (500 mg total) by mouth every 6 (six) hours as needed.  ? norethindrone (MICRONOR) 0.35 MG tablet Take 1 tablet by mouth daily.  ? propranolol (INDERAL) 40 MG tablet Take 40 mg by mouth 2 (two) times daily.  ? sertraline (ZOLOFT) 50 MG tablet Take 50 mg by mouth daily.  ? albuterol (VENTOLIN HFA) 108 (90 Base) MCG/ACT inhaler Inhale 2 puffs into the lungs every 6 (six) hours as needed for wheezing or shortness of breath. (Patient not taking: Reported on 09/13/2021)  ? ALPRAZolam (XANAX) 0.5 MG tablet Take 1 tablet by mouth 3 (three) times daily as needed. (Patient not taking: Reported on 10/09/2021)  ? dicyclomine (BENTYL) 10 MG capsule Take 1 capsule (10 mg total) by mouth 4 (four) times daily.  ? escitalopram (LEXAPRO) 5 MG tablet Take 1 tablet by mouth daily. (Patient not taking: Reported on 10/09/2021)  ? etodolac (LODINE) 400 MG tablet Take 1 tablet (400 mg total) by mouth 2 (two) times daily. (Patient not taking: Reported on 10/09/2021)  ? lidocaine (LIDODERM) 5 %  Place 1 patch onto the skin every 12 (twelve) hours. Remove & Discard patch within 12 hours or as directed by MD (Patient not taking: Reported on 10/09/2021)  ? pantoprazole (PROTONIX) 20 MG tablet Take 20 mg by mouth 2 (two) times daily. (Patient not taking: Reported on 10/09/2021)  ? polyethylene glycol (MIRALAX) 17 g packet Take 17 g by mouth daily. (Patient not taking: Reported on 10/09/2021)  ? predniSONE (DELTASONE) 10 MG tablet Take 6 tablets (60 mg total) by mouth as directed. Take 60 mg x 2 days then 50 mg x 2 days then 40 mg x 2 days then 30 mg x 2 days then 20 mg x 2 days then 10 mg x 2 days then STOP (Patient not taking: Reported on 10/09/2021)  ? ?No facility-administered medications prior  to visit.  ?  ? ? Upstream - 10/11/21 1156   ? ?  ? Pregnancy Intention Screening  ? Does the patient want to become pregnant in the next year? No   ? Would the patient like to discuss contraceptive options today? No   ?  ? Contraception Wrap Up  ? Current Method Oral Contraceptive   ? End Method Oral Contraceptive   ? Contraception Counseling Provided No   ? ?  ?  ? ?  ? ?The pregnancy intention screening data noted above was reviewed. Potential methods of contraception were discussed. The patient elected to proceed with Oral Contraceptive.  ? ?ROS ?History obtained from the patient ?General ROS: negative for - chills or fatigue ?Hematological and Lymphatic ROS: negative for - bleeding problems, blood clots, or swollen lymph nodes ?Breast ROS: negative for breast lumps ?Respiratory ROS: no cough, shortness of breath, or wheezing ?Cardiovascular ROS: positive for - daily stabbing chest pain with associated arm numbness (sees cardiology) ?Gastrointestinal ROS: no abdominal pain, change in bowel habits, or black or bloody stools ?positive for - constipation ?Genito-Urinary ROS: no dysuria, trouble voiding, or hematuria ?positive for - vulvar/vaginal symptoms ?Dermatological ROS: positive for probably vitiligo ?negative for eczema and mole changes ? ?No flowsheet data found.  ? ?OBJECTIVE ? ?Last Weight  Most recent update: 10/11/2021 11:01 AM  ? ? Weight  ?102.1 kg (225 lb)  ?      ? ?  ?  ?Body mass index is 35.24 kg/m?.  ? ? ?BP 128/78   Pulse 69   Ht  (1.702 m)   Wt 225 lb (102.1 kg)   LMP 10/09/2021 (Exact Date) Comment: implant removed 3 days ago  started on progestrone  BMI 35.24 kg/m?  ?General appearance: alert, cooperative, and appears stated age ?Head: Normocephalic, without obvious abnormality, atraumatic ?Eyes: negative findings: lids and lashes normal and conjunctivae and sclerae normal ?Neck: no adenopathy, supple, symmetrical, trachea midline, and thyroid not enlarged, symmetric, no  tenderness/mass/nodules ?Lungs: clear to auscultation bilaterally ?Breasts: normal appearance, no masses or tenderness, No nipple retraction or dimpling, No axillary or supraclavicular adenopathy, Normal to palpation without dominant masses ?Heart: regular rate and rhythm, S1, S2 normal, no murmur, click, rub or gallop ?Abdomen: soft, non-tender; bowel sounds normal; no masses,  no organomegaly ?Pelvic: cervix normal in appearance, external genitalia normal, no cervical motion tenderness, rectovaginal septum normal, vagina normal without discharge, and mild vulvar erythema/irritation noted; small cyst palpated on left labia majora; non-tender to palpation; no redness or exudate. Pap collected. ?Extremities: extremities normal, atraumatic, no cyanosis or edema ?Pulses: 2+ and symmetric ?Skin:  Normal color, texture, turgor. Some areas of depigmentation noted on inner thighs  and around eyes. ?Lymph nodes: Cervical, supraclavicular, and axillary nodes normal. and one slightly enlarged cervical lymph node on right side noted.  ? ?ASSESSMENT  ?1) Annual exam ?2) Pap due ?3) Desires BTL ?4) Labial cyst/folliculitis vs hydratinitis suppurativa ? ?PLAN ?1) Physical exam as noted. STI screening and blood work done recently in ED. ?2) Pap collected. F/u based on results. ?3) Briefly discussed BTL. Will schedule consult with MD. ?4) Reviewed comfort measures including warm compresses, sitz baths, NSAIDs for pain. Return for further assessment if no improvement. Also discussed comfort measures for vulvar irritation. ? ?Return in one year for annual exam or as needed for concerns. ? ? ?Guadlupe Spanish, CNM  ?

## 2021-10-11 NOTE — Patient Outreach (Signed)
?Medicaid Managed Care   ?Nurse Care Manager Note ? ?10/11/2021 ?Name:  Kendra [name] MRN:  [id number] DOB:  June 20, 1991 ? ?Kendra [name] is an 31 y.o. year old female who is a primary patient of [name], [place].  The [facility] Managed Care Coordination team was consulted for assistance with:    ?Chronic healthcare management needs, anxiety, tobacco use, neck, arm, and chest pain ? ?[name] was given information about Medicaid Managed Care Coordination team services today. [name] Patient agreed to services and verbal consent obtained. ? ?Engaged with patient by telephone for follow up visit in response to provider referral for case management and/or care coordination services.  ? ?Assessments/Interventions:  Review of past medical history, allergies, medications, health status, including review of consultants reports, laboratory and other test data, was performed as part of comprehensive evaluation and provision of chronic care management services. ? ?SDOH (Social Determinants of Health) assessments and interventions performed: ?SDOH Interventions   ? ?Flowsheet Row Most Recent Value  ?SDOH Interventions   ?Food Insecurity Interventions Intervention Not Indicated  ? ?  ?Care Plan ? ?Allergies  ?Allergen Reactions  ? Penicillins   ?  Unknown reaction - STATES CAN TAKE MED NOW  ? ? ?Medications Reviewed Today   ? ? Reviewed by [name], RN (Registered Nurse) on 10/11/21 at 1400  Med List Status: <None>  ? ?Medication Order Taking? Sig Documenting Provider Last Dose Status Informant  ?albuterol (VENTOLIN HFA) 108 (90 Base) MCG/ACT inhaler [id number]  Inhale 2 puffs into the lungs every 6 (six) hours as needed for wheezing or shortness of breath.  ?Patient not taking: Reported on 09/13/2021  ? Rudene Re, MD  Active   ?ALPRAZolam (XANAX) 0.5 MG tablet [id number]  Take 1 tablet by mouth 3 (three) times daily as needed.  ?Patient not taking: Reported on 10/09/2021  ? [provider]  Active   ?buprenorphine-naloxone (SUBOXONE) 8-2 mg SUBL SL tablet [id number]  Place 1 tablet under the tongue daily. Patient uses 8mg  film bid [provider]  Active Self  ?dicyclomine (BENTYL) 10 MG capsule [id number]  Take 1 capsule (10 mg total) by mouth 4 (four) times daily. [name], MD  Expired 12/18/15 2359 Self  ?escitalopram (LEXAPRO) 5 MG tablet [id number]  Take 1 tablet by mouth daily.  ?Patient not taking: Reported on 10/09/2021  ? [provider]  Active   ?etodolac (LODINE) 400 MG tablet [id number]  Take 1 tablet (400 mg total) by mouth 2 (two) times daily.  ?Patient not taking: Reported on 10/09/2021  ? [name], PA-C  Active   ?gabapentin (NEURONTIN) 100 MG capsule [id number]  Take 100 mg by mouth 3 (three) times daily. [provider]  Active   ?lidocaine (LIDODERM) 5 % [id number]  Place 1 patch onto the skin every 12 (twelve) hours. Remove & Discard patch within 12 hours or as directed by MD  ?Patient not taking: Reported on 10/09/2021  ? [name], PA-C  Active   ?         ?Med Note (COTTON, JEFFICA B   Sun Sep 16, 2021  2:19 PM) Not started using as of yet  ?methocarbamol (ROBAXIN) 500 MG tablet [id number]  Take 1 tablet (500 mg total) by mouth every 6 (six) hours as needed. [name], PA-C  Active   ?         ?Med Note [name]   Tue Oct 09, 2021 10:38 PM)    ?  norethindrone (MICRONOR) 0.35 MG tablet [id number]  Take 1 tablet by mouth daily. [provider]  Active   ?pantoprazole (PROTONIX) 20 MG tablet [id number]  Take 20 mg by mouth 2 (two) times daily.  ?Patient not taking: Reported on 10/09/2021  ? [provider]  Active   ?polyethylene glycol (MIRALAX) 17 g packet [id number]  Take 17 g by mouth daily.  ?Patient not taking: Reported on 10/09/2021  ? [name], MD  Active   ?predniSONE (DELTASONE) 10 MG tablet [id number]  Take 6 tablets (60 mg total) by mouth as directed. Take 60 mg x 2 days then 50  mg x 2 days then 40 mg x 2 days then 30 mg x 2 days then 20 mg x 2 days then 10 mg x 2 days then STOP  ?Patient not taking: Reported on 10/09/2021  ? Ward, [name], DO  Active   ?propranolol (INDERAL) 40 MG tablet [id number] No Take 40 mg by mouth 2 (two) times daily.  ?Patient not taking: Reported on 10/11/2021  ? [provider] Not Taking Active   ?sertraline (ZOLOFT) 50 MG tablet [id number]  Take 50 mg by mouth daily. [provider]  Active Self  ? ?  ?  ? ?  ? ?Patient Active Problem List  ? Diagnosis Date Noted  ? Noninfectious diarrhea   ? Non-intractable cyclical vomiting with nausea   ? Diarrhea   ? Anxiety and depression 01/19/2015  ? History of cannabis dependence/abuse ([place]) 01/19/2015  ? Tobacco abuse 01/19/2015  ? ?Conditions to be addressed/monitored per PCP order:  Chronic healthcare management needs, anxiety, tobacco use, neck, arm, and chest pain ? ?Care Plan : RN Care Manager Plan of Care  ?Updates made by [name], RN since 10/11/2021 12:00 AM  ?  ? ?Problem: RNCM Effective Management of patient needs   ?Priority: High  ?  ? ?Long-Range Goal: Self-Management Plan Developed   ?Start Date: 10/11/2021  ?Expected End Date: 01/11/2022  ?This Visit's Progress: Not on track  ?Priority: High  ?Note:   ?Current Barriers:  ?Knowledge Deficits related to plan of care for management of Anxiety . ?10/11/21:  Patient with neck, chest and arm pain-has upcoming appt with Orthopedist and is waiting on an appt with Rheumatology.  Would like to find a new PCP and is interested in virtual counseling ? ?RNCM Clinical Goal(s):  ?Patient will verbalize understanding of plan for management of Anxiety as evidenced by taking prescribed medications and continuing therapy ?take all medications exactly as prescribed and will call provider for medication related questions ?demonstrate understanding of rationale for each prescribed medication ?attend all scheduled medical appointments: ?continue to work with  RN Care Manager to address care management and care coordination needs related to  Anxiety as evidenced by adherence to CM Team Scheduled appointments through collaboration with RN Care manager, provider, and care team.  ? ?Interventions: ?Inter-disciplinary care team collaboration (see longitudinal plan of care) ?Evaluation of current treatment plan related to  self management and patient's adherence to plan as established by provider ? ? (Status:  New goal.)  Long Term Goal ?Evaluation of current treatment plan related to Anxiety ?Discussed plans with patient for ongoing care management follow up and provided patient with direct contact information for care management team ?Evaluation of current treatment plan related to anxiety and patient's adherence to plan as established by provider ?Reviewed medications with patient ?Reviewed scheduled/upcoming provider appointments ?Discussed plans with patient for ongoing care management follow up  and provided patient with direct contact information for care management team ?Screening for signs and symptoms of depression related to chronic disease state  ?Assessed social determinant of health barriers ?Collaborated with LCSW ?LCSW referral for therapy services ?Care Guide referral for PCP resources. ?Collaborated with Care Guide for resources. ? ?Patient Goals/Self-Care Activities: ?Take all medications as prescribed ?Attend all scheduled provider appointments ?Call pharmacy for medication refills 3-7 days in advance of running out of medications ?Perform all self care activities independently  ?Perform IADL's (shopping, preparing meals, housekeeping, managing finances) independently ?Call provider office for new concerns or questions  ? ?Follow Up Plan:  The care management team will reach out to the patient again over the next 30 days.  ? ?Long-Range Goal: Rstablish Plan of Care for Disease Management Needs   ?Start Date: 09/13/2021  ?Expected End Date: 12/11/2021   ?Priority: High  ?Note:   ?Timeframe:  Long-Range Goal ?Priority:  High ?Start Date:        09/13/21                     ?Expected End Date:      ongoing                ? ?Follow Up Date 11/11/21 ?  ?- schedule appo

## 2021-10-11 NOTE — Patient Instructions (Signed)
Hi Kendra [name], nice to speak with you today-have a great afternoon!! ? ?Kendra [name] was given information about Medicaid Managed Care team care coordination services as a part of their [facility] Medicaid benefit. Kendra [name] verbally consented to engagement with the [facility] Managed Care team.  ? ?If you are experiencing a medical emergency, please call 911 or report to your local emergency department or urgent care.  ? ?If you have a non-emergency medical problem during routine business hours, please contact your provider's office and ask to speak with a nurse.  ? ?For questions related to your [facility] health plan, please call: [phone] or go here:[email or web address] ? ?If you would like to schedule transportation through your [facility] plan, please call the following number at least 2 days in advance of your appointment: [phone]. ? You can also use the MTM portal or MTM mobile app to manage your rides. For the portal, please go to [email or web address] ? ?Call the Behavioral Health Crisis Line at [phone]) [phone], at any time, 24 hours a day, 7 days a week. If you are in danger or need immediate medical attention call 911. ? ?If you would like help to quit smoking, call 1-800-QUIT-NOW (8138246305) OR Espa?ol: 1-855-D?jelo-Ya [phone]) o para m?s informaci?n haga clic aqu? or Text READY to 200-400 to register via text ? ?Kendra [name] - following are the goals we discussed in your visit today:  ? Goals Addressed   ? ?Long-Range Goal: Rstablish Plan of Care for Disease Management Needs   ?Start Date: 09/13/2021  ?Expected End Date: 12/11/2021  ?Priority: High  ?Note:   ?Timeframe:  Long-Range Goal ?Priority:  High ?Start Date:        09/13/21                     ?Expected End Date:      ongoing                ? ?Follow Up Date 11/11/21 ?  ?- schedule appointment for flu shot ?- schedule appointment for vaccines needed due to my age or health ?- schedule recommended health  tests (blood work, mammogram, colonoscopy, pap test) ?- schedule and keep appointment for annual check-up  ?  ?Why is this important?   ?Screening tests can find diseases early when they are easier to treat.  ?Your doctor or nurse will talk with you about which tests are important for you.  ?Getting shots for common diseases like the flu and shingles will help prevent them.   ?10/11/21:  Patient recently seen and evaluated by GYN, Neurology, and Cardiology.  Upcoming appointments with Orthopedist and Rheumatology.  ? ?Patient verbalizes understanding of instructions and care plan provided today and agrees to view in MyChart. Active MyChart status confirmed with patient.   ? ?The Managed Medicaid care management team will reach out to the patient again over the next 30 days.  ?The  Patient   has been provided with contact information for the Managed Medicaid care management team and has been advised to call with any health related questions or concerns.  ? ?Kathi Der RN, BSN ?[place]  [facility] ?Care Management Coordinator - Managed Medicaid High Risk ?[phone] ?  ?Following is a copy of your plan of care:  ?Care Plan : RN Care Manager Plan of Care  ?Updates made by [name], RN since 10/11/2021 12:00 AM  ?  ? ?Problem: RNCM Effective Management of patient needs   ?Priority: High  ?  ? ?  Long-Range Goal: Self-Management Plan Developed   ?Start Date: 10/11/2021  ?Expected End Date: 01/11/2022  ?This Visit's Progress: Not on track  ?Priority: High  ?Note:   ?Current Barriers:  ?Knowledge Deficits related to plan of care for management of Anxiety . ?10/11/21:  Patient with neck, chest and arm pain-has upcoming appt with Orthopedist and is waiting on an appt with Rheumatology.  Would like to find a new PCP and is interested in virtual counseling ? ?RNCM Clinical Goal(s):  ?Patient will verbalize understanding of plan for management of Anxiety as evidenced by taking prescribed medications and  continuing therapy ?take all medications exactly as prescribed and will call provider for medication related questions ?demonstrate understanding of rationale for each prescribed medication ?attend all scheduled medical appointments: ?continue to work with RN Care Manager to address care management and care coordination needs related to  Anxiety as evidenced by adherence to CM Team Scheduled appointments through collaboration with RN Care manager, provider, and care team.  ? ?Interventions: ?Inter-disciplinary care team collaboration (see longitudinal plan of care) ?Evaluation of current treatment plan related to  self management and patient's adherence to plan as established by provider ? ? (Status:  New goal.)  Long Term Goal ?Evaluation of current treatment plan related to Anxiety ?Discussed plans with patient for ongoing care management follow up and provided patient with direct contact information for care management team ?Evaluation of current treatment plan related to anxiety and patient's adherence to plan as established by provider ?Reviewed medications with patient ?Reviewed scheduled/upcoming provider appointments ?Discussed plans with patient for ongoing care management follow up and provided patient with direct contact information for care management team ?Screening for signs and symptoms of depression related to chronic disease state  ?Assessed social determinant of health barriers ?Collaborated with LCSW ?LCSW referral for therapy services ?Care Guide referral for PCP resources. ?Collaborated with Care Guide for resources. ? ?Patient Goals/Self-Care Activities: ?Take all medications as prescribed ?Attend all scheduled provider appointments ?Call pharmacy for medication refills 3-7 days in advance of running out of medications ?Perform all self care activities independently  ?Perform IADL's (shopping, preparing meals, housekeeping, managing finances) independently ?Call provider office for new concerns  or questions  ? ?Follow Up Plan:  The care management team will reach out to the patient again over the next 30 days.  ? ?  ?

## 2021-10-15 ENCOUNTER — Encounter: Payer: Self-pay | Admitting: Obstetrics

## 2021-10-15 ENCOUNTER — Other Ambulatory Visit: Payer: Self-pay | Admitting: Licensed Clinical Social Worker

## 2021-10-15 DIAGNOSIS — Z599 Problem related to housing and economic circumstances, unspecified: Secondary | ICD-10-CM

## 2021-10-15 DIAGNOSIS — F32A Depression, unspecified: Secondary | ICD-10-CM

## 2021-10-15 LAB — CYTOLOGY - PAP
Adequacy: ABSENT
Comment: NEGATIVE
Diagnosis: NEGATIVE
High risk HPV: NEGATIVE

## 2021-10-15 NOTE — Patient Instructions (Signed)
Visit Information ? ?Ms. Kubicek was given information about Medicaid Managed Care team care coordination services as a part of their [facility] Medicaid benefit. [name] verbally consented to engagement with the [facility] Managed Care team.  ? ?If you are experiencing a medical emergency, please call 911 or report to your local emergency department or urgent care.  ? ?If you have a non-emergency medical problem during routine business hours, please contact your provider's office and ask to speak with a nurse.  ? ?For questions related to your [facility] health plan, please call: [phone] or go here:[email or web address] ? ?If you would like to schedule transportation through your [facility] plan, please call the following number at least 2 days in advance of your appointment: [phone]. ? You can also use the MTM portal or MTM mobile app to manage your rides. For the portal, please go to [email or web address] ? ?Call the Behavioral Health Crisis Line at [phone], at any time, 24 hours a day, 7 days a week. If you are in danger or need immediate medical attention call 911. ? ?If you would like help to quit smoking, call 1-800-QUIT-NOW ((414)028-0865) OR Espa?ol: 1-855-D?jelo-Ya 5851899008) o para m?s informaci?n haga clic aqu? or Text READY to 200-400 to register via text ? ?Start Date:  ? Priority: High ? ?Timeframe:  Long-Range Goal ?Priority:  High ?Start Date: 10/15/21            ?Expected End Date:  ongoing                   ?  ?Follow Up Date--11/08/21 ? ?Current Barriers:  ?Chronic Mental Health needs related to Anxiety and Pain management ?ADL IADL limitations - Patient is unable to drive for 6 months. She has seizure like activity and chronic pain that has affected her ability to function and carry out task throughout the day ?Mental Health Concerns  ?Transportation- Cannot drive at this time ?Suicidal Ideation/Homicidal Ideation: No ? ?Clinical Social Work Goal(s):   ?Over the next 30 days, patient will work with SW monthly by telephone or in person to reduce or manage symptoms related to anxiety management ?demonstrate a reduction in symptoms related to :Anxiety with Excessive Worry, ?Panic Symptoms,  ?patient will work with SW to address concerns related to finding virtual mental health providers ?patient will demonstrate improved health management independence as evidenced by increasing self-care and attending all appointments.  ? ?SDOH Interventions   ? ?Flowsheet Row Most Recent Value  ?SDOH Interventions   ?Depression Interventions/Treatment  Referral to Psychiatry  ? ? ?Patient Self Care Activities:  ?Self administers medications as prescribed ?Attends all scheduled provider appointments ?Ability for insight ?Motivation for treatment ?Strong family or social support ? ? ? ?Please see education materials related to mental health provided by e-mail link. ? ?Dickie La, BSW, MSW, LCSW ?Managed Medicaid LCSW ?[place]  [facility] ?[email or web address] ?Phone: [phone] ? ? ?  ?

## 2021-10-15 NOTE — Patient Outreach (Signed)
?Medicaid Managed Care ?Social Work Note ? ?10/15/2021 ?Name:  Kendra [name] MRN:  [id number] DOB:  September 02, 1990 ? ?Kendra [name] is an 31 y.o. year old female who is a primary patient of Kendra [name], Kendra [name].  The [facility] Managed Care Coordination team was consulted for assistance with:  Mental Health Counseling and Resources ? ?[name] was given information about Medicaid Managed Care Coordination team services today. Kendra [name] Patient agreed to services and verbal consent obtained. ? ?Engaged with patient  for by telephone forinitial visit in response to referral for case management and/or care coordination services.  ? ?Assessments/Interventions:  Review of past medical history, allergies, medications, health status, including review of consultants reports, laboratory and other test data, was performed as part of comprehensive evaluation and provision of chronic care management services. ? ?SDOH: (Social Determinant of Health) assessments and interventions performed: ?SDOH Interventions   ? ?Flowsheet Row Most Recent Value  ?SDOH Interventions   ?Depression Interventions/Treatment  Referral to Psychiatry  ? ?  ? ? ?Advanced Directives Status:  Not addressed in this encounter. ? ?Care Plan ?                ?Allergies  ?Allergen Reactions  ? Penicillins   ?  Unknown reaction - STATES CAN TAKE MED NOW  ? ? ?Medications Reviewed Today   ? ? Reviewed by Kendra Chandler, RN (Registered Nurse) on 10/11/21 at 1400  Med List Status: <None>  ? ?Medication Order Taking? Sig Documenting Provider Last Dose Status Informant  ?albuterol (VENTOLIN HFA) 108 (90 Base) MCG/ACT inhaler [id number]  Inhale 2 puffs into the lungs every 6 (six) hours as needed for wheezing or shortness of breath.  ?Patient not taking: Reported on 09/13/2021  ? Kendra Sickle, MD  Active   ?ALPRAZolam (XANAX) 0.5 MG tablet [id number]  Take 1 tablet by mouth 3 (three) times daily as needed.  ?Patient not taking: Reported on 10/09/2021   ? [provider]  Active   ?buprenorphine-naloxone (SUBOXONE) 8-2 mg SUBL SL tablet [id number]  Place 1 tablet under the tongue daily. Patient uses 8mg  film bid [provider]  Active Self  ?dicyclomine (BENTYL) 10 MG capsule  Take 1 capsule (10 mg total) by mouth 4 (four) times daily. [id number], MD  Expired 12/18/15 2359 Self  ?escitalopram (LEXAPRO) 5 MG tablet 12/20/15  Take 1 tablet by mouth daily.  ?Patient not taking: Reported on 10/09/2021  ? [provider]  Active   ?etodolac (LODINE) 400 MG tablet 10/11/2021  Take 1 tablet (400 mg total) by mouth 2 (two) times daily.  ?Patient not taking: Reported on 10/09/2021  ? 10/11/2021, PA-C  Active   ?gabapentin (NEURONTIN) 100 MG capsule [name]  Take 100 mg by mouth 3 (three) times daily. [provider]  Active   ?lidocaine (LIDODERM) 5 % [id number]  Place 1 patch onto the skin every 12 (twelve) hours. Remove & Discard patch within 12 hours or as directed by MD  ?Patient not taking: Reported on 10/09/2021  ? 10/11/2021, PA-C  Active   ?         ?Med Note (COTTON, JEFFICA B   Sun Sep 16, 2021  2:19 PM) Not started using as of yet  ?methocarbamol (ROBAXIN) 500 MG tablet Sep 18, 2021  Take 1 tablet (500 mg total) by mouth every 6 (six) hours as needed. [id number], PA-C  Active   ?         ?  Med Note Kendra [name]   Tue Oct 09, 2021 10:38 PM)    ?norethindrone (MICRONOR) 0.35 MG tablet [id number]  Take 1 tablet by mouth daily. [provider]  Active   ?pantoprazole (PROTONIX) 20 MG tablet [id number]  Take 20 mg by mouth 2 (two) times daily.  ?Patient not taking: Reported on 10/09/2021  ? [provider]  Active   ?polyethylene glycol (MIRALAX) 17 g packet [id number]  Take 17 g by mouth daily.  ?Patient not taking: Reported on 10/09/2021  ? [name], MD  Active   ?predniSONE (DELTASONE) 10 MG tablet [id number]  Take 6 tablets (60 mg total) by mouth as directed. Take 60 mg x 2  days then 50 mg x 2 days then 40 mg x 2 days then 30 mg x 2 days then 20 mg x 2 days then 10 mg x 2 days then STOP  ?Patient not taking: Reported on 10/09/2021  ? Ward, Kendra Maw, DO  Active   ?propranolol (INDERAL) 40 MG tablet [id number] No Take 40 mg by mouth 2 (two) times daily.  ?Patient not taking: Reported on 10/11/2021  ? [provider] Not Taking Active   ?sertraline (ZOLOFT) 50 MG tablet [id number]  Take 50 mg by mouth daily. [provider]  Active Self  ? ?  ?  ? ?  ? ? ?Patient Active Problem List  ? Diagnosis Date Noted  ? Noninfectious diarrhea   ? Non-intractable cyclical vomiting with nausea   ? Diarrhea   ? Anxiety and depression 01/19/2015  ? History of cannabis dependence/abuse (HCC) 01/19/2015  ? Tobacco abuse 01/19/2015  ? ? ?Conditions to be addressed/monitored per PCP order:  Anxiety and Depression ? ?Start Date:  ? Priority: High ? ?Timeframe:  Long-Range Goal ?Priority:  High ?Start Date: 10/15/21            ?Expected End Date:  ongoing                   ?  ?Follow Up Date--11/08/21 ? ?Current Barriers:  ?Chronic Mental Health needs related to Anxiety and Pain management ?ADL IADL limitations - Patient is unable to drive for 6 months. She has seizure like activity and chronic pain that has affected her ability to function and carry out task throughout the day ?Mental Health Concerns  ?Transportation- Cannot drive at this time ?Suicidal Ideation/Homicidal Ideation: No ? ?Clinical Social Work Goal(s):  ?Over the next 30 days, patient will work with SW monthly by telephone or in person to reduce or manage symptoms related to anxiety management ?demonstrate a reduction in symptoms related to :Anxiety with Excessive Worry, ?Panic Symptoms,  ?patient will work with SW to address concerns related to finding virtual mental health providers ?patient will demonstrate improved health management independence as evidenced by increasing self-care and attending all appointments.   ? ?Interventions: ?Patient interviewed and appropriate assessments performed: brief mental health assessment ?PHQ 2 ?PHQ 9 ?SDOH Interventions   ? ?Flowsheet Row Most Recent Value  ?SDOH Interventions   ?Depression Interventions/Treatment  Referral to Psychiatry  ? ?  ? ?Patient interviewed and appropriate assessments performed ?Provided mental health counseling with regard to anxiety and stress (mental health diagnosis or concern) ?Provided patient with information about available mental health resources within her area. Patient was emailed a list of these on 10/15/21 for her to review ?Discussed plans with patient for ongoing care management follow up and provided patient with direct contact information for care management team ?  Assisted patient/caregiver with obtaining information about health plan benefits ?Referred patient to [facility] (mental health provider) for long term follow up and therapy/counseling and psychiatry (virtual) ?Patient has a strong support network which includes her mother, father, grandparents and spouse ?Patient has chronic pain that affects her mood ?Motivational Interviewing employed ?PHQ2/ PHQ9 completed ?Active listening / Reflection utilized  ?Emotional Support Provided ?Problem Solving /Task Center strategies reviewed ?Provided psychoeducation for mental health needs  ?Quality of sleep assessed & Sleep Hygiene techniques promoted  ?Participation in counseling encouraged  ?Verbalization of feelings encouraged  ?Discussed referral for psychiatry: [facility] ?Made referral to [facility] for counseling and psychiatry  ? ?Patient Self Care Activities:  ?Self administers medications as prescribed ?Attends all scheduled provider appointments ?Ability for insight ?Motivation for treatment ?Strong family or social support ? ?Patient Coping Strengths:  ?Supportive Relationships ?Hopefulness ?Self Advocate ?Able to Communicate Effectively ? ?Patient Self Care Deficits:  ?Unable to perform ADLs  independently ? ?Patient Goals:  ? ?Initial goal documentation ? ?10 LITTLE Things To Do When You?re Feeling Too Down To Do Anything ? ?Take a shower. ?Even if you plan to stay in all day long and not see a soul, take a show

## 2021-10-16 ENCOUNTER — Telehealth: Payer: Self-pay

## 2021-10-16 ENCOUNTER — Encounter: Payer: Self-pay | Admitting: Obstetrics and Gynecology

## 2021-10-16 ENCOUNTER — Other Ambulatory Visit: Payer: Self-pay

## 2021-10-16 ENCOUNTER — Ambulatory Visit (INDEPENDENT_AMBULATORY_CARE_PROVIDER_SITE_OTHER): Payer: Medicaid Other | Admitting: Obstetrics and Gynecology

## 2021-10-16 VITALS — BP 131/80 | HR 97 | Ht 67.0 in | Wt 225.7 lb

## 2021-10-16 DIAGNOSIS — Z3009 Encounter for other general counseling and advice on contraception: Secondary | ICD-10-CM

## 2021-10-16 NOTE — Telephone Encounter (Signed)
? ?  Telephone encounter was:  Successful.  ?10/16/2021 ?Name: Kendra [name] MRN: [id number] DOB: August 07, 1990 ? ?Kendra [name] is a 31 y.o. year old female who is a primary care patient of [name], [place] . The community resource team was consulted for assistance with  primary care list. ? ?Care guide performed the following interventions: Spoke with  patient verified email address [email or web address] sent PDF list of [place] Medicaid [facility] Primary Care Physician/Internal Medicine in the [place] area. Also included the website/search for [facility] Medicaid Wellcare. ? ?Follow Up Plan:  Care guide will follow up with patient by phone over the next 7-10 days ? ?Constantina Laseter, AAS Paralegal, CHC ?Care Guide  Embedded Care Coordination ?[place]  Care Management  ?[address] ?[place], [place] [address] ?[email or web address]  ?? [phone]   ?[email or web address] ?  ?

## 2021-10-16 NOTE — Progress Notes (Signed)
Patient presents today to discussion BTL. She states her reasoning is due to not wanting to be on birth control and that she is done having children. Patient states no other questions or concerns at this time.  ?

## 2021-10-16 NOTE — Progress Notes (Signed)
HPI: ?     [name] is a 31 y.o. G2P1011 who LMP was Patient's last menstrual period was 10/09/2021 (exact date). ? ?Subjective:  ? ?She presents today to discuss permanent sterilization.  She is convinced that she like to have this.  She says that she and her partner have discussed this in detail and they are absolutely certain they do not want any more children.  She is currently taking progesterone only OCPs. ?Of significant note patient does smoke cigarettes. ? ?  Hx: ?The following portions of the patient's history were reviewed and updated as appropriate: ?            She  has a past medical history of Anxiety, Depression, Headache, History of cannabis abuse, Seizures (HCC), and Tobacco abuse. ?She does not have any pertinent problems on file. ?She  has a past surgical history that includes Wisdom tooth extraction (2013); Colon surgery (2013); Colonoscopy with propofol (N/A, 12/14/2015); and Esophagogastroduodenoscopy (egd) with propofol (N/A, 12/14/2015). ?Her family history includes Congestive Heart Failure in her maternal grandfather; Dementia in her maternal grandmother; Diabetes in her maternal grandfather; Diverticulitis in her maternal grandmother. ?She  reports that she has been smoking cigarettes. She has a 4.00 pack-year smoking history. She has never used smokeless tobacco. She reports that she does not currently use drugs after having used the following drugs: Marijuana. She reports that she does not drink alcohol. ?She has a current medication list which includes the following prescription(s): buprenorphine-naloxone, gabapentin, methocarbamol, norethindrone, polyethylene glycol, sertraline, dicyclomine, lidocaine, and prednisone. ?She is allergic to penicillins. ?      ?Review of Systems:  ?Review of Systems ? ?Constitutional: Denied constitutional symptoms, night sweats, recent illness, fatigue, fever, insomnia and weight loss.  ?Eyes: Denied eye symptoms, eye pain, photophobia,  vision change and visual disturbance.  ?Ears/Nose/Throat/Neck: Denied ear, nose, throat or neck symptoms, hearing loss, nasal discharge, sinus congestion and sore throat.  ?Cardiovascular: Denied cardiovascular symptoms, arrhythmia, chest pain/pressure, edema, exercise intolerance, orthopnea and palpitations.  ?Respiratory: Denied pulmonary symptoms, asthma, pleuritic pain, productive sputum, cough, dyspnea and wheezing.  ?Gastrointestinal: Denied, gastro-esophageal reflux, melena, nausea and vomiting.  ?Genitourinary: Denied genitourinary symptoms including symptomatic vaginal discharge, pelvic relaxation issues, and urinary complaints.  ?Musculoskeletal: Denied musculoskeletal symptoms, stiffness, swelling, muscle weakness and myalgia.  ?Dermatologic: Denied dermatology symptoms, rash and scar.  ?Neurologic: Denied neurology symptoms, dizziness, headache, neck pain and syncope.  ?Psychiatric: Denied psychiatric symptoms, anxiety and depression.  ?Endocrine: Denied endocrine symptoms including hot flashes and night sweats.  ? ?Meds: ?  ?Current Outpatient Medications on File Prior to Visit  ?Medication Sig Dispense Refill  ? buprenorphine-naloxone (SUBOXONE) 8-2 mg SUBL SL tablet Place 1 tablet under the tongue daily. Patient uses 8mg  film bid    ? gabapentin (NEURONTIN) 100 MG capsule Take 100 mg by mouth 3 (three) times daily.    ? methocarbamol (ROBAXIN) 500 MG tablet Take 1 tablet (500 mg total) by mouth every 6 (six) hours as needed. 15 tablet 0  ? norethindrone (MICRONOR) 0.35 MG tablet Take 1 tablet by mouth daily.    ? polyethylene glycol (MIRALAX) 17 g packet Take 17 g by mouth daily. 14 each 0  ? sertraline (ZOLOFT) 50 MG tablet Take 50 mg by mouth daily.    ? dicyclomine (BENTYL) 10 MG capsule Take 1 capsule (10 mg total) by mouth 4 (four) times daily. 56 capsule 0  ? lidocaine (LIDODERM) 5 % Place 1 patch onto the skin every 12 (twelve)  hours. Remove & Discard patch within 12 hours or as directed by MD  (Patient not taking: Reported on 10/09/2021) 10 patch 0  ? predniSONE (DELTASONE) 10 MG tablet Take 6 tablets (60 mg total) by mouth as directed. Take 60 mg x 2 days then 50 mg x 2 days then 40 mg x 2 days then 30 mg x 2 days then 20 mg x 2 days then 10 mg x 2 days then STOP (Patient not taking: Reported on 10/09/2021) 42 tablet 0  ? ?No current facility-administered medications on file prior to visit.  ? ? ? ? ?Objective:  ?  ? ?Vitals:  ? 10/16/21 0949  ?BP: 131/80  ?Pulse: 97  ? ?Filed Weights  ? 10/16/21 0949  ?Weight: 225 lb 11.2 oz (102.4 kg)  ? ?  ?          ?        ? ?Assessment:  ?  ?G2P1011 ?Patient Active Problem List  ? Diagnosis Date Noted  ? Noninfectious diarrhea   ? Non-intractable cyclical vomiting with nausea   ? Diarrhea   ? Anxiety and depression 01/19/2015  ? History of cannabis dependence/abuse (HCC) 01/19/2015  ? Tobacco abuse 01/19/2015  ? ?  ?1. Sterilization consult   ? ?  ? ? ?Plan:  ?  ?       ? 1.  Multiple methods of birth control discussed in detail.  Special emphasis on IUD and long-acting methods.  Risks and benefits of tubal ligation discussed. ?Patient seems to to remain certain that she wants permanent sterilization. ?Medicaid papers signed today.  Plan follow-up in 4 weeks with scheduling of tubal ligation using Filshie clips. ?Orders ?No orders of the defined types were placed in this encounter. ? ? No orders of the defined types were placed in this encounter. ?  ?  F/U ? No follow-ups on file. ?I spent 22 minutes involved in the care of this patient preparing to see the patient by obtaining and reviewing her medical history (including labs, imaging tests and prior procedures), documenting clinical information in the electronic health record (EHR), counseling and coordinating care plans, writing and sending prescriptions, ordering tests or procedures and in direct communicating with the patient and medical staff discussing pertinent items from her history and physical  exam. ? ?Elonda Husky, M.D. ?10/16/2021 ?10:21 AM ? ? ? ? ?

## 2021-10-16 NOTE — Telephone Encounter (Signed)
? ?  Telephone encounter was:  Successful.  ?10/16/2021 ?Name: Kendra [name] MRN: [id number] DOB: 04/05/1991 ? ?Kendra [name] is a 31 y.o. year old female who is a primary care patient of [name], [place] . The community resource team was consulted for assistance with  primary care physician list ? ?Care guide performed the following interventions: Received email from patient confirming receipt of information sent. ? ?Follow Up Plan:  No further follow up planned at this time. The patient has been provided with needed resources. ? ?Kendra [name], AAS Paralegal, CHC ?Care Guide  Embedded Care Coordination ?[place]  Care Management  ?[address] ?[place], [place] [address] ?[email or web address]  ?? [phone]   ?[email or web address] ?  ?

## 2021-11-07 ENCOUNTER — Other Ambulatory Visit: Payer: Self-pay | Admitting: Obstetrics and Gynecology

## 2021-11-07 NOTE — Patient Outreach (Signed)
Care Coordination ? ?11/07/2021 ? ?[name] ?1990/11/21 ?[id number] ? ?RNCM called patient at scheduled time.  Patient answered and stated she was at the dentist and asked to be called back another time.  RNCM rescheduled appointment. ? ?Kathi Der RN, BSN ?[place]  [facility] ?Care Management Coordinator - Managed Medicaid High Risk ?[phone] ?  ? ? ?

## 2021-11-08 ENCOUNTER — Encounter: Payer: Self-pay | Admitting: Licensed Clinical Social Worker

## 2021-11-08 ENCOUNTER — Other Ambulatory Visit: Payer: Self-pay

## 2021-11-08 NOTE — Patient Instructions (Signed)
[name] ,  ? ?The [facility] Managed Care Team is available to provide assistance to you with your healthcare needs at no cost and as a benefit of your [facility] Health plan. I'm sorry I was unable to reach you today for our scheduled appointment. Our care guide will call you to reschedule our telephone appointment. Please call me at the number below. I am available to be of assistance to you regarding your healthcare needs. .  ? ?Thank you,  ? ?Dickie La, BSW, MSW, LCSW ?Managed Medicaid LCSW ?[place]  [facility] ?[email or web address] ?Phone: [phone] ? ? ?

## 2021-11-08 NOTE — Patient Outreach (Addendum)
[facility] Customer service manager [facility]) Care Management ? ?11/08/2021 ? ?[name] ?October 20, 1990 ?[id number] ? ?LCSW completed [facility] outreach attempt today during scheduled appointment time but was unable to reach patient successfully. A HIPPA compliant voice message was left encouraging patient to return call once available. [facility] emailed patient mental health resources for her to follow up with and encouraged her to complete new intake paperwork for [facility]. LCSW will ask Scheduling Care Guide to reschedule [facility] SW appointment with patient as well. ? ?Dickie La, BSW, MSW, LCSW ?Managed Medicaid LCSW ?[place]  [facility] ?[email or web address] ?Phone: [phone] ? ? ?

## 2021-11-09 ENCOUNTER — Telehealth: Payer: Self-pay | Admitting: Physician Assistant

## 2021-11-09 NOTE — Telephone Encounter (Signed)
.. ?  Medicaid Managed Care  ? ?Unsuccessful Outreach Note ? ?11/09/2021 ?Name: Kendra [name] MRN: [id number] DOB: 05-Mar-1991 ? ?Referred by: [name], PA ?Reason for referral : High Risk Managed Medicaid (I called the patient today to get her phone visit rescheduled with the MM LCSW. I left my name and number on her VM.) ? ? ?An unsuccessful telephone outreach was attempted today. The patient was referred to the case management team for assistance with care management and care coordination.  ? ?Follow Up Plan: The care management team will reach out to the patient again over the next 7 days.  ? ? ?Weston Settle ?Care Guide, High Risk Medicaid Managed Care ?Embedded Care Coordination ?[place]  [facility]  ? ? ?SIGNATURE  ?

## 2021-11-13 ENCOUNTER — Ambulatory Visit (INDEPENDENT_AMBULATORY_CARE_PROVIDER_SITE_OTHER): Payer: Medicaid Other | Admitting: Obstetrics and Gynecology

## 2021-11-13 ENCOUNTER — Encounter: Payer: Self-pay | Admitting: Obstetrics and Gynecology

## 2021-11-13 VITALS — BP 114/79 | HR 84 | Ht 67.0 in | Wt 219.4 lb

## 2021-11-13 DIAGNOSIS — Z01818 Encounter for other preprocedural examination: Secondary | ICD-10-CM | POA: Diagnosis not present

## 2021-11-13 NOTE — H&P (Signed)
? ?    PRE-OPERATIVE HISTORY AND PHYSICAL EXAM ? ?PCP:  [name], PA ?Subjective:  ? ?HPI:  Kendra [name] is a 31 y.o. G2P1011.  Patient's last menstrual period was 11/09/2021 (exact date).  She presents today for a pre-op discussion and PE. ? ?She has the following symptoms: She desires permanent sterilization.  She has previously been counseled in detail regarding long-acting methods of contraception and she has specifically declined these. ? ?Review of Systems:  ? ?Constitutional: Denied constitutional symptoms, night sweats, recent illness, fatigue, fever, insomnia and weight loss.  ?Eyes: Denied eye symptoms, eye pain, photophobia, vision change and visual disturbance.  ?Ears/Nose/Throat/Neck: Denied ear, nose, throat or neck symptoms, hearing loss, nasal discharge, sinus congestion and sore throat.  ?Cardiovascular: Denied cardiovascular symptoms, arrhythmia, chest pain/pressure, edema, exercise intolerance, orthopnea and palpitations.  ?Respiratory: Denied pulmonary symptoms, asthma, pleuritic pain, productive sputum, cough, dyspnea and wheezing.  ?Gastrointestinal: Denied, gastro-esophageal reflux, melena, nausea and vomiting.  ?Genitourinary: Denied genitourinary symptoms including symptomatic vaginal discharge, pelvic relaxation issues, and urinary complaints.  ?Musculoskeletal: Denied musculoskeletal symptoms, stiffness, swelling, muscle weakness and myalgia.  ?Dermatologic: Denied dermatology symptoms, rash and scar.  ?Neurologic: Denied neurology symptoms, dizziness, headache, neck pain and syncope.  ?Psychiatric: Denied psychiatric symptoms, anxiety and depression.  ?Endocrine: Denied endocrine symptoms including hot flashes and night sweats.  ? ?OB History  ?Gravida Para Term Preterm AB Living  ?2 1 1   1 1   ?SAB IAB Ectopic Multiple Live Births  ?        1  ?  ?# Outcome Date GA Lbr Len/2nd Weight Sex Delivery Anes PTL Lv  ?2 Term 11/12/14 [redacted]w[redacted]d  10 lb 2 oz (4.593 kg) F Vag-Spont EPI N  LIV  ?1 AB         FD  ? ? ?Past Medical History:  ?Diagnosis Date  ? Anxiety   ? Depression   ? Headache   ? stress  ? History of cannabis abuse   ? Seizures ([place])   ? age 63. syncopal episode. Hit head. had seizure.   ? Tobacco abuse   ? Vitamin D deficiency   ? ? ?Past Surgical History:  ?Procedure Laterality Date  ? COLON SURGERY  2013  ? pt states "colonoscopy while awake"  ? COLONOSCOPY WITH PROPOFOL N/A 12/14/2015  ? Procedure: COLONOSCOPY WITH PROPOFOL;  Surgeon: Lucilla Lame, MD;  Location: [place];  Service: Endoscopy;  Laterality: N/A;  ? ESOPHAGOGASTRODUODENOSCOPY (EGD) WITH PROPOFOL N/A 12/14/2015  ? Procedure: ESOPHAGOGASTRODUODENOSCOPY (EGD) WITH PROPOFOL;  Surgeon: Lucilla Lame, MD;  Location: [place];  Service: Endoscopy;  Laterality: N/A;  ? [place] EXTRACTION  2013  ?   ? ?SOCIAL HISTORY: ? ?Social History  ? ?Tobacco Use  ?Smoking Status Every Day  ? Packs/day: 0.50  ? Years: 8.00  ? Pack years: 4.00  ? Types: Cigarettes  ?Smokeless Tobacco Never  ? ?Social History  ? ?Substance and Sexual Activity  ?Alcohol Use No  ? ? ?Social History  ? ?Substance and Sexual Activity  ?Drug Use Not Currently  ? Types: Marijuana  ? ? ?Family History  ?Problem Relation Age of Onset  ? Diverticulitis Maternal Grandmother   ? Dementia Maternal Grandmother   ? Diabetes Maternal Grandfather   ? Congestive Heart Failure Maternal Grandfather   ? ? ?ALLERGIES:  Penicillins ? ?MEDS: ?  ?Current Outpatient Medications on File Prior to Visit  ?Medication Sig Dispense Refill  ? buprenorphine-naloxone (SUBOXONE) 8-2 mg SUBL  SL tablet Place 1 tablet under the tongue daily. Patient uses 8mg  film bid    ? gabapentin (NEURONTIN) 100 MG capsule Take 100 mg by mouth 3 (three) times daily.    ? methocarbamol (ROBAXIN) 500 MG tablet Take 1 tablet (500 mg total) by mouth every 6 (six) hours as needed. 15 tablet 0  ? norethindrone (MICRONOR) 0.35 MG tablet Take 1 tablet by mouth daily.    ? sertraline (ZOLOFT)  50 MG tablet Take 50 mg by mouth daily.    ? ?No current facility-administered medications on file prior to visit.  ? ? ?No orders of the defined types were placed in this encounter. ?  ? ?Physical examination ?BP 114/79   Pulse 84   Ht 5\' 7"  (1.702 m)   Wt 219 lb 6.4 oz (99.5 kg)   LMP 11/09/2021 (Exact Date)   BMI 34.36 kg/m?  ? ?General NAD, Conversant  ?HEENT Atraumatic; Op clear with mmm.  Normo-cephalic. Pupils reactive. Anicteric sclerae  ?Thyroid/Neck Smooth without nodularity or enlargement. Normal ROM.  Neck Supple.  ?Skin No rashes, lesions or ulceration. Normal palpated skin turgor. No nodularity.  ?Breasts: No masses or discharge.  Symmetric.  No axillary adenopathy.  ?Lungs: Clear to auscultation.No rales or wheezes. Normal Respiratory effort, no retractions.  ?Heart: NSR.  No murmurs or rubs appreciated. No periferal edema  ?Abdomen: Soft.  Non-tender.  No masses.  No HSM. No hernia  ?Extremities: Moves all appropriately.  Normal ROM for age. No lymphadenopathy.  ?Neuro: Oriented to PPT.  Normal mood. Normal affect.  ? ?  Pelvic: Deferred to OR  ? ?Assessment:  ? ?G2P1011 ?Patient Active Problem List  ? Diagnosis Date Noted  ? Noninfectious diarrhea   ? Non-intractable cyclical vomiting with nausea   ? Diarrhea   ? Anxiety and depression 01/19/2015  ? History of cannabis dependence/abuse ([facility]) 01/19/2015  ? Tobacco abuse 01/19/2015  ? ? ?1. Pre-op exam   ? ? ? ?Plan:  ? ?Orders: ?No orders of the defined types were placed in this encounter. ?  ? ?1.  Laparoscopic Filshie clip permanent sterilization ?

## 2021-11-13 NOTE — Progress Notes (Signed)
Patient presents today for pre-op BTL exam. She states she is looking forward to this procedure. Patient states no other questions or concerns at this time.  ?

## 2021-11-13 NOTE — Progress Notes (Signed)
? ?    PRE-OPERATIVE HISTORY AND PHYSICAL EXAM ? ?PCP:  Kendra Patella, PA ?Subjective:  ? ?HPI:  [name] is a 31 y.o. G2P1011.  Patient's last menstrual period was 11/09/2021 (exact date).  She presents today for a pre-op discussion and PE. ? ?She has the following symptoms: She desires permanent sterilization.  She has previously been counseled in detail regarding long-acting methods of contraception and she has specifically declined these. ? ?Review of Systems:  ? ?Constitutional: Denied constitutional symptoms, night sweats, recent illness, fatigue, fever, insomnia and weight loss.  ?Eyes: Denied eye symptoms, eye pain, photophobia, vision change and visual disturbance.  ?Ears/Nose/Throat/Neck: Denied ear, nose, throat or neck symptoms, hearing loss, nasal discharge, sinus congestion and sore throat.  ?Cardiovascular: Denied cardiovascular symptoms, arrhythmia, chest pain/pressure, edema, exercise intolerance, orthopnea and palpitations.  ?Respiratory: Denied pulmonary symptoms, asthma, pleuritic pain, productive sputum, cough, dyspnea and wheezing.  ?Gastrointestinal: Denied, gastro-esophageal reflux, melena, nausea and vomiting.  ?Genitourinary: Denied genitourinary symptoms including symptomatic vaginal discharge, pelvic relaxation issues, and urinary complaints.  ?Musculoskeletal: Denied musculoskeletal symptoms, stiffness, swelling, muscle weakness and myalgia.  ?Dermatologic: Denied dermatology symptoms, rash and scar.  ?Neurologic: Denied neurology symptoms, dizziness, headache, neck pain and syncope.  ?Psychiatric: Denied psychiatric symptoms, anxiety and depression.  ?Endocrine: Denied endocrine symptoms including hot flashes and night sweats.  ? ?OB History  ?Gravida Para Term Preterm AB Living  ?2 1 1   1 1   ?SAB IAB Ectopic Multiple Live Births  ?        1  ?  ?# Outcome Date GA Lbr Len/2nd Weight Sex Delivery Anes PTL Lv  ?2 Term 11/12/14 [redacted]w[redacted]d  10 lb 2 oz (4.593 kg) F Vag-Spont EPI N  LIV  ?1 AB         FD  ? ? ?Past Medical History:  ?Diagnosis Date  ? Anxiety   ? Depression   ? Headache   ? stress  ? History of cannabis abuse   ? Seizures ([place])   ? age 63. syncopal episode. Hit head. had seizure.   ? Tobacco abuse   ? Vitamin D deficiency   ? ? ?Past Surgical History:  ?Procedure Laterality Date  ? COLON SURGERY  2013  ? pt states "colonoscopy while awake"  ? COLONOSCOPY WITH PROPOFOL N/A 12/14/2015  ? Procedure: COLONOSCOPY WITH PROPOFOL;  Surgeon: Lucilla Lame, MD;  Location: [place];  Service: Endoscopy;  Laterality: N/A;  ? ESOPHAGOGASTRODUODENOSCOPY (EGD) WITH PROPOFOL N/A 12/14/2015  ? Procedure: ESOPHAGOGASTRODUODENOSCOPY (EGD) WITH PROPOFOL;  Surgeon: Lucilla Lame, MD;  Location: [place];  Service: Endoscopy;  Laterality: N/A;  ? [place] EXTRACTION  2013  ?   ? ?SOCIAL HISTORY: ? ?Social History  ? ?Tobacco Use  ?Smoking Status Every Day  ? Packs/day: 0.50  ? Years: 8.00  ? Pack years: 4.00  ? Types: Cigarettes  ?Smokeless Tobacco Never  ? ?Social History  ? ?Substance and Sexual Activity  ?Alcohol Use No  ? ? ?Social History  ? ?Substance and Sexual Activity  ?Drug Use Not Currently  ? Types: Marijuana  ? ? ?Family History  ?Problem Relation Age of Onset  ? Diverticulitis Maternal Grandmother   ? Dementia Maternal Grandmother   ? Diabetes Maternal Grandfather   ? Congestive Heart Failure Maternal Grandfather   ? ? ?ALLERGIES:  Penicillins ? ?MEDS: ?  ?Current Outpatient Medications on File Prior to Visit  ?Medication Sig Dispense Refill  ? buprenorphine-naloxone (SUBOXONE) 8-2 mg SUBL  SL tablet Place 1 tablet under the tongue daily. Patient uses 8mg  film bid    ? gabapentin (NEURONTIN) 100 MG capsule Take 100 mg by mouth 3 (three) times daily.    ? methocarbamol (ROBAXIN) 500 MG tablet Take 1 tablet (500 mg total) by mouth every 6 (six) hours as needed. 15 tablet 0  ? norethindrone (MICRONOR) 0.35 MG tablet Take 1 tablet by mouth daily.    ? sertraline (ZOLOFT)  50 MG tablet Take 50 mg by mouth daily.    ? ?No current facility-administered medications on file prior to visit.  ? ? ?No orders of the defined types were placed in this encounter. ?  ? ?Physical examination ?BP 114/79   Pulse 84   Ht 5\' 7"  (1.702 m)   Wt 219 lb 6.4 oz (99.5 kg)   LMP 11/09/2021 (Exact Date)   BMI 34.36 kg/m?  ? ?General NAD, Conversant  ?HEENT Atraumatic; Op clear with mmm.  Normo-cephalic. Pupils reactive. Anicteric sclerae  ?Thyroid/Neck Smooth without nodularity or enlargement. Normal ROM.  Neck Supple.  ?Skin No rashes, lesions or ulceration. Normal palpated skin turgor. No nodularity.  ?Breasts: No masses or discharge.  Symmetric.  No axillary adenopathy.  ?Lungs: Clear to auscultation.No rales or wheezes. Normal Respiratory effort, no retractions.  ?Heart: NSR.  No murmurs or rubs appreciated. No periferal edema  ?Abdomen: Soft.  Non-tender.  No masses.  No HSM. No hernia  ?Extremities: Moves all appropriately.  Normal ROM for age. No lymphadenopathy.  ?Neuro: Oriented to PPT.  Normal mood. Normal affect.  ? ?  Pelvic: Deferred to OR  ? ?Assessment:  ? ?G2P1011 ?Patient Active Problem List  ? Diagnosis Date Noted  ? Noninfectious diarrhea   ? Non-intractable cyclical vomiting with nausea   ? Diarrhea   ? Anxiety and depression 01/19/2015  ? History of cannabis dependence/abuse ([place]) 01/19/2015  ? Tobacco abuse 01/19/2015  ? ? ?1. Pre-op exam   ? ? ? ?Plan:  ? ?Orders: ?No orders of the defined types were placed in this encounter. ?  ? ?1.  Laparoscopic Filshie clip permanent sterilization ? ?Pre-op discussions regarding Risks and Benefits of her scheduled surgery. ? ?[place] ?We have discussed permanent sterilization in detail.  I have reviewed Filshie clip placement as a means of sterilization.  I have explained the risks and benefits of this procedure in detail.  I have stressed the fact that this is a permanent and not reversible procedure and there is a failure rate of 3 to7 per 1000  which is somewhat timing and method dependent.  I have discussed the possibility of inadvertent damage to bowel, bladder, blood vessels or other internal organs..  I have specifically discussed the risk of anesthesia, infection and blood loss with her.  We have discussed the possibility of laparotomy should there be a problem and the additional possibility of a longer hospital stay.  I have spoken with her regarding the recovery period, informing her that after this procedure, most people are performing their normal daily activities within one week.  I have recommended abstinence or another birth control method for 2-4 weeks following this procedure.  Other options of birth control have also been discussed.  The procedure of sterilization and alternate methods of birth control were discussed in detail with the patient.  I have answered all her questions and I believe that she has an adequate and informed understanding of the procedure. ? ?I spent 31 minutes involved in the care of this patient  preparing to see the patient by obtaining and reviewing her medical history (including labs, imaging tests and prior procedures), documenting clinical information in the electronic health record (EHR), counseling and coordinating care plans, writing and sending prescriptions, ordering tests or procedures and in direct communicating with the patient and medical staff discussing pertinent items from her history and physical exam. ? ?Finis Bud, M.D. ?11/13/2021 ?10:04 AM ? ? ?

## 2021-11-15 ENCOUNTER — Emergency Department: Payer: Medicaid Other

## 2021-11-15 ENCOUNTER — Encounter: Payer: Self-pay | Admitting: Radiology

## 2021-11-15 ENCOUNTER — Other Ambulatory Visit: Payer: Self-pay

## 2021-11-15 ENCOUNTER — Emergency Department
Admission: EM | Admit: 2021-11-15 | Discharge: 2021-11-15 | Disposition: A | Discharge status: Home / Self Care | Payer: Medicaid Other | Attending: Emergency Medicine | Admitting: Emergency Medicine

## 2021-11-15 DIAGNOSIS — R42 Dizziness and giddiness: Secondary | ICD-10-CM | POA: Diagnosis present

## 2021-11-15 DIAGNOSIS — R1013 Epigastric pain: Secondary | ICD-10-CM | POA: Insufficient documentation

## 2021-11-15 DIAGNOSIS — R0789 Other chest pain: Secondary | ICD-10-CM | POA: Diagnosis not present

## 2021-11-15 DIAGNOSIS — Z720 Tobacco use: Secondary | ICD-10-CM | POA: Diagnosis not present

## 2021-11-15 LAB — COMPREHENSIVE METABOLIC PANEL
ALT: 14 U/L (ref 0–44)
AST: 16 U/L (ref 15–41)
Albumin: 4.1 g/dL (ref 3.5–5.0)
Alkaline Phosphatase: 53 U/L (ref 38–126)
Anion gap: 8 (ref 5–15)
BUN: 9 mg/dL (ref 6–20)
CO2: 28 mmol/L (ref 22–32)
Calcium: 9.5 mg/dL (ref 8.9–10.3)
Chloride: 103 mmol/L (ref 98–111)
Creatinine, Ser: 0.71 mg/dL (ref 0.44–1.00)
GFR, Estimated: >60 mL/min (ref >60)
Glucose, Bld: 107 mg/dL — ABNORMAL HIGH (ref 70–99)
Potassium: 4.1 mmol/L (ref 3.5–5.1)
Sodium: 139 mmol/L (ref 135–145)
Total Bilirubin: 0.8 mg/dL (ref 0.3–1.2)
Total Protein: 7.4 g/dL (ref 6.5–8.1)

## 2021-11-15 LAB — CBC
HCT: 43.3 % (ref 36.0–46.0)
Hemoglobin: 14.5 g/dL (ref 12.0–15.0)
MCH: 33.6 pg (ref 26.0–34.0)
MCHC: 33.5 g/dL (ref 30.0–36.0)
MCV: 100.2 fL — ABNORMAL HIGH (ref 80.0–100.0)
Platelets: 299 10*3/uL (ref 150–400)
RBC: 4.32 MIL/uL (ref 3.87–5.11)
RDW: 11.9 % (ref 11.5–15.5)
WBC: 8.4 10*3/uL (ref 4.0–10.5)
nRBC: 0 % (ref 0.0–0.2)

## 2021-11-15 LAB — URINALYSIS, ROUTINE W REFLEX MICROSCOPIC
Bilirubin Urine: NEGATIVE
Glucose, UA: NEGATIVE mg/dL
Hgb urine dipstick: NEGATIVE
Ketones, ur: NEGATIVE mg/dL
Leukocytes,Ua: NEGATIVE
Nitrite: NEGATIVE
Protein, ur: NEGATIVE mg/dL
Specific Gravity, Urine: 1.02 (ref 1.005–1.030)
pH: 7 (ref 5.0–8.0)

## 2021-11-15 LAB — TROPONIN I (HIGH SENSITIVITY)
Troponin I (High Sensitivity): 6 ng/L (ref <18)
Troponin I (High Sensitivity): 3 ng/L (ref <18)

## 2021-11-15 LAB — LIPASE, BLOOD: Lipase: 27 U/L (ref 11–51)

## 2021-11-15 LAB — POC URINE PREG, ED: Preg Test, Ur: NEGATIVE

## 2021-11-15 MED ORDER — IOHEXOL 350 MG/ML SOLN
80.0000 mL | Freq: Once | INTRAVENOUS | Status: AC | PRN
  Administered 2021-11-15: 80 mL via INTRAVENOUS

## 2021-11-15 NOTE — ED Provider Notes (Signed)
? ?[facility] ?Provider Note ? ? ? Event Date/Time  ? First MD Initiated Contact with Patient 11/15/21 1330   ?  (approximate) ? ? ?History  ? ?Abdominal Pain and Chest Pain ? ? ?HPI ? ?Kendra [name] is a 31 y.o. female with history of anxiety, depression, cyclic vomiting, seizures and tobacco use presents emergency department complaining of some dizziness, chest pain and upper epigastric pain.  Patient is worried she has had a heart attack.  Patient was recently diagnosed with vitamin D deficiency and is taking vitamin D once weekly.  She denies any fever or chills.  No cough or congestion.  Patient has been seen by cardiology at [facility] and examined for POTS.  States she was put on the tilt table.  All results were negative. ? ?  ? ? ?Physical Exam  ? ?Triage Vital Signs: ?ED Triage Vitals [11/15/21 1254]  ?Enc Vitals Group  ?   BP 127/90  ?   Pulse Rate 96  ?   Resp 18  ?   Temp 98.2 ?F (36.8 ?C)  ?   Temp Source Oral  ?   SpO2 96 %  ?   Weight 219 lb (99.3 kg)  ?   Height 5\' 7"  (1.702 m)  ?   Head Circumference   ?   Peak Flow   ?   Pain Score 7  ?   Pain Loc   ?   Pain Edu?   ?   Excl. in [place]?   ? ? ?Most recent vital signs: ?Vitals:  ? 11/15/21 1254 11/15/21 1445  ?BP: 127/90 123/78  ?Pulse: 96   ?Resp: 18   ?Temp: 98.2 ?F (36.8 ?C)   ?SpO2: 96%   ? ? ? ?General: Awake, no distress.   ?CV:  Good peripheral perfusion. regular rate and  rhythm ?Resp:  Normal effort. Lungs CTA ?Abd:  No distention.   ?Other:    ? ? ?ED Results / Procedures / Treatments  ? ?Labs ?(all labs ordered are listed, but only abnormal results are displayed) ?Labs Reviewed  ?COMPREHENSIVE METABOLIC PANEL - Abnormal; Notable for the following components:  ?    Result Value  ? Glucose, Bld 107 (*)   ? All other components within normal limits  ?CBC - Abnormal; Notable for the following components:  ? MCV 100.2 (*)   ? All other components within normal limits  ?URINALYSIS, ROUTINE W REFLEX MICROSCOPIC -  Abnormal; Notable for the following components:  ? Color, Urine YELLOW (*)   ? APPearance HAZY (*)   ? All other components within normal limits  ?LIPASE, BLOOD  ?POC URINE PREG, ED  ?TROPONIN I (HIGH SENSITIVITY)  ?TROPONIN I (HIGH SENSITIVITY)  ? ? ? ?EKG ? ?EKG ? ? ?RADIOLOGY ?Chest x-ray, CT of the head, CT for PE ? ? ? ?PROCEDURES: ? ? ?Procedures ? ? ?MEDICATIONS ORDERED IN ED: ?Medications  ?iohexol (OMNIPAQUE) 350 MG/ML injection 80 mL (80 mLs Intravenous Contrast Given 11/15/21 1521)  ? ? ? ?IMPRESSION / MDM / ASSESSMENT AND PLAN / ED COURSE  ?I reviewed the triage vital signs and the nursing notes. ?             ?               ? ?Differential diagnosis includes, but is not limited to, CVA, subdural, SAH, tumor, PE, MI, dehydration ? ?The patient's labs are all reassuring, CBC, metabolic panel, lipase, POC pregnancy and  urinalysis are all normal, troponin 1 and 2 are both normal. ? ?Chest x-ray was independently reviewed by me and do not see any acute abnormality. ? ?CT of the head due to the dizziness was reviewed by me independently.  Confirmed by radiology to be negative ? ?CT of the chest for PE was independently reviewed by me.  Read as negative for PE or other acute abnormality by radiology. ? ?Patient's orthostatics were normal. ? ?I did explain all the findings to the patient.  Explained to her there is nothing serious today.  She will follow-up with her regular doctors.  She can follow-up with cardiology concerning the chest pain and dizziness.  I did speak with the patient's mother via phone also.  I did explain all results to her with patient's permission.  Patient is in agreement treatment plan.  She was discharged stable condition. ? ? ? ? ?  ? ? ?FINAL CLINICAL IMPRESSION(S) / ED DIAGNOSES  ? ?Final diagnoses:  ?Dizziness  ?Other chest pain  ? ? ? ?Rx / DC Orders  ? ?ED Discharge Orders   ? ? None  ? ?  ? ? ? ?Note:  This document was prepared using Dragon voice recognition software and may  include unintentional dictation errors. ? ?  ?Versie Starks, PA-C ?11/15/21 1627 ? ?  ?Nance Pear, MD ?11/16/21 1500 ? ?

## 2021-11-15 NOTE — ED Triage Notes (Signed)
Pt here with right abd and flank pain. Pt also having cp. Pt denies n/v.  ?

## 2021-11-18 ENCOUNTER — Emergency Department
Admission: EM | Admit: 2021-11-18 | Discharge: 2021-11-18 | Disposition: A | Discharge status: Home / Self Care | Payer: Medicaid Other | Attending: Emergency Medicine | Admitting: Emergency Medicine

## 2021-11-18 DIAGNOSIS — R300 Dysuria: Secondary | ICD-10-CM | POA: Diagnosis not present

## 2021-11-18 DIAGNOSIS — K2100 Gastro-esophageal reflux disease with esophagitis, without bleeding: Secondary | ICD-10-CM | POA: Diagnosis not present

## 2021-11-18 DIAGNOSIS — R55 Syncope and collapse: Secondary | ICD-10-CM

## 2021-11-18 DIAGNOSIS — R1012 Left upper quadrant pain: Secondary | ICD-10-CM | POA: Diagnosis present

## 2021-11-18 LAB — POC URINE PREG, ED: Preg Test, Ur: NEGATIVE

## 2021-11-18 LAB — URINALYSIS, ROUTINE W REFLEX MICROSCOPIC
Bacteria, UA: NONE SEEN
Bilirubin Urine: NEGATIVE
Glucose, UA: NEGATIVE mg/dL
Hgb urine dipstick: NEGATIVE
Ketones, ur: NEGATIVE mg/dL
Leukocytes,Ua: NEGATIVE
Nitrite: NEGATIVE
Protein, ur: 30 mg/dL — AB
Specific Gravity, Urine: 1.029 (ref 1.005–1.030)
pH: 5 (ref 5.0–8.0)

## 2021-11-18 LAB — COMPREHENSIVE METABOLIC PANEL
ALT: 16 U/L (ref 0–44)
AST: 17 U/L (ref 15–41)
Albumin: 4.3 g/dL (ref 3.5–5.0)
Alkaline Phosphatase: 55 U/L (ref 38–126)
Anion gap: 5 (ref 5–15)
BUN: 9 mg/dL (ref 6–20)
CO2: 29 mmol/L (ref 22–32)
Calcium: 9.7 mg/dL (ref 8.9–10.3)
Chloride: 104 mmol/L (ref 98–111)
Creatinine, Ser: 0.85 mg/dL (ref 0.44–1.00)
GFR, Estimated: >60 mL/min (ref >60)
Glucose, Bld: 108 mg/dL — ABNORMAL HIGH (ref 70–99)
Potassium: 3.9 mmol/L (ref 3.5–5.1)
Sodium: 138 mmol/L (ref 135–145)
Total Bilirubin: 1 mg/dL (ref 0.3–1.2)
Total Protein: 7.9 g/dL (ref 6.5–8.1)

## 2021-11-18 LAB — CBC
HCT: 44.2 % (ref 36.0–46.0)
Hemoglobin: 14.9 g/dL (ref 12.0–15.0)
MCH: 33.3 pg (ref 26.0–34.0)
MCHC: 33.7 g/dL (ref 30.0–36.0)
MCV: 98.9 fL (ref 80.0–100.0)
Platelets: 313 10*3/uL (ref 150–400)
RBC: 4.47 MIL/uL (ref 3.87–5.11)
RDW: 11.9 % (ref 11.5–15.5)
WBC: 7.2 10*3/uL (ref 4.0–10.5)
nRBC: 0 % (ref 0.0–0.2)

## 2021-11-18 LAB — LIPASE, BLOOD: Lipase: 28 U/L (ref 11–51)

## 2021-11-18 LAB — TROPONIN I (HIGH SENSITIVITY): Troponin I (High Sensitivity): 4 ng/L (ref <18)

## 2021-11-18 MED ORDER — LIDOCAINE VISCOUS HCL 2 % MT SOLN
15.0000 mL | Freq: Once | OROMUCOSAL | Status: AC
Start: 2021-11-18 — End: 2021-11-18
  Administered 2021-11-18: 15 mL via ORAL
  Filled 2021-11-18: qty 15

## 2021-11-18 MED ORDER — OMEPRAZOLE MAGNESIUM 20 MG PO TBEC: 20.0000 mg | DELAYED_RELEASE_TABLET | Freq: Every day | ORAL | 1 refills | Status: DC

## 2021-11-18 MED ORDER — SODIUM CHLORIDE 0.9 % IV BOLUS
1000.0000 mL | Freq: Once | INTRAVENOUS | Status: AC
  Administered 2021-11-18: 1000 mL via INTRAVENOUS

## 2021-11-18 MED ORDER — ONDANSETRON HCL 4 MG/2ML IJ SOLN
4.0000 mg | Freq: Once | INTRAMUSCULAR | Status: DC
Start: 2021-11-18 — End: 2021-11-18

## 2021-11-18 MED ORDER — ALUM & MAG HYDROXIDE-SIMETH 200-200-20 MG/5ML PO SUSP
15.0000 mL | Freq: Once | ORAL | Status: AC
Start: 2021-11-18 — End: 2021-11-18
  Administered 2021-11-18: 15 mL via ORAL
  Filled 2021-11-18: qty 30

## 2021-11-18 NOTE — ED Triage Notes (Signed)
Pt comes pov with left lower quadrant burning pain. Was seen here recently for similar pain but states now it's on her left side only. States she feels lightheaded from the pain sometimes. A little pain with urination.  ?

## 2021-11-18 NOTE — ED Provider Notes (Signed)
? ?[facility] ?Provider Note ? ? ? Event Date/Time  ? First MD Initiated Contact with Patient 11/18/21 1506   ?  (approximate) ? ? ?History  ? ?Chief Complaint ?Abdominal Pain and Near Syncope ? ? ?HPI ? ?Kendra [name] is a 31 y.o. female with past medical history of cyclic vomiting and syncope who presents to the ED complaining of abdominal pain and syncope.  Patient reports that she has been dealing with intermittent pain in the left upper quadrant of her abdomen for the past couple of days.  She describes this pain as a burning that is not exacerbated or alleviated by anything.  It is been associated with nausea but she has not vomited and denies any changes in her bowel movements.  Pain will occasionally move up into the center of her chest, but she denies any cough or difficulty breathing.  She does state that she has had some mild dysuria, denies any hematuria, vaginal bleeding, discharge, flank pain, or fever.  She has been feeling increasingly lightheaded, particularly when the pain is severe.  She states she had an episode earlier this afternoon where she passed out, did not fall to the ground or hit her head.  She has passed out previously and been seen by cardiology for this with unremarkable work-up, states she has been diagnosed with vasovagal episodes that come on with pain.  She was seen in the ED 3 days ago for similar lightheadedness and dizziness, had CT of her head as well as CTa of her chest that was negative at that time. ?  ? ? ?Physical Exam  ? ?Triage Vital Signs: ?ED Triage Vitals  ?Enc Vitals Group  ?   BP 11/18/21 1449 121/71  ?   Pulse Rate 11/18/21 1449 (!) 101  ?   Resp 11/18/21 1449 18  ?   Temp 11/18/21 1449 98.2 ?F (36.8 ?C)  ?   Temp Source 11/18/21 1449 Oral  ?   SpO2 11/18/21 1449 100 %  ?   Weight --   ?   Height --   ?   Head Circumference --   ?   Peak Flow --   ?   Pain Score 11/18/21 1448 8  ?   Pain Loc --   ?   Pain Edu? --   ?   Excl. in [place]? --    ? ? ?Most recent vital signs: ?Vitals:  ? 11/18/21 1600 11/18/21 1630  ?BP: 116/85 117/76  ?Pulse: 76 79  ?Resp: 19 15  ?Temp:    ?SpO2: 98% 100%  ? ? ?Constitutional: Alert and oriented. ?Eyes: Conjunctivae are normal. ?Head: Atraumatic. ?Nose: No congestion/rhinnorhea. ?Mouth/Throat: Mucous membranes are moist.  ?Cardiovascular: Normal rate, regular rhythm. Grossly normal heart sounds.  2+ radial pulses bilaterally. ?Respiratory: Normal respiratory effort.  No retractions. Lungs CTAB. ?Gastrointestinal: Soft and nontender. No distention. ?Musculoskeletal: No lower extremity tenderness nor edema.  ?Neurologic:  Normal speech and language. No gross focal neurologic deficits are appreciated. ? ? ? ?ED Results / Procedures / Treatments  ? ?Labs ?(all labs ordered are listed, but only abnormal results are displayed) ?Labs Reviewed  ?COMPREHENSIVE METABOLIC PANEL - Abnormal; Notable for the following components:  ?    Result Value  ? Glucose, Bld 108 (*)   ? All other components within normal limits  ?URINALYSIS, ROUTINE W REFLEX MICROSCOPIC - Abnormal; Notable for the following components:  ? Color, Urine YELLOW (*)   ? APPearance CLOUDY (*)   ?  Protein, ur 30 (*)   ? All other components within normal limits  ?LIPASE, BLOOD  ?CBC  ?POC URINE PREG, ED  ?TROPONIN I (HIGH SENSITIVITY)  ? ? ? ?EKG ? ?ED ECG REPORT ?Tempie Hoist, the attending physician, personally viewed and interpreted this ECG. ? ? Date: 11/18/2021 ? EKG Time: 15:59 ? Rate: 76 ? Rhythm: normal sinus rhythm ? Axis: Normal ? Intervals:none ? ST&T Change: None ? ? ?PROCEDURES: ? ?Critical Care performed: No ? ?.1-3 Lead EKG Interpretation ?Performed by: Blake Divine, MD ?Authorized by: Blake Divine, MD  ? ?  Interpretation: normal   ?  ECG rate:  75-90 ?  ECG rate assessment: normal   ?  Rhythm: sinus rhythm   ?  Ectopy: none   ?  Conduction: normal   ? ? ?MEDICATIONS ORDERED IN ED: ?Medications  ?ondansetron (ZOFRAN) injection 4 mg (4 mg  Intravenous Patient Refused/Not Given 11/18/21 1603)  ?sodium chloride 0.9 % bolus 1,000 mL (1,000 mLs Intravenous New Bag/Given 11/18/21 1555)  ?alum & mag hydroxide-simeth (MAALOX/MYLANTA) 200-200-20 MG/5ML suspension 15 mL (15 mLs Oral Given 11/18/21 1556)  ?  And  ?lidocaine (XYLOCAINE) 2 % viscous mouth solution 15 mL (15 mLs Oral Given 11/18/21 1556)  ? ? ? ?IMPRESSION / MDM / ASSESSMENT AND PLAN / ED COURSE  ?I reviewed the triage vital signs and the nursing notes. ?             ?               ? ?31 y.o. female with past medical history of cyclic vomiting and syncope who presents to the ED complaining of intermittent left upper quadrant burning pain that will occasionally move up into her chest for the past couple of days, has been associated with lightheadedness and she had a syncopal episode earlier today. ? ?Differential diagnosis includes, but is not limited to, ACS, arrhythmia, PE, vasovagal episode, gastritis, GERD, pancreatitis, hepatitis, biliary colic, cholecystitis, UTI, kidney stone. ? ?Patient is nontoxic-appearing and in no acute distress, vital signs are unremarkable and EKG shows no evidence of arrhythmia or ischemia.  Patient has a benign abdominal exam and symptoms sound most consistent with gastritis and element of GERD.  We will observe on cardiac monitor and screen troponin, but doubt ACS or PE, especially given her negative CTA 3 days ago.  Given benign abdominal exam, I do not feel CT imaging of her abdomen is indicated.  Labs are reassuring with LFTs and lipase within normal limits, CBC without anemia or leukocytosis, BMP without electrolyte abnormality or AKI.  Pregnancy testing is negative and UA shows no signs of infection.  We will hydrate with IV fluids, treat symptomatically with IV Zofran and GI cocktail. ? ?Troponin within normal limits and no events noted on cardiac monitor.  Patient reports partial improvement in symptoms following GI cocktail and suspect symptoms are related to  gastritis and GERD.  She is appropriate for discharge home with PCP follow-up, will be PPI and was counseled to use H2 blocker as needed.  She was counseled to return to the ED for new or worsening symptoms, patient agrees with plan. ? ?The patient is on the cardiac monitor to evaluate for evidence of arrhythmia and/or significant heart rate changes. ? ?  ? ? ?FINAL CLINICAL IMPRESSION(S) / ED DIAGNOSES  ? ?Final diagnoses:  ?LUQ abdominal pain  ?Gastroesophageal reflux disease with esophagitis without hemorrhage  ?Syncope and collapse  ? ? ? ?Rx / DC Orders  ? ?  ED Discharge Orders   ? ?      Ordered  ?  omeprazole (PRILOSEC OTC) 20 MG tablet  Daily       ? 11/18/21 1637  ? ?  ?  ? ?  ? ? ? ?Note:  This document was prepared using Dragon voice recognition software and may include unintentional dictation errors. ?  ?Blake Divine, MD ?11/18/21 1640 ? ?

## 2021-11-19 ENCOUNTER — Encounter: Payer: Self-pay | Admitting: Obstetrics and Gynecology

## 2021-11-23 ENCOUNTER — Ambulatory Visit (INDEPENDENT_AMBULATORY_CARE_PROVIDER_SITE_OTHER): Payer: Medicaid Other | Admitting: Obstetrics and Gynecology

## 2021-11-23 ENCOUNTER — Other Ambulatory Visit (HOSPITAL_COMMUNITY)
Admission: RE | Admit: 2021-11-23 | Discharge: 2021-11-23 | Disposition: A | Discharge status: Home / Self Care | Payer: Medicaid Other | Source: Ambulatory Visit | Attending: Obstetrics and Gynecology | Admitting: Obstetrics and Gynecology

## 2021-11-23 ENCOUNTER — Encounter: Payer: Self-pay | Admitting: Obstetrics and Gynecology

## 2021-11-23 VITALS — BP 121/80 | HR 94 | Ht 67.0 in | Wt 222.4 lb

## 2021-11-23 DIAGNOSIS — N898 Other specified noninflammatory disorders of vagina: Secondary | ICD-10-CM

## 2021-11-23 DIAGNOSIS — L989 Disorder of the skin and subcutaneous tissue, unspecified: Secondary | ICD-10-CM

## 2021-11-23 NOTE — Progress Notes (Signed)
Patient presents today to discuss vaginal bumps that she noticed 2-3 days ago.  States itching and a slight odor as well. She states noticing them in the shower on the outside and edges of labia. She reports bumps have a white head, she tried to pop but noticed it was very painful and stopped. No new sexual partners and all recent std testing was negative. Patient states no other questions or concerns at this time.  ?

## 2021-11-23 NOTE — Progress Notes (Signed)
HPI: ?     [name] is a 31 y.o. G2P1011 who LMP was Patient's last menstrual period was 11/09/2021 (exact date). ? ?Subjective:  ? ?She presents today stating that 3 days ago she had some "bumps" on her labia which were itching with urination and had some burning with urination.  Otherwise they were asymptomatic. ?She reports no new sexual partners. ?She recently had GC and chlamydia as well as HPV testing and it was negative. ?She denies a history of HSV. ?She does report an increased white vaginal discharge. ? ?  Hx: ?The following portions of the patient's history were reviewed and updated as appropriate: ?            She  has a past medical history of Anxiety, Depression, Headache, History of cannabis abuse, Seizures (HCC), Tobacco abuse, and Vitamin D deficiency. ?She does not have any pertinent problems on file. ?She  has a past surgical history that includes Wisdom tooth extraction (2013); Colon surgery (2013); Colonoscopy with propofol (N/A, 12/14/2015); and Esophagogastroduodenoscopy (egd) with propofol (N/A, 12/14/2015). ?Her family history includes Congestive Heart Failure in her maternal grandfather; Dementia in her maternal grandmother; Diabetes in her maternal grandfather; Diverticulitis in her maternal grandmother. ?She  reports that she has been smoking cigarettes. She has a 4.00 pack-year smoking history. She has never used smokeless tobacco. She reports that she does not currently use drugs after having used the following drugs: Marijuana. She reports that she does not drink alcohol. ?She has a current medication list which includes the following prescription(s): buprenorphine-naloxone, gabapentin, methocarbamol, norethindrone, omeprazole, and sertraline. ?She has no active allergies. ?      ?Review of Systems:  ?Review of Systems ? ?Constitutional: Denied constitutional symptoms, night sweats, recent illness, fatigue, fever, insomnia and weight loss.  ?Eyes: Denied eye symptoms, eye  pain, photophobia, vision change and visual disturbance.  ?Ears/Nose/Throat/Neck: Denied ear, nose, throat or neck symptoms, hearing loss, nasal discharge, sinus congestion and sore throat.  ?Cardiovascular: Denied cardiovascular symptoms, arrhythmia, chest pain/pressure, edema, exercise intolerance, orthopnea and palpitations.  ?Respiratory: Denied pulmonary symptoms, asthma, pleuritic pain, productive sputum, cough, dyspnea and wheezing.  ?Gastrointestinal: Denied, gastro-esophageal reflux, melena, nausea and vomiting.  ?Genitourinary: See HPI for additional information.  ?Musculoskeletal: Denied musculoskeletal symptoms, stiffness, swelling, muscle weakness and myalgia.  ?Dermatologic: Denied dermatology symptoms, rash and scar.  ?Neurologic: Denied neurology symptoms, dizziness, headache, neck pain and syncope.  ?Psychiatric: Denied psychiatric symptoms, anxiety and depression.  ?Endocrine: Denied endocrine symptoms including hot flashes and night sweats.  ? ?Meds: ?  ?Current Outpatient Medications on File Prior to Visit  ?Medication Sig Dispense Refill  ? buprenorphine-naloxone (SUBOXONE) 8-2 mg SUBL SL tablet Place 1 tablet under the tongue daily. Patient uses 8mg  film bid    ? gabapentin (NEURONTIN) 100 MG capsule Take 100 mg by mouth 3 (three) times daily.    ? methocarbamol (ROBAXIN) 500 MG tablet Take 1 tablet (500 mg total) by mouth every 6 (six) hours as needed. 15 tablet 0  ? norethindrone (MICRONOR) 0.35 MG tablet Take 1 tablet by mouth daily.    ? omeprazole (PRILOSEC OTC) 20 MG tablet Take 1 tablet (20 mg total) by mouth daily. 28 tablet 1  ? sertraline (ZOLOFT) 50 MG tablet Take 50 mg by mouth daily.    ? ?No current facility-administered medications on file prior to visit.  ? ? ? ? ?Objective:  ?  ? ?Vitals:  ? 11/23/21 0845  ?BP: 121/80  ?Pulse: 94  ? ?  Filed Weights  ? 11/23/21 0845  ?Weight: 222 lb 6.4 oz (100.9 kg)  ? ?  ?         Physical examination ?  Pelvic:   ?Vulva: Normal appearance.  No  lesions.  ?Vagina: No lesions or abnormalities noted.  ?Support: Normal pelvic support.  ?Urethra No masses tenderness or scarring.  ?Meatus Normal size without lesions or prolapse.  ?Cervix: Normal appearance.  No lesions.  ?Anus: Normal exam.  No lesions.  ?Perineum: Normal exam.  No lesions.  ? ? ?        ? ?Assessment:  ?  ?G2P1011 ?Patient Active Problem List  ? Diagnosis Date Noted  ? Noninfectious diarrhea   ? Non-intractable cyclical vomiting with nausea   ? Diarrhea   ? Anxiety and depression 01/19/2015  ? History of cannabis dependence/abuse (HCC) 01/19/2015  ? Tobacco abuse 01/19/2015  ? ?  ?1. Itching in the vaginal area   ?2. Bumps on skin   ?3. Vaginal odor   ? ? No evidence of vulvar abnormality at this time. ? ? ?Plan:  ?  ?       ? 1.  Nuswab performed ? 2.  As " bumps" have resolved, nothing further to do.  Expectant management.  If they recur I have asked her to come back in immediately for examination. ? ?Orders ?No orders of the defined types were placed in this encounter. ? ? No orders of the defined types were placed in this encounter. ?  ?  F/U ? Return for Pt to contact us if symptoms worsen, We will contact her with any abnormal test results. ?I spent 22 minutes involved in the care of this patient preparing to see the patient by obtaining and reviewing her medical history (including labs, imaging tests and prior procedures), documenting clinical information in the electronic health record (EHR), counseling and coordinating care plans, writing and sending prescriptions, ordering tests or procedures and in direct communicating with the patient and medical staff discussing pertinent items from her history and physical exam. ? ?Elonda Husky, M.D. ?11/23/2021 ?9:20 AM ? ? ? ? ?

## 2021-11-26 ENCOUNTER — Emergency Department: Payer: Medicaid Other

## 2021-11-26 ENCOUNTER — Other Ambulatory Visit: Payer: Self-pay

## 2021-11-26 ENCOUNTER — Emergency Department
Admission: EM | Admit: 2021-11-26 | Discharge: 2021-11-26 | Disposition: A | Discharge status: Home / Self Care | Payer: Medicaid Other | Attending: Emergency Medicine | Admitting: Emergency Medicine

## 2021-11-26 DIAGNOSIS — R519 Headache, unspecified: Secondary | ICD-10-CM | POA: Insufficient documentation

## 2021-11-26 DIAGNOSIS — M25519 Pain in unspecified shoulder: Secondary | ICD-10-CM | POA: Diagnosis not present

## 2021-11-26 DIAGNOSIS — N83201 Unspecified ovarian cyst, right side: Secondary | ICD-10-CM | POA: Diagnosis not present

## 2021-11-26 DIAGNOSIS — N898 Other specified noninflammatory disorders of vagina: Secondary | ICD-10-CM | POA: Diagnosis not present

## 2021-11-26 DIAGNOSIS — R0781 Pleurodynia: Secondary | ICD-10-CM | POA: Insufficient documentation

## 2021-11-26 DIAGNOSIS — K5641 Fecal impaction: Secondary | ICD-10-CM | POA: Insufficient documentation

## 2021-11-26 DIAGNOSIS — R0789 Other chest pain: Secondary | ICD-10-CM

## 2021-11-26 DIAGNOSIS — R1084 Generalized abdominal pain: Secondary | ICD-10-CM

## 2021-11-26 DIAGNOSIS — R1030 Lower abdominal pain, unspecified: Secondary | ICD-10-CM | POA: Diagnosis present

## 2021-11-26 LAB — HEPATIC FUNCTION PANEL
ALT: 17 U/L (ref 0–44)
AST: 20 U/L (ref 15–41)
Albumin: 4.2 g/dL (ref 3.5–5.0)
Alkaline Phosphatase: 54 U/L (ref 38–126)
Bilirubin, Direct: <0.1 mg/dL (ref 0.0–0.2)
Total Bilirubin: 0.6 mg/dL (ref 0.3–1.2)
Total Protein: 7.9 g/dL (ref 6.5–8.1)

## 2021-11-26 LAB — CBC
HCT: 43.1 % (ref 36.0–46.0)
Hemoglobin: 14.3 g/dL (ref 12.0–15.0)
MCH: 33.5 pg (ref 26.0–34.0)
MCHC: 33.2 g/dL (ref 30.0–36.0)
MCV: 100.9 fL — ABNORMAL HIGH (ref 80.0–100.0)
Platelets: 302 10*3/uL (ref 150–400)
RBC: 4.27 MIL/uL (ref 3.87–5.11)
RDW: 12.2 % (ref 11.5–15.5)
WBC: 8.1 10*3/uL (ref 4.0–10.5)
nRBC: 0 % (ref 0.0–0.2)

## 2021-11-26 LAB — BASIC METABOLIC PANEL WITH GFR
Anion gap: 6 (ref 5–15)
BUN: 11 mg/dL (ref 6–20)
CO2: 27 mmol/L (ref 22–32)
Calcium: 9.5 mg/dL (ref 8.9–10.3)
Chloride: 102 mmol/L (ref 98–111)
Creatinine, Ser: 0.81 mg/dL (ref 0.44–1.00)
GFR, Estimated: >60 mL/min
Glucose, Bld: 101 mg/dL — ABNORMAL HIGH (ref 70–99)
Potassium: 4.1 mmol/L (ref 3.5–5.1)
Sodium: 135 mmol/L (ref 135–145)

## 2021-11-26 LAB — CERVICOVAGINAL ANCILLARY ONLY
Bacterial Vaginitis (gardnerella): NEGATIVE
Candida Glabrata: NEGATIVE
Candida Vaginitis: NEGATIVE
Comment: NEGATIVE
Comment: NEGATIVE
Comment: NEGATIVE

## 2021-11-26 LAB — TROPONIN I (HIGH SENSITIVITY): Troponin I (High Sensitivity): 3 ng/L

## 2021-11-26 LAB — LIPASE, BLOOD: Lipase: 28 U/L (ref 11–51)

## 2021-11-26 LAB — POC URINE PREG, ED: Preg Test, Ur: NEGATIVE

## 2021-11-26 MED ORDER — IOHEXOL 300 MG/ML  SOLN
100.0000 mL | Freq: Once | INTRAMUSCULAR | Status: AC | PRN
  Administered 2021-11-26: 100 mL via INTRAVENOUS

## 2021-11-26 NOTE — ED Triage Notes (Signed)
Pt to ED via POV from home. Pt recently seen for LLQ pain, dizziness and painful urination. Pt reports symptoms have worsened since last seen. Pt states she passed out this morning and hit the back of her head. Pt reports CP r/t left shoulder, HA and blurry vision.  ?

## 2021-11-26 NOTE — ED Provider Notes (Signed)
? ?[facility] ?Provider Note ? ? ? Event Date/Time  ? First MD Initiated Contact with Patient 11/26/21 1213   ?  (approximate) ? ? ?History  ? ?Chest Pain ? ? ?HPI ? ?Kendra [name] is a 31 y.o. female who complains of some chest pain which comes on mostly when she is resting occasionally COVID goes along with shoulder pain.  She also has some lower abdominal pain.  This waxes and wanes mostly comes on when she is resting.  It does not seem to be associated with food.  She has seen primary care for primary care plans to refer her to GI if the Prilosec OTC he gave her is not working.  She has also seen cardiology she reports that he wore a Holter type monitor for a week and also had a stress test and echo.  These were all normal.  Patient is not having any shortness of breath or pleuritic pain.  She did have the pain and passed out today he banged her head gently.  She is having minimal headache no head tenderness no neck tenderness.  Patient had a recent CT head and chest which were negative. ? ?  ? ? ?Physical Exam  ? ?Triage Vital Signs: ?ED Triage Vitals  ?Enc Vitals Group  ?   BP 11/26/21 1150 118/71  ?   Pulse Rate 11/26/21 1150 90  ?   Resp 11/26/21 1150 18  ?   Temp 11/26/21 1150 98.7 ?F (37.1 ?C)  ?   Temp Source 11/26/21 1150 Oral  ?   SpO2 11/26/21 1150 100 %  ?   Weight 11/26/21 1149 222 lb 6.4 oz (100.9 kg)  ?   Height 11/26/21 1149 5\' 7"  (1.702 m)  ?   Head Circumference --   ?   Peak Flow --   ?   Pain Score 11/26/21 1149 9  ?   Pain Loc --   ?   Pain Edu? --   ?   Excl. in [place]? --   ? ? ?Most recent vital signs: ?Vitals:  ? 11/26/21 1150 11/26/21 1233  ?BP: 118/71 123/87  ?Pulse: 90 94  ?Resp: 18   ?Temp: 98.7 ?F (37.1 ?C)   ?SpO2: 100% 100%  ? ? ? ?General: Awake, no distress.  ?Head normocephalic atraumatic ?Eyes pupils equal round extraocular movements intact ?Neck is supple and nontender ?Chest is nontender except for minimally over the lowest rib on the right ?CV:  Good  peripheral perfusion.  Heart regular rate and rhythm no audible murmurs ?Resp:  Normal effort.  Lungs are clear ?Abd:  No distention.  Soft some lower abdominal tenderness to palpation no organomegaly ?Extremities with no edema.  Shoulders not tender to palpation ? ? ?ED Results / Procedures / Treatments  ? ?Labs ?(all labs ordered are listed, but only abnormal results are displayed) ?Labs Reviewed  ?BASIC METABOLIC PANEL - Abnormal; Notable for the following components:  ?    Result Value  ? Glucose, Bld 101 (*)   ? All other components within normal limits  ?CBC - Abnormal; Notable for the following components:  ? MCV 100.9 (*)   ? All other components within normal limits  ?HEPATIC FUNCTION PANEL  ?LIPASE, BLOOD  ?POC URINE PREG, ED  ?TROPONIN I (HIGH SENSITIVITY)  ?TROPONIN I (HIGH SENSITIVITY)  ? ? ? ?EKG ? ?EKG read interpreted by me shows normal sinus rhythm rate of 84 normal axis essentially normal EKG ? ? ?RADIOLOGY ?  Chest x-ray read by radiology films reviewed by me do not show any acute problems ? ? ?PROCEDURES: ? ?Critical Care performed:  ? ?Procedures ? ? ?MEDICATIONS ORDERED IN ED: ?Medications  ?iohexol (OMNIPAQUE) 300 MG/ML solution 100 mL (100 mLs Intravenous Contrast Given 11/26/21 1319)  ? ? ? ?IMPRESSION / MDM / ASSESSMENT AND PLAN / ED COURSE  ?I reviewed the triage vital signs and the nursing notes. ?We will plan to get CT abdomen.  If this is negative I will send her off to her primary care doctor who can then refer her to GI. ?CT abdomen looks negative except for an ovarian cyst and some stool in the colon.  I will have her follow-up with GI.  Also have her take a little bit of laxative over-the-counter see if that helps. ?Patient is having prescription, full evaluation by cardiology.  She has good primary care doctor again by her description.  He was already planning to refer her to GI.  I think this was a good idea.  I will have her follow-up with GI and continue to see him or her.  Patient  can return at any time if she is worse or not improving. ? ? ?Clinical Course as of 11/26/21 1558  ?Mon Nov 26, 2021  ?1339 Albumin: 4.2 [PM]  ?  ?Clinical Course User Index ?[PM] Arnaldo Natal, MD  ? ? ? ?FINAL CLINICAL IMPRESSION(S) / ED DIAGNOSES  ? ?Final diagnoses:  ?Generalized abdominal pain  ?Atypical chest pain  ? ? ? ?Rx / DC Orders  ? ?ED Discharge Orders   ? ? None  ? ?  ? ? ? ?Note:  This document was prepared using Dragon voice recognition software and may include unintentional dictation errors. ?  ?Arnaldo Natal, MD ?11/26/21 1559 ? ?

## 2021-11-26 NOTE — Discharge Instructions (Addendum)
Please continue to follow-up with your primary care doctor.  Also please follow-up with GI.  Dr. Marius Ditch is on-call.  If you give her office a call let her know that you were in the emergency room they should be out of see you fairly quickly.  The CT scan showed a fair amount of stool in the colon.  You could try an over-the-counter laxative once or twice and see if that would help.  Otherwise everything looks pretty good.  Do not hesitate to return though if anything worsens. ?

## 2021-11-27 ENCOUNTER — Encounter: Payer: Self-pay | Admitting: Emergency Medicine

## 2021-11-27 ENCOUNTER — Emergency Department
Admission: EM | Admit: 2021-11-27 | Discharge: 2021-11-27 | Disposition: A | Discharge status: Home / Self Care | Payer: Medicaid Other | Attending: Emergency Medicine | Admitting: Emergency Medicine

## 2021-11-27 ENCOUNTER — Emergency Department: Payer: Medicaid Other

## 2021-11-27 DIAGNOSIS — R0789 Other chest pain: Secondary | ICD-10-CM | POA: Diagnosis present

## 2021-11-27 DIAGNOSIS — Z8616 Personal history of COVID-19: Secondary | ICD-10-CM | POA: Diagnosis not present

## 2021-11-27 DIAGNOSIS — F17200 Nicotine dependence, unspecified, uncomplicated: Secondary | ICD-10-CM | POA: Diagnosis not present

## 2021-11-27 DIAGNOSIS — R0781 Pleurodynia: Secondary | ICD-10-CM | POA: Diagnosis not present

## 2021-11-27 DIAGNOSIS — M25512 Pain in left shoulder: Secondary | ICD-10-CM | POA: Insufficient documentation

## 2021-11-27 NOTE — ED Notes (Signed)
See triage note  presents with left anterior shoulder pain which moves into left arm  states pain wakes her up at night  positive nausea  was seen yesterday for same along with some abd pain  states she has GI appt set up    conts ot have shoulder pain ?

## 2021-11-27 NOTE — ED Provider Notes (Signed)
? ?[facility] ?Provider Note ? ? ? Event Date/Time  ? First MD Initiated Contact with Patient 11/27/21 [phone]   ?  (approximate) ? ? ?History  ? ?Chief Complaint ?Chest Pain and Shoulder Pain ? ? ?HPI ?Kendra [name] is a 31 y.o. female, history of anxiety/depression, tobacco abuse, cannabis use, seizure, presents to the emergency department for evaluation of chest pain/shoulder pain.  Patient states that she has been having ongoing chest pain and left-sided shoulder pain for the past 5 months, particularly after a COVID infection back in December 2022.  She states that the pain is constant, localized to the left side the chest and left shoulder, sharp sensation, worse at night.  She states that she has been evaluated several times for this to the emergency department by specialist, including cardiology.  She has had a echocardiogram done, which was unremarkable.  CT angio was negative for pulmonary embolism or other acute pathology.  She was seen in the emergency department yesterday for chest pain/abdominal pain, which showed unremarkable lab work-up, normal EKG, and a negative CT abdomen.  She states that she has been prescribed prednisone, meloxicam, Prilosec, and other medications in the past.  She reports some relief with meloxicam, however she is also currently being worked up for possible GERD and has an appointment with gastroenterology soon.  Denies fever/chills, shortness of breath, abdominal pain, flank pain, nausea/vomiting, rash/lesions, numb/tingling upper or lower extremities, dizziness/lightheadedness, or urinary symptoms ? ?History Limitations: No limitations. ? ?    ? ? ?Physical Exam  ?Triage Vital Signs: ?ED Triage Vitals  ?Enc Vitals Group  ?   BP 11/27/21 0830 118/77  ?   Pulse Rate 11/27/21 0830 85  ?   Resp 11/27/21 0830 19  ?   Temp 11/27/21 0830 98.2 ?F (36.8 ?C)  ?   Temp Source 11/27/21 0830 Oral  ?   SpO2 11/27/21 0830 99 %  ?   Weight 11/27/21 0839 222 lb 6.4  oz (100.9 kg)  ?   Height 11/27/21 0839 5\' 7"  (1.702 m)  ?   Head Circumference --   ?   Peak Flow --   ?   Pain Score 11/27/21 0835 9  ?   Pain Loc --   ?   Pain Edu? --   ?   Excl. in GC? --   ? ? ?Most recent vital signs: ?Vitals:  ? 11/27/21 0830  ?BP: 118/77  ?Pulse: 85  ?Resp: 19  ?Temp: 98.2 ?F (36.8 ?C)  ?SpO2: 99%  ? ? ?General: Awake, NAD.  ?Skin: Warm, dry. No rashes or lesions.  ?Eyes: PERRL. Conjunctivae normal.  ?CV: Good peripheral perfusion.  ?Resp: Normal effort.  Lung sounds are clear bilaterally in the apices bases. ?Abd: Soft, non-tender. No distention.  ?Neuro: At baseline. No gross neurological deficits.  ? ?Focused Exam: Patient does have notable tenderness when palpating the chest wall, particularly on the left side around ribs 3 through 7.  Tenderness when palpating the left shoulder joint as well, though she maintains normal range of motion.  No gross deformities present.  Pulse, motor, sensation intact distally in all extremities. ? ?Physical Exam ? ? ? ?ED Results / Procedures / Treatments  ?Labs ?(all labs ordered are listed, but only abnormal results are displayed) ?Labs Reviewed  ?BASIC METABOLIC PANEL  ?CBC  ?POC URINE PREG, ED  ?TROPONIN I (HIGH SENSITIVITY)  ? ? ? ?EKG ?Sinus rhythm, rate of 85, no T-segment changes, no AV blocks,  no axis deviations, normal QRS interval. ? ? ?RADIOLOGY ? ?ED Provider Interpretation: I personally reviewed and interpreted these images, chest x-ray shows no evidence of acute cardiopulmonary abnormalities patient my interpretation. ? ?DG Chest 2 View ? ?Result Date: 11/27/2021 ?CLINICAL DATA:  Chest pain. EXAM: CHEST - 2 VIEW COMPARISON:  Prior chest radiographs 11/26/2021 and earlier. FINDINGS: Heart size within normal limits. No appreciable airspace consolidation. No evidence of pleural effusion or pneumothorax. No acute bony abnormality identified. IMPRESSION: No evidence of active cardiopulmonary disease. Electronically Signed   By: Jackey Loge D.O.    On: 11/27/2021 08:59  ? ?DG Chest 2 View ? ?Result Date: 11/26/2021 ?CLINICAL DATA:  Syncopal episode this morning. EXAM: CHEST - 2 VIEW COMPARISON:  11/24/2018 3D chest x-ray and chest CT. FINDINGS: The cardiac silhouette, mediastinal and hilar contours are normal. The lungs are clear. No pulmonary lesions or pleural effusions. No pneumothorax. The bony thorax is intact. IMPRESSION: No acute cardiopulmonary findings. Electronically Signed   By: Rudie Meyer M.D.   On: 11/26/2021 12:29  ? ?CT ABDOMEN PELVIS W CONTRAST ? ?Result Date: 11/26/2021 ?CLINICAL DATA:  Left lower quadrant pain EXAM: CT ABDOMEN AND PELVIS WITH CONTRAST TECHNIQUE: Multidetector CT imaging of the abdomen and pelvis was performed using the standard protocol following bolus administration of intravenous contrast. RADIATION DOSE REDUCTION: This exam was performed according to the departmental dose-optimization program which includes automated exposure control, adjustment of the mA and/or kV according to patient size and/or use of iterative reconstruction technique. CONTRAST:  OMNIPAQUE IOHEXOL 300 MG/ML  SOLN COMPARISON:  CT abdomen and pelvis 09/08/2021 FINDINGS: Lower chest: No acute abnormality. Hepatobiliary: Liver is normal in size and contour with no suspicious mass identified. Faint hypodensity adjacent to the falciform ligament likely represents focal fatty infiltration. Gallbladder is contracted no surrounding inflammatory changes visualized. No biliary ductal dilatation. Pancreas: Unremarkable. No pancreatic ductal dilatation or surrounding inflammatory changes. Spleen: Normal in size without focal abnormality. Adrenals/Urinary Tract: Adrenal glands are unremarkable. Kidneys are normal, without renal calculi, focal lesion, or hydronephrosis. Bladder is unremarkable. Stomach/Bowel: No bowel obstruction, free air or pneumatosis. Moderate to large amount of retained fecal material throughout the colon. No bowel wall edema identified.  Appendix is normal. Vascular/Lymphatic: No significant vascular findings are present. No enlarged abdominal or pelvic lymph nodes. Reproductive: Uterus is unremarkable. 3.8 cm right adnexal/ovarian cyst. Other: No ascites. Musculoskeletal: No acute or significant osseous findings. IMPRESSION: 1. No acute process identified. 2. Moderate to large amount of retained fecal material noted in the colon. 3. 3.8 cm right ovarian cyst noted, no follow-up imaging required. Electronically Signed   By: Jannifer Hick M.D.   On: 11/26/2021 13:39   ? ?PROCEDURES: ? ?Critical Care performed: None. ? ?Procedures ? ? ? ?MEDICATIONS ORDERED IN ED: ?Medications - No data to display ? ? ?IMPRESSION / MDM / ASSESSMENT AND PLAN / ED COURSE  ?I reviewed the triage vital signs and the nursing notes. ?             ?               ? ?Differential diagnosis includes, but is not limited to, ACS, myocarditis/pericarditis, costochondritis, GERD, vitamin D deficiency, rheumatoid arthritis ? ?ED Course ?Patient appears well, vitals within normal limits.  NAD.  She states that her pain has mostly subsided now since when she was originally brought in. ? ?Assessment/Plan ?Patient presents with ongoing chest/shoulder pain x 5 months since COVID exposure.  Her work-up yesterday  was unremarkable, lab work-up was reassuring.  CT abdomen and CT angio within the past few weeks has been negative.  Her recent echocardiogram showed no evidence of acute inflammation or effusion.  Unable to identify the source of her pain at this time.  Her EKG and chest x-ray today are unremarkable.  I do not feel it is necessary to repeat her lab work-up or imaging that has been done recently as it is highly unlikely that it will be different morning given her chronic symptoms.  She currently has an appointment soon with gastroenterology, neurology, and rheumatology for further evaluation of the symptoms.  I suspect that it may be potentially a combination of several  factors, including GERD, anxiety/depression, and possible rheumatological process to be evaluated further.  Nonetheless, no serious life-threatening pathology suspected at this time.  We will plan to discha

## 2021-11-27 NOTE — ED Triage Notes (Addendum)
Pt here with some ongoing CP for months but has become stronger. Pt also c/o left shoulder pain. Pt states pain is across her whole chest and radiates to both shoulders. Pt endorses nausea as well.  ? ?

## 2021-11-27 NOTE — Discharge Instructions (Addendum)
-  Please follow-up with your primary care provider and follow through with your scheduled appointments with specialists for further evaluation of your symptoms. ?-Continue to take Tylenol/ibuprofen as needed for pain.  Be sure to take Prilosec with it to avoid ulcers/stomach upset. ?-Return to the emergency department anytime if you begin to experience any new or worsening symptoms. ?

## 2021-11-30 ENCOUNTER — Encounter
Admission: RE | Admit: 2021-11-30 | Discharge: 2021-11-30 | Disposition: A | Discharge status: Home / Self Care | Payer: Medicaid Other | Source: Ambulatory Visit | Attending: Obstetrics and Gynecology | Admitting: Obstetrics and Gynecology

## 2021-11-30 ENCOUNTER — Other Ambulatory Visit
Admission: RE | Admit: 2021-11-30 | Discharge: 2021-11-30 | Disposition: A | Discharge status: Home / Self Care | Payer: Medicaid Other | Source: Ambulatory Visit | Attending: Obstetrics and Gynecology | Admitting: Obstetrics and Gynecology

## 2021-11-30 DIAGNOSIS — Z01818 Encounter for other preprocedural examination: Secondary | ICD-10-CM

## 2021-11-30 DIAGNOSIS — Z01812 Encounter for preprocedural laboratory examination: Secondary | ICD-10-CM | POA: Diagnosis not present

## 2021-11-30 HISTORY — DX: Cardiac murmur, unspecified: R01.1

## 2021-11-30 HISTORY — DX: Gastro-esophageal reflux disease without esophagitis: K21.9

## 2021-11-30 LAB — TYPE AND SCREEN
ABO/RH(D): A NEG
Antibody Screen: NEGATIVE

## 2021-11-30 NOTE — Patient Instructions (Addendum)
?Your procedure is scheduled on: Monday Dec 03, 2021. ?Report to Day [facility] inside [facility] 2nd floor, stop by admissions desk before getting on elevator.  ?To find out your arrival time please call [phone] between 1PM - 3PM on Friday Nov 30, 2021. ? ?Remember: Instructions that are not followed completely may result in serious medical risk,  ?up to and including death, or upon the discretion of your surgeon and anesthesiologist your  ?surgery may need to be rescheduled.  ? ?  _X__ 1. Do not eat food or drink fluids after midnight the night before your procedure. ?                No chewing gum or hard candies.  ? ?__X__2.  On the morning of surgery brush your teeth with toothpaste and water, you ?               may rinse your mouth with mouthwash if you wish.  Do not swallow any toothpaste or mouthwash. ?   ? _X__ 3.  No Alcohol for 24 hours before or after surgery. ? ? _X__ 4.  Do Not Smoke or use e-cigarettes For 24 Hours Prior to Your Surgery. ?                Do not use any chewable tobacco products for at least 6 hours prior to ?                Surgery. ? ?_X__  5.  Do not use any recreational drugs (marijuana, cocaine, heroin, ecstasy, MDMA or other) ?               For at least one week prior to your surgery.  Combination of these drugs with anesthesia ?               May have life threatening results. ? ?____  6.  Bring all medications with you on the day of surgery if instructed.  ? ?__X_ 7.  Notify your doctor if there is any change in your medical condition  ?    (cold, fever, infections). ?    ?Do not wear jewelry, make-up, hairpins, clips or nail polish. ?Do not wear lotions, powders, or perfumes. You may wear deodorant. ?Do not shave 48 hours prior to surgery.  ?Do not bring valuables to the hospital.   ? ?[facility] is not responsible for any belongings or valuables. ? ?Contacts, dentures or bridgework may not be worn into surgery. ?Leave your suitcase in the car.  After surgery it may be brought to your room. ?For patients admitted to the hospital, discharge time is determined by your ?treatment team. ?  ?Patients discharged the day of surgery will not be allowed to drive home.   ?Make arrangements for someone to be with you for the first 24 hours of your ?Same Day Discharge. ? ? ?__X__ Take these medicines the morning of surgery with A SIP OF WATER:  ? ? 1. sertraline (ZOLOFT) 50 MG  ? 2. omeprazole (PRILOSEC OTC) 20 MG ? 3.  ? 4. ? 5. ? 6. ? ?____ Fleet Enema (as directed)  ? ?__X__ Use CHG Soap (or wipes) as directed ? ?____ Use Benzoyl Peroxide Gel as instructed ? ?____ Use inhalers on the day of surgery ? ?____ Stop metformin 2 days prior to surgery   ? ?____ Take 1/2 of usual insulin dose the night before surgery. No insulin the morning ?  of surgery.  ? ?____ Call your PCP, cardiologist, or Pulmonologist if taking Coumadin/Plavix/aspirin and ask when to stop before your surgery.  ? ?__X__ One Week prior to surgery- Stop Anti-inflammatories such as Ibuprofen, Aleve, Advil, Motrin, meloxicam (MOBIC), diclofenac, etodolac, ketorolac, Toradol, Daypro, piroxicam, Goody's or BC powders. OK TO USE TYLENOL IF NEEDED ?  ?__X__ Stop supplements until after surgery.   ? ?____ Bring C-Pap to the hospital.  ? ? ?If you have any questions regarding your pre-procedure instructions,  ?Please call Pre-admit Testing at [phone] ?

## 2021-12-03 ENCOUNTER — Other Ambulatory Visit: Payer: Self-pay

## 2021-12-03 ENCOUNTER — Ambulatory Visit: Payer: Medicaid Other | Admitting: Certified Registered Nurse Anesthetist

## 2021-12-03 ENCOUNTER — Ambulatory Visit
Admission: RE | Admit: 2021-12-03 | Discharge: 2021-12-03 | Disposition: A | Discharge status: Home / Self Care | Payer: Medicaid Other | Attending: Obstetrics and Gynecology | Admitting: Obstetrics and Gynecology

## 2021-12-03 ENCOUNTER — Encounter: Payer: Self-pay | Admitting: Obstetrics and Gynecology

## 2021-12-03 ENCOUNTER — Encounter
Admission: RE | Disposition: A | Discharge status: Home / Self Care | Payer: Self-pay | Source: Home / Self Care | Attending: Obstetrics and Gynecology

## 2021-12-03 DIAGNOSIS — Z8669 Personal history of other diseases of the nervous system and sense organs: Secondary | ICD-10-CM | POA: Insufficient documentation

## 2021-12-03 DIAGNOSIS — F17210 Nicotine dependence, cigarettes, uncomplicated: Secondary | ICD-10-CM | POA: Diagnosis not present

## 2021-12-03 DIAGNOSIS — Z302 Encounter for sterilization: Secondary | ICD-10-CM | POA: Diagnosis present

## 2021-12-03 HISTORY — PX: LAPAROSCOPIC TUBAL LIGATION: SHX1937

## 2021-12-03 LAB — POCT PREGNANCY, URINE: Preg Test, Ur: NEGATIVE

## 2021-12-03 LAB — ABO/RH: ABO/RH(D): A NEG

## 2021-12-03 SURGERY — LIGATION, FALLOPIAN TUBE, LAPAROSCOPIC
Anesthesia: General | Site: Abdomen | Laterality: Bilateral

## 2021-12-03 MED ORDER — CHLORHEXIDINE GLUCONATE 0.12 % MT SOLN
OROMUCOSAL | Status: AC
  Administered 2021-12-03: 15 mL via OROMUCOSAL
  Filled 2021-12-03: qty 15

## 2021-12-03 MED ORDER — 0.9 % SODIUM CHLORIDE (POUR BTL) OPTIME
TOPICAL | Status: DC | PRN
  Administered 2021-12-03: 500 mL

## 2021-12-03 MED ORDER — LIDOCAINE HCL (PF) 2 % IJ SOLN
INTRAMUSCULAR | Status: AC
  Filled 2021-12-03: qty 10

## 2021-12-03 MED ORDER — FENTANYL CITRATE (PF) 100 MCG/2ML IJ SOLN
INTRAMUSCULAR | Status: AC
  Filled 2021-12-03: qty 2

## 2021-12-03 MED ORDER — BUPIVACAINE HCL 0.5 % IJ SOLN
INTRAMUSCULAR | Status: DC | PRN
  Administered 2021-12-03: 8 mL

## 2021-12-03 MED ORDER — PROPOFOL 10 MG/ML IV BOLUS
INTRAVENOUS | Status: AC
  Filled 2021-12-03: qty 20

## 2021-12-03 MED ORDER — MIDAZOLAM HCL 2 MG/2ML IJ SOLN
INTRAMUSCULAR | Status: AC
  Administered 2021-12-03: 1 mg via INTRAVENOUS
  Filled 2021-12-03: qty 2

## 2021-12-03 MED ORDER — FENTANYL CITRATE (PF) 100 MCG/2ML IJ SOLN
INTRAMUSCULAR | Status: DC | PRN
  Administered 2021-12-03 (×2): 50 ug via INTRAVENOUS

## 2021-12-03 MED ORDER — SODIUM CHLORIDE FLUSH 0.9 % IV SOLN
INTRAVENOUS | Status: AC
  Filled 2021-12-03: qty 10

## 2021-12-03 MED ORDER — POVIDONE-IODINE 10 % EX SWAB: 2.0000 "application " | Freq: Once | CUTANEOUS | Status: DC

## 2021-12-03 MED ORDER — GLYCOPYRROLATE 0.2 MG/ML IJ SOLN
INTRAMUSCULAR | Status: DC | PRN
  Administered 2021-12-03: .2 mg via INTRAVENOUS

## 2021-12-03 MED ORDER — PROPOFOL 10 MG/ML IV BOLUS
INTRAVENOUS | Status: DC | PRN
  Administered 2021-12-03: 160 mg via INTRAVENOUS

## 2021-12-03 MED ORDER — DEXMEDETOMIDINE HCL IN NACL 200 MCG/50ML IV SOLN
INTRAVENOUS | Status: DC | PRN
  Administered 2021-12-03 (×2): 4 ug via INTRAVENOUS
  Administered 2021-12-03: 8 ug via INTRAVENOUS
  Administered 2021-12-03: 4 ug via INTRAVENOUS

## 2021-12-03 MED ORDER — ORAL CARE MOUTH RINSE: 15.0000 mL | Freq: Once | OROMUCOSAL | Status: AC

## 2021-12-03 MED ORDER — SUCCINYLCHOLINE CHLORIDE 200 MG/10ML IV SOSY
PREFILLED_SYRINGE | INTRAVENOUS | Status: AC
  Filled 2021-12-03: qty 20

## 2021-12-03 MED ORDER — DEXAMETHASONE SODIUM PHOSPHATE 10 MG/ML IJ SOLN
INTRAMUSCULAR | Status: DC | PRN
  Administered 2021-12-03: 10 mg via INTRAVENOUS

## 2021-12-03 MED ORDER — KETOROLAC TROMETHAMINE 30 MG/ML IJ SOLN
INTRAMUSCULAR | Status: DC | PRN
Start: 2021-12-03 — End: 2021-12-03
  Administered 2021-12-03: 30 mg via INTRAVENOUS

## 2021-12-03 MED ORDER — ROCURONIUM BROMIDE 10 MG/ML (PF) SYRINGE
PREFILLED_SYRINGE | INTRAVENOUS | Status: AC
  Filled 2021-12-03: qty 10

## 2021-12-03 MED ORDER — ONDANSETRON HCL 4 MG/2ML IJ SOLN
INTRAMUSCULAR | Status: DC | PRN
  Administered 2021-12-03: 4 mg via INTRAVENOUS

## 2021-12-03 MED ORDER — ACETAMINOPHEN 10 MG/ML IV SOLN
INTRAVENOUS | Status: AC
  Filled 2021-12-03: qty 100

## 2021-12-03 MED ORDER — HYDROCODONE-ACETAMINOPHEN 5-325 MG PO TABS: 1.0000 | ORAL_TABLET | Freq: Four times a day (QID) | ORAL | 0 refills | Status: DC | PRN

## 2021-12-03 MED ORDER — MIDAZOLAM HCL 2 MG/2ML IJ SOLN
INTRAMUSCULAR | Status: AC
Start: 2021-12-03 — End: ?
  Filled 2021-12-03: qty 2

## 2021-12-03 MED ORDER — ACETAMINOPHEN 10 MG/ML IV SOLN
INTRAVENOUS | Status: DC | PRN
  Administered 2021-12-03: 1000 mg via INTRAVENOUS

## 2021-12-03 MED ORDER — LACTATED RINGERS IV SOLN: INTRAVENOUS | Status: DC

## 2021-12-03 MED ORDER — BUPIVACAINE HCL (PF) 0.5 % IJ SOLN
INTRAMUSCULAR | Status: AC
  Filled 2021-12-03: qty 30

## 2021-12-03 MED ORDER — LIDOCAINE HCL (CARDIAC) PF 100 MG/5ML IV SOSY
PREFILLED_SYRINGE | INTRAVENOUS | Status: DC | PRN
  Administered 2021-12-03: 60 mg via INTRAVENOUS

## 2021-12-03 MED ORDER — ONDANSETRON HCL 4 MG/2ML IJ SOLN: 4.0000 mg | Freq: Once | INTRAMUSCULAR | Status: DC | PRN

## 2021-12-03 MED ORDER — FENTANYL CITRATE (PF) 100 MCG/2ML IJ SOLN
25.0000 ug | INTRAMUSCULAR | Status: DC | PRN
  Administered 2021-12-03: 25 ug via INTRAVENOUS

## 2021-12-03 MED ORDER — MIDAZOLAM HCL 2 MG/2ML IJ SOLN: 1.0000 mg | Freq: Once | INTRAMUSCULAR | Status: AC

## 2021-12-03 MED ORDER — FENTANYL CITRATE (PF) 100 MCG/2ML IJ SOLN
INTRAMUSCULAR | Status: AC
  Administered 2021-12-03: 25 ug via INTRAVENOUS
  Filled 2021-12-03: qty 2

## 2021-12-03 MED ORDER — ROCURONIUM BROMIDE 100 MG/10ML IV SOLN
INTRAVENOUS | Status: DC | PRN
  Administered 2021-12-03: 20 mg via INTRAVENOUS
  Administered 2021-12-03: 60 mg via INTRAVENOUS

## 2021-12-03 MED ORDER — PHENYLEPHRINE 80 MCG/ML (10ML) SYRINGE FOR IV PUSH (FOR BLOOD PRESSURE SUPPORT)
PREFILLED_SYRINGE | INTRAVENOUS | Status: DC | PRN
  Administered 2021-12-03: 80 ug via INTRAVENOUS

## 2021-12-03 MED ORDER — IBUPROFEN 800 MG PO TABS: 800.0000 mg | ORAL_TABLET | Freq: Three times a day (TID) | ORAL | 0 refills | Status: DC | PRN

## 2021-12-03 MED ORDER — ONDANSETRON HCL 4 MG/2ML IJ SOLN
INTRAMUSCULAR | Status: AC
  Filled 2021-12-03: qty 2

## 2021-12-03 MED ORDER — CHLORHEXIDINE GLUCONATE 0.12 % MT SOLN: 15.0000 mL | Freq: Once | OROMUCOSAL | Status: AC

## 2021-12-03 MED ORDER — DEXAMETHASONE SODIUM PHOSPHATE 10 MG/ML IJ SOLN
INTRAMUSCULAR | Status: AC
  Filled 2021-12-03: qty 1

## 2021-12-03 MED ORDER — SUGAMMADEX SODIUM 200 MG/2ML IV SOLN
INTRAVENOUS | Status: DC | PRN
  Administered 2021-12-03: 200 mg via INTRAVENOUS

## 2021-12-03 MED ORDER — MIDAZOLAM HCL 2 MG/2ML IJ SOLN
INTRAMUSCULAR | Status: DC | PRN
  Administered 2021-12-03: 2 mg via INTRAVENOUS

## 2021-12-03 SURGICAL SUPPLY — 33 items
ADH SKN CLS APL DERMABOND .7 (GAUZE/BANDAGES/DRESSINGS) ×1
APL PRP STRL LF DISP 70% ISPRP (MISCELLANEOUS) ×1
BLADE SURG SZ11 CARB STEEL (BLADE) ×2 IMPLANT
CATH ROBINSON RED A/P 16FR (CATHETERS) ×2 IMPLANT
CHLORAPREP W/TINT 26 (MISCELLANEOUS) ×2 IMPLANT
CLIP FILSHIE TUBAL LIGA STRL (Clip) ×2 IMPLANT
DERMABOND ADVANCED (GAUZE/BANDAGES/DRESSINGS) ×1
DERMABOND ADVANCED .7 DNX12 (GAUZE/BANDAGES/DRESSINGS) ×1 IMPLANT
GAUZE 4X4 16PLY ~~LOC~~+RFID DBL (SPONGE) ×2 IMPLANT
GLOVE BIOGEL PI ORTHO PRO 7.5 (GLOVE) ×2
GLOVE PI ORTHO PRO STRL 7.5 (GLOVE) ×2 IMPLANT
GOWN STRL REUS W/ TWL LRG LVL3 (GOWN DISPOSABLE) ×1 IMPLANT
GOWN STRL REUS W/ TWL XL LVL3 (GOWN DISPOSABLE) ×1 IMPLANT
GOWN STRL REUS W/TWL LRG LVL3 (GOWN DISPOSABLE) ×2
GOWN STRL REUS W/TWL XL LVL3 (GOWN DISPOSABLE) ×2
IRRIGATION STRYKERFLOW (MISCELLANEOUS) IMPLANT
IRRIGATOR STRYKERFLOW (MISCELLANEOUS)
IV LACTATED RINGERS 1000ML (IV SOLUTION) IMPLANT
KIT PINK PAD W/HEAD ARE REST (MISCELLANEOUS) ×2
KIT PINK PAD W/HEAD ARM REST (MISCELLANEOUS) ×1 IMPLANT
KIT TURNOVER CYSTO (KITS) ×2 IMPLANT
MANIFOLD NEPTUNE II (INSTRUMENTS) ×2 IMPLANT
NS IRRIG 500ML POUR BTL (IV SOLUTION) ×2 IMPLANT
PACK GYN LAPAROSCOPIC (MISCELLANEOUS) ×2 IMPLANT
PAD OB MATERNITY 4.3X12.25 (PERSONAL CARE ITEMS) ×2 IMPLANT
PAD PREP 24X41 OB/GYN DISP (PERSONAL CARE ITEMS) ×2 IMPLANT
SET TUBE SMOKE EVAC HIGH FLOW (TUBING) ×2 IMPLANT
SOL PREP PVP 2OZ (MISCELLANEOUS) ×2
SOLUTION PREP PVP 2OZ (MISCELLANEOUS) ×1 IMPLANT
SUT VIC AB 4-0 FS2 27 (SUTURE) ×2 IMPLANT
SUT VICRYL 0 AB UR-6 (SUTURE) ×2 IMPLANT
TROCAR XCEL 12X100 BLDLESS (ENDOMECHANICALS) ×2 IMPLANT
WATER STERILE IRR 500ML POUR (IV SOLUTION) ×2 IMPLANT

## 2021-12-03 NOTE — Anesthesia Procedure Notes (Signed)
Procedure Name: Intubation ?Date/Time: 12/03/2021 10:53 AM ?Performed by: Lily Peer, Alazay Leicht, CRNA ?Pre-anesthesia Checklist: Patient identified, Emergency Drugs available, Suction available, Patient being monitored and Timeout performed ?Patient Re-evaluated:Patient Re-evaluated prior to induction ?Oxygen Delivery Method: Circle system utilized ?Preoxygenation: Pre-oxygenation with 100% oxygen ?Induction Type: IV induction ?Ventilation: Mask ventilation without difficulty ?Laryngoscope Size: McGraph and 3 ?Grade View: Grade I ?Tube type: Oral ?Tube size: 7.0 mm ?Number of attempts: 1 ?Airway Equipment and Method: Stylet ?Placement Confirmation: ETT inserted through vocal cords under direct vision, positive ETCO2 and breath sounds checked- equal and bilateral ?Secured at: 21 cm ?Tube secured with: Tape ?Dental Injury: Teeth and Oropharynx as per pre-operative assessment  ? ? ? ? ?

## 2021-12-03 NOTE — Transfer of Care (Signed)
Immediate Anesthesia Transfer of Care Note ? ?Patient: Kendra [name] ? ?Procedure(s) Performed: LAPAROSCOPIC TUBAL LIGATION (Bilateral) ? ?Patient Location: PACU ? ?Anesthesia Type:General ? ?Level of Consciousness: awake, alert  and oriented ? ?Airway & Oxygen Therapy: Patient Spontanous Breathing and Patient connected to face mask oxygen ? ?Post-op Assessment: Report given to RN and Post -op Vital signs reviewed and stable ? ?Post vital signs: Reviewed and stable ? ?Last Vitals:  ?Vitals Value Taken Time  ?BP 121/62 12/03/21 1135  ?Temp 36.2 ?C 12/03/21 1135  ?Pulse 63 12/03/21 1135  ?Resp 17 12/03/21 1135  ?SpO2 100 % 12/03/21 1135  ?Vitals shown include unvalidated device data. ? ?Last Pain:  ?Vitals:  ? 12/03/21 0921  ?TempSrc: Temporal  ?PainSc: 0-No pain  ?   ? ?  ? ?Complications: No notable events documented. ?

## 2021-12-03 NOTE — Anesthesia Preprocedure Evaluation (Signed)
Anesthesia Evaluation  ?Patient identified by MRN, date of birth, ID band ?Patient awake ? ? ? ?Reviewed: ?Allergy & Precautions, H&P , NPO status , Patient's Chart, lab work & pertinent test results, reviewed documented beta blocker date and time  ? ?Airway ?Mallampati: II ? ?TM Distance: >3 FB ?Neck ROM: full ? ? ? Dental ? ?(+) Teeth Intact ?  ?Pulmonary ?neg pulmonary ROS, Current Smoker and Patient abstained from smoking.,  ?  ?Pulmonary exam normal ? ? ? ? ? ? ? Cardiovascular ?Exercise Tolerance: Good ?Normal cardiovascular exam+ Valvular Problems/Murmurs  ?Rhythm:regular Rate:Normal ? ? ?  ?Neuro/Psych ? Headaches, Seizures -,  PSYCHIATRIC DISORDERS Anxiety Depression   ? GI/Hepatic ?Neg liver ROS, GERD  Medicated,  ?Endo/Other  ?negative endocrine ROS ? Renal/GU ?negative Renal ROS  ?negative genitourinary ?  ?Musculoskeletal ? ? Abdominal ?  ?Peds ? Hematology ?negative hematology ROS ?(+)   ?Anesthesia Other Findings ?Past Medical History: ?No date: Anxiety ?No date: Depression ?No date: GERD (gastroesophageal reflux disease) ?No date: Headache ?    Comment:  stress ?No date: Heart murmur ?No date: History of cannabis abuse ?No date: Seizures (HCC) ?    Comment:  age 31. syncopal episode. Hit head. had seizure.  ?No date: Tobacco abuse ?No date: Vitamin D deficiency ?Past Surgical History: ?2013: COLON SURGERY ?    Comment:  pt states "colonoscopy while awake" ?12/14/2015: COLONOSCOPY WITH PROPOFOL; N/A ?    Comment:  Procedure: COLONOSCOPY WITH PROPOFOL;  Surgeon: Ebony Hail  ?             Servando Snare, MD;  Location: [facility];  Service:  ?             Endoscopy;  Laterality: N/A; ?12/14/2015: ESOPHAGOGASTRODUODENOSCOPY (EGD) WITH PROPOFOL; N/A ?    Comment:  Procedure: ESOPHAGOGASTRODUODENOSCOPY (EGD) WITH  ?             PROPOFOL;  Surgeon: Midge Minium, MD;  Location: [facility]  ?             [facility];  Service: Endoscopy;  Laterality: N/A; ?2013: WISDOM TOOTH  EXTRACTION ?BMI   ? Body Mass Index: 34.46 kg/m?  ?  ? Reproductive/Obstetrics ?negative OB ROS ? ?  ? ? ? ? ? ? ? ? ? ? ? ? ? ?  ?  ? ? ? ? ? ? ? ? ?Anesthesia Physical ?Anesthesia Plan ? ?ASA: 2 ? ?Anesthesia Plan: General ETT  ? ?Post-op Pain Management:   ? ?Induction:  ? ?PONV Risk Score and Plan: 3 ? ?Airway Management Planned:  ? ?Additional Equipment:  ? ?Intra-op Plan:  ? ?Post-operative Plan:  ? ?Informed Consent: I have reviewed the patients History and Physical, chart, labs and discussed the procedure including the risks, benefits and alternatives for the proposed anesthesia with the patient or authorized representative who has indicated his/her understanding and acceptance.  ? ? ? ?Dental Advisory Given ? ?Plan Discussed with: CRNA ? ?Anesthesia Plan Comments:   ? ? ? ? ? ? ?Anesthesia Quick Evaluation ? ?

## 2021-12-03 NOTE — Discharge Instructions (Signed)

## 2021-12-03 NOTE — Progress Notes (Signed)
Virtual Visit via Video Note ? ?I connected with Kendra [name] on 12/05/21 at 10:00 AM EDT by a video enabled telemedicine application and verified that I am speaking with the correct person using two identifiers. ? ?Location: ?Patient: car ?Provider: office ?Persons participated in the visit- patient, provider  ?  ?I discussed the limitations of evaluation and management by telemedicine and the availability of in person appointments. The patient expressed understanding and agreed to proceed. ? ? ?  ?I discussed the assessment and treatment plan with the patient. The patient was provided an opportunity to ask questions and all were answered. The patient agreed with the plan and demonstrated an understanding of the instructions. ?  ?The patient was advised to call back or seek an in-person evaluation if the symptoms worsen or if the condition fails to improve as anticipated. ? ?I provided 39 minutes of non-face-to-face time during this encounter. ? ? ?Norman Clay, MD ? ? ? ? Psychiatric Initial Adult Assessment  ? ?Patient Identification: Kendra [name] ?MRN:  [id number] ?Date of Evaluation:  12/05/2021 ?Referral Source: [name], PA  ?Chief Complaint:   ?Chief Complaint  ?Patient presents with  ? Establish Care  ? ?Visit Diagnosis:  ?  ICD-10-CM   ?1. MDD (major depressive disorder), recurrent, in partial remission ([place])  F33.41   ?  ?2. Anxiety state  F41.1   ?  ? ? ?History of Present Illness:   ? ?Kendra [name] is a 31 y.o. year old female with a history of depression, anxiety, history of opioid dependence on Suboxone, seizure, cervical spondylosis, r/o fibromyalgia, who is referred for depression, anxiety.  ?Per chart review, she presented to ED for various complaints including anxiety, chest pain, generalized pain, dizziness; more than ten times since Dec 2022. She was seen by rheumatology, and was ruled out rheumatological disease. She was seen by neurology; head CT, MRI, EEG was  unremarkable.  ? ?She states that she does not know why she was advised to make this appointment.  However, she states that she has been having pain for the past five months on her shoulder, breast and chest.  She has other symptoms of dizziness. She does not have good quality of life due to her symptoms. She wants to know what the cause of her symptoms is.  She has been seen by rheumatologist, neurologist, and the only finding was vitamin D deficiency. She thinks her symptoms has worsened after she had COVID in January.  She wonders if she has long COVID, or any other disease.  Although she suffers from anxiety for her entire life, she has never experienced this type of chest pain.  She voiced her concerned to providers.  She is considering transferring PCP to get better care.   She states that symptoms has started out of nowhere.  She states that she has "100% no idea" about her symptoms.  She does not have any huge stress, and she thinks she is completely fine.  Although she used to take care of her great grandmother, she now has some help at home.  She reports great relationship with her daughter.  She tries to keep things to herself in front of her daughter so that she will not be worried about her symptoms.  She reports great support from her parents, and her grandmother.  ? ?Depression/anxiety-she reports hopelessness in relation to her physical symptoms.  However, it has been improving since being started on sertraline a few months ago.  She  tends to feel anxious when she has physical symptoms. She does not want to be alone due to this.  She has occasional fear of dying as she does not know what it is.  She did have a panic attack when she had chest pain.  Although she can feel irritable at times, it is manageable.  She has lost 30 pounds since being on healthy diet.  She has a history of SIB of cutting when she was a teenager; she denies any urge for many years.  She denies SI.  ? ?Substance-she reports  history of heroin use.  She has been on Suboxone, and has been abstinent for 4 years without craving. She made the decision for her daughter and her husband. She has strong desire to live.  Although she used marijuana in the past, she denies any recent use.  She denies alcohol use.  ? ? ?Medication- sertraline 50 mg daily for a few months, started at [facility], where she goes for Suboxone treatment ? ?Daily routine: household chores, gardening, sees her grandmother, helps her daughter to go to school ?Support: mother, grandmother, father ?Household: husband, daughter ?Marital status: married ?Number of children: 1 daughter, 1 yo ?Employment: unemployed, used to work in Librarian, academic, Proofreader ?Education:  had college experience in [place] ?Last PCP / ongoing medical evaluation:   ?Born in [place], grew up in [place] at age [age]. She moved from [place] to [place] to be closer with her grandmother and her father. She reports good childhood, although it was challenging when her parents got divorced when she was 89 year old. She describes her mother as a good mother, and reports great relationship with both of her parents. ? ?Associated Signs/Symptoms: ?Depression Symptoms:  depressed mood, ?anxiety, ?(Hypo) Manic Symptoms:   denies decreased need for sleep, euphoria ?Anxiety Symptoms:   anxiety in relation to chest pain ?Psychotic Symptoms:   denies AH, VH, paranoia ?PTSD Symptoms: ?Negative ? ?Past Psychiatric History:  ?Outpatient: saw a therapist when she was a teenager ?Psychiatry admission: denies ?Previous suicide attempt: denies, SIB of cutting when she was a teenager  ?Past trials of medication: lexapro  (caused some shaking, palpitation, which lasted for days), hydroxyzine ?History of violence:   ? ?Previous Psychotropic Medications: Yes  ? ?Substance Abuse History in the last 12 months:  No. ? ?Consequences of Substance Abuse: ?NA ? ?Past Medical History:  ?Past Medical History:  ?Diagnosis Date  ?  Anxiety   ? Depression   ? GERD (gastroesophageal reflux disease)   ? Headache   ? stress  ? Heart murmur   ? History of cannabis abuse   ? Seizures ([place])   ? age 89. syncopal episode. Hit head. had seizure.   ? Tobacco abuse   ? Vitamin D deficiency   ?  ?Past Surgical History:  ?Procedure Laterality Date  ? COLON SURGERY  2013  ? pt states "colonoscopy while awake"  ? COLONOSCOPY WITH PROPOFOL N/A 12/14/2015  ? Procedure: COLONOSCOPY WITH PROPOFOL;  Surgeon: Lucilla Lame, MD;  Location: [facility];  Service: Endoscopy;  Laterality: N/A;  ? ESOPHAGOGASTRODUODENOSCOPY (EGD) WITH PROPOFOL N/A 12/14/2015  ? Procedure: ESOPHAGOGASTRODUODENOSCOPY (EGD) WITH PROPOFOL;  Surgeon: Lucilla Lame, MD;  Location: [facility];  Service: Endoscopy;  Laterality: N/A;  ? LAPAROSCOPIC TUBAL LIGATION Bilateral 12/03/2021  ? Procedure: LAPAROSCOPIC TUBAL LIGATION;  Surgeon: Harlin Heys, MD;  Location: [facility];  Service: Gynecology;  Laterality: Bilateral;  ? [place] EXTRACTION  2013  ? ? ?Family  Psychiatric History: as below ? ?Family History:  ?Family History  ?Problem Relation Age of Onset  ? Depression Mother   ? Diabetes Maternal Grandfather   ? Congestive Heart Failure Maternal Grandfather   ? Diverticulitis Maternal Grandmother   ? Dementia Maternal Grandmother   ? ? ?Social History:   ?Social History  ? ?Socioeconomic History  ? Marital status: Married  ?  Spouse name: Not on file  ? Number of children: Not on file  ? Years of education: Not on file  ? Highest education level: Not on file  ?Occupational History  ? Not on file  ?Tobacco Use  ? Smoking status: Every Day  ?  Packs/day: 1.00  ?  Years: 8.00  ?  Pack years: 8.00  ?  Types: Cigarettes  ? Smokeless tobacco: Never  ?Vaping Use  ? Vaping Use: Never used  ?Substance and Sexual Activity  ? Alcohol use: No  ? Drug use: Not Currently  ?  Types: Marijuana  ? Sexual activity: Yes  ?  Birth control/protection: Pill  ?Other Topics Concern  ? Not on file   ?Social History Narrative  ? Not on file  ? ?Social Determinants of Health  ? ?Financial Resource Strain: Not on file  ?Food Insecurity: No Food Insecurity  ? Worried About Charity fundraiser in the Last Year: Ne

## 2021-12-03 NOTE — Op Note (Signed)
? ? ?    OPERATIVE NOTE ?12/03/2021 ?11:34 AM ? ?PRE-OPERATIVE DIAGNOSIS:  ?1) desires permanent sterilization ? ?POST-OPERATIVE DIAGNOSIS:  ?* No Diagnosis Codes entered * ? ?OPERATION:  Laparoscopic Filshie clip tubal occlusion for sterilization ? ?SURGEON(S): Surgeon(s) and Role: ?   Linzie Collin, MD - Primary  ? ?ANESTHESIA: Choice ? ?ESTIMATED BLOOD LOSS: 5 mL ? ?OPERATIVE FINDINGS: Normal pelvis ? ?SPECIMEN: * No specimens in log * ? ?COMPLICATIONS: None ? ?DISPOSITION: Stable to recovery room ? ?DESCRIPTION OF PROCEDURE: ?     The patient was prepped and draped in the dorsolithotomy position and placed under general anesthesia. The bladder was emptied. The cervix was grasped with a multi-toothed tenaculum and a uterine manipulator was placed within the cervical os respecting the position and curvature of the uterus. ?After changing gloves we proceeded abdominally. A small infraumbilical incision was made and a 11 mm trocar port was placed within the abdominopelvic cavity. The opening pressure was less than 7 mmHg.  Approximately 3 and 1/2 L of carbon dioxide gas was instilled within the abdominal pelvic cavity. The laparoscope was placed and the pelvis and abdomen were carefully inspected. ?The right fallopian tube was identified and followed out to its fine fimbriated end and then back approximate half the way. At this point the section of tube was elevated on Filshie clip and completely occluded in a perpendicular manner. The left fallopian tube was similarly identified and again the midportion of the tube is completely occluded using a Filshie clip in a perpendicular manner. ?Hemostasis of the fallopian tubes as well as the pelvis was noted. The laparoscope was removed the trocar sleeve was removed and the incision was closed with a deep suture through the fascia of 0 Vicryl followed by subcuticular closure of the skin. A long-acting anesthetic was injected.  Dermabond applied in the usual manner.   The uterine manipulator was removed. Hemostasis of the cervix was noted. The patient went to the recovery room in stable condition. ? ? ?Elonda Husky, M.D. ?12/03/2021 ?11:34 AM ? ?

## 2021-12-04 ENCOUNTER — Telehealth: Payer: Self-pay

## 2021-12-04 ENCOUNTER — Encounter: Payer: Self-pay | Admitting: Obstetrics and Gynecology

## 2021-12-04 NOTE — Anesthesia Postprocedure Evaluation (Signed)
Anesthesia Post Note ? ?Patient: Kendra [name] ? ?Procedure(s) Performed: LAPAROSCOPIC TUBAL LIGATION (Bilateral: Abdomen) ? ?Patient location during evaluation: PACU ?Anesthesia Type: General ?Level of consciousness: awake and alert ?Pain management: pain level controlled ?Vital Signs Assessment: post-procedure vital signs reviewed and stable ?Respiratory status: spontaneous breathing, nonlabored ventilation, respiratory function stable and patient connected to nasal cannula oxygen ?Cardiovascular status: blood pressure returned to baseline and stable ?Postop Assessment: no apparent nausea or vomiting ?Anesthetic complications: no ? ? ?No notable events documented. ? ? ?Last Vitals:  ?Vitals:  ? 12/03/21 1235 12/03/21 1257  ?BP:  110/76  ?Pulse: (!) 49 (!) 55  ?Resp: 12 18  ?Temp: (!) 36.1 ?C (!) 36.1 ?C  ?SpO2: 100% 100%  ?  ?Last Pain:  ?Vitals:  ? 12/04/21 0912  ?TempSrc:   ?PainSc: 2   ? ? ?  ?  ?  ?  ?  ?  ? ?Yevette Edwards ? ? ? ? ?

## 2021-12-04 NOTE — Telephone Encounter (Signed)
Spoke with patient regarding post-op recovery. Patient states she is overall well, slightly sore. She states she is being careful in when to take her Suboxone after having pain medication. She states if any other concerns or questions she will reach out. Aware of 2 week follow-up.  ?

## 2021-12-05 ENCOUNTER — Telehealth (INDEPENDENT_AMBULATORY_CARE_PROVIDER_SITE_OTHER): Payer: Medicaid Other | Admitting: Psychiatry

## 2021-12-05 ENCOUNTER — Encounter: Payer: Self-pay | Admitting: Psychiatry

## 2021-12-05 DIAGNOSIS — F3341 Major depressive disorder, recurrent, in partial remission: Secondary | ICD-10-CM

## 2021-12-05 DIAGNOSIS — F411 Generalized anxiety disorder: Secondary | ICD-10-CM | POA: Diagnosis not present

## 2021-12-05 NOTE — Patient Instructions (Signed)
Continue sertraline 50 mg daily  ?Next appointment: 6/28 at 11 AM  ?

## 2021-12-06 NOTE — H&P (Signed)
Signed    ?  ?   ?   ?   ?  ?                                            PRE-OPERATIVE HISTORY AND PHYSICAL EXAM ?  ?PCP:  [name], PA ?Subjective:  ?  ?HPI:  Kendra [name] is a 31 y.o. G2P1011.  Patient's last menstrual period was 11/09/2021 (exact date).  She presents today for a pre-op discussion and PE. ?  ?She has the following symptoms: She desires permanent sterilization.  She has previously been counseled in detail regarding long-acting methods of contraception and she has specifically declined these. ?  ?Review of Systems:  ?  ?Constitutional: Denied constitutional symptoms, night sweats, recent illness, fatigue, fever, insomnia and weight loss.  ?Eyes: Denied eye symptoms, eye pain, photophobia, vision change and visual disturbance.  ?Ears/Nose/Throat/Neck: Denied ear, nose, throat or neck symptoms, hearing loss, nasal discharge, sinus congestion and sore throat.  ?Cardiovascular: Denied cardiovascular symptoms, arrhythmia, chest pain/pressure, edema, exercise intolerance, orthopnea and palpitations.  ?Respiratory: Denied pulmonary symptoms, asthma, pleuritic pain, productive sputum, cough, dyspnea and wheezing.  ?Gastrointestinal: Denied, gastro-esophageal reflux, melena, nausea and vomiting.  ?Genitourinary: Denied genitourinary symptoms including symptomatic vaginal discharge, pelvic relaxation issues, and urinary complaints.  ?Musculoskeletal: Denied musculoskeletal symptoms, stiffness, swelling, muscle weakness and myalgia.  ?Dermatologic: Denied dermatology symptoms, rash and scar.  ?Neurologic: Denied neurology symptoms, dizziness, headache, neck pain and syncope.  ?Psychiatric: Denied psychiatric symptoms, anxiety and depression.  ?Endocrine: Denied endocrine symptoms including hot flashes and night sweats.  ?  ?                ?OB History  ?Gravida Para Term Preterm AB Living  ?2 1 1   1 1   ?SAB IAB Ectopic Multiple Live Births     ?        1     ?   ?# Outcome Date GA Lbr Len/2nd  Weight Sex Delivery Anes PTL Lv  ?2 Term 11/12/14 [redacted]w[redacted]d   10 lb 2 oz (4.593 kg) F Vag-Spont EPI N LIV  ?1 AB                 FD  ?  ?  ?    ?Past Medical History:  ?Diagnosis Date  ? Anxiety    ? Depression    ? Headache    ?  stress  ? History of cannabis abuse    ? Seizures ([facility])    ?  age 78. syncopal episode. Hit head. had seizure.   ? Tobacco abuse    ? Vitamin D deficiency    ?  ?  ?     ?Past Surgical History:  ?Procedure Laterality Date  ? COLON SURGERY   2013  ?  pt states "colonoscopy while awake"  ? COLONOSCOPY WITH PROPOFOL N/A 12/14/2015  ?  Procedure: COLONOSCOPY WITH PROPOFOL;  Surgeon: 12/16/2015, MD;  Location: [facility];  Service: Endoscopy;  Laterality: N/A;  ? ESOPHAGOGASTRODUODENOSCOPY (EGD) WITH PROPOFOL N/A 12/14/2015  ?  Procedure: ESOPHAGOGASTRODUODENOSCOPY (EGD) WITH PROPOFOL;  Surgeon: 12/16/2015, MD;  Location: [facility];  Service: Endoscopy;  Laterality: N/A;  ? WISDOM TOOTH EXTRACTION   2013  ?   ?  ?SOCIAL HISTORY: ?  ?Social History  ?  ?    ?Tobacco  Use  ?Smoking Status Every Day  ? Packs/day: 0.50  ? Years: 8.00  ? Pack years: 4.00  ? Types: Cigarettes  ?Smokeless Tobacco Never  ?  ?Social History  ?  ?   ?Substance and Sexual Activity  ?Alcohol Use No  ? ?  ?Social History  ?  ?    ?Substance and Sexual Activity  ?Drug Use Not Currently  ? Types: Marijuana  ?  ?  ?     ?Family History  ?Problem Relation Age of Onset  ? Diverticulitis Maternal Grandmother    ? Dementia Maternal Grandmother    ? Diabetes Maternal Grandfather    ? Congestive Heart Failure Maternal Grandfather    ?  ?  ?ALLERGIES:  Penicillins ?  ?MEDS: ?             ?      ?Current Outpatient Medications on File Prior to Visit  ?Medication Sig Dispense Refill  ? buprenorphine-naloxone (SUBOXONE) 8-2 mg SUBL SL tablet Place 1 tablet under the tongue daily. Patient uses 8mg  film bid      ? gabapentin (NEURONTIN) 100 MG capsule Take 100 mg by mouth 3 (three) times daily.      ? methocarbamol (ROBAXIN)  500 MG tablet Take 1 tablet (500 mg total) by mouth every 6 (six) hours as needed. 15 tablet 0  ? norethindrone (MICRONOR) 0.35 MG tablet Take 1 tablet by mouth daily.      ? sertraline (ZOLOFT) 50 MG tablet Take 50 mg by mouth daily.      ?  ?No current facility-administered medications on file prior to visit.  ?  ?  ?No orders of the defined types were placed in this encounter. ?  ?  ?Physical examination ?BP 114/79   Pulse 84   Ht 5\' 7"  (1.702 m)   Wt 219 lb 6.4 oz (99.5 kg)   LMP 11/09/2021 (Exact Date)   BMI 34.36 kg/m?  ?  ?General NAD, Conversant  ?HEENT Atraumatic; Op clear with mmm.  Normo-cephalic. Pupils reactive. Anicteric sclerae  ?Thyroid/Neck Smooth without nodularity or enlargement. Normal ROM.  Neck Supple.  ?Skin No rashes, lesions or ulceration. Normal palpated skin turgor. No nodularity.  ?Breasts: No masses or discharge.  Symmetric.  No axillary adenopathy.  ?Lungs: Clear to auscultation.No rales or wheezes. Normal Respiratory effort, no retractions.  ?Heart: NSR.  No murmurs or rubs appreciated. No periferal edema  ?Abdomen: Soft.  Non-tender.  No masses.  No HSM. No hernia  ?Extremities: Moves all appropriately.  Normal ROM for age. No lymphadenopathy.  ?Neuro: Oriented to PPT.  Normal mood. Normal affect.  ?  ?  Pelvic: Deferred to OR  ?  ?Assessment:  ?  ?G2P1011 ?    ?Patient Active Problem List  ?  Diagnosis Date Noted  ? Noninfectious diarrhea    ? Non-intractable cyclical vomiting with nausea    ? Diarrhea    ? Anxiety and depression 01/19/2015  ? History of cannabis dependence/abuse ([facility]) 01/19/2015  ? Tobacco abuse 01/19/2015  ?  ?  ?1. Pre-op exam   ?  ?  ?  ?Plan:  ?  ?Orders: ?No orders of the defined types were placed in this encounter. ?  ?  ?1.  Laparoscopic Filshie clip permanent sterilization ?  ?  ?  ? ?

## 2021-12-09 ENCOUNTER — Emergency Department
Admission: EM | Admit: 2021-12-09 | Discharge: 2021-12-09 | Disposition: A | Discharge status: Home / Self Care | Payer: Medicaid Other | Attending: Emergency Medicine | Admitting: Emergency Medicine

## 2021-12-09 ENCOUNTER — Other Ambulatory Visit: Payer: Self-pay

## 2021-12-09 DIAGNOSIS — G629 Polyneuropathy, unspecified: Secondary | ICD-10-CM

## 2021-12-09 DIAGNOSIS — M25512 Pain in left shoulder: Secondary | ICD-10-CM

## 2021-12-09 DIAGNOSIS — R42 Dizziness and giddiness: Secondary | ICD-10-CM

## 2021-12-09 LAB — URINALYSIS, ROUTINE W REFLEX MICROSCOPIC
Bilirubin Urine: NEGATIVE
Glucose, UA: NEGATIVE mg/dL
Hgb urine dipstick: NEGATIVE
Ketones, ur: NEGATIVE mg/dL
Leukocytes,Ua: NEGATIVE
Nitrite: NEGATIVE
Protein, ur: NEGATIVE mg/dL
Specific Gravity, Urine: 1.018 (ref 1.005–1.030)
pH: 7 (ref 5.0–8.0)

## 2021-12-09 LAB — CBC
HCT: 43.4 % (ref 36.0–46.0)
Hemoglobin: 14.5 g/dL (ref 12.0–15.0)
MCH: 33.6 pg (ref 26.0–34.0)
MCHC: 33.4 g/dL (ref 30.0–36.0)
MCV: 100.7 fL — ABNORMAL HIGH (ref 80.0–100.0)
Platelets: 314 10*3/uL (ref 150–400)
RBC: 4.31 MIL/uL (ref 3.87–5.11)
RDW: 11.9 % (ref 11.5–15.5)
WBC: 8.3 10*3/uL (ref 4.0–10.5)
nRBC: 0 % (ref 0.0–0.2)

## 2021-12-09 LAB — BASIC METABOLIC PANEL
Anion gap: 6 (ref 5–15)
BUN: 12 mg/dL (ref 6–20)
CO2: 29 mmol/L (ref 22–32)
Calcium: 9.5 mg/dL (ref 8.9–10.3)
Chloride: 103 mmol/L (ref 98–111)
Creatinine, Ser: 0.84 mg/dL (ref 0.44–1.00)
GFR, Estimated: >60 mL/min (ref >60)
Glucose, Bld: 96 mg/dL (ref 70–99)
Potassium: 3.8 mmol/L (ref 3.5–5.1)
Sodium: 138 mmol/L (ref 135–145)

## 2021-12-09 LAB — POC URINE PREG, ED: Preg Test, Ur: NEGATIVE

## 2021-12-09 MED ORDER — KETOROLAC TROMETHAMINE 60 MG/2ML IM SOLN
60.0000 mg | Freq: Once | INTRAMUSCULAR | Status: AC
Start: 2021-12-09 — End: 2021-12-09
  Administered 2021-12-09: 60 mg via INTRAMUSCULAR
  Filled 2021-12-09: qty 2

## 2021-12-09 MED ORDER — DEXAMETHASONE SODIUM PHOSPHATE 10 MG/ML IJ SOLN
10.0000 mg | Freq: Once | INTRAMUSCULAR | Status: AC
  Administered 2021-12-09: 10 mg via INTRAMUSCULAR
  Filled 2021-12-09: qty 1

## 2021-12-09 NOTE — ED Provider Notes (Signed)
? ?[facility] ?Provider Note ? ? ? Event Date/Time  ? First MD Initiated Contact with Patient 12/09/21 1543   ?  (approximate) ? ? ?History  ? ?Back Pain, Arm Pain, and Dizziness ? ? ?HPI ? ?Kendra [name] is a 31 y.o. female   with past medical history of anxiety, depression, seizures, here with left-sided shoulder pain and intermittent lightheadedness.  Patient recently underwent surgery for tubal ligation on Monday.  She has had chronic left shoulder/neck/arm pain, which has been ongoing for several months, and has been seen by cardiology and is currently scheduled for EMG with neurology.  They feel like it may be a paresthesia/neuropathy.  She states that since going under anesthesia, she has had worsening of the pain which is a sharp, stabbing, intermittent, transient sensation with occasional radiation of paresthesias down her arm.  It is the same distribution and quality of pain, which is slightly worse since his surgery.  She also states that she has felt mildly lightheaded, particularly with standing, since the surgery.  No vomiting.  She has been eating and drinking, does admit she was not drinking as much as usual.  No persistent dizziness.  No focal numbness or weakness.  No other complaints.  No significant abdominal pain or bleeding.  Surgical sites have not been painful. ? ?  ? ? ?Physical Exam  ? ?Triage Vital Signs: ?ED Triage Vitals  ?Enc Vitals Group  ?   BP 12/09/21 1427 140/81  ?   Pulse Rate 12/09/21 1427 81  ?   Resp 12/09/21 1427 18  ?   Temp 12/09/21 1427 98.4 ?F (36.9 ?C)  ?   Temp src --   ?   SpO2 12/09/21 1427 97 %  ?   Weight 12/09/21 1429 219 lb (99.3 kg)  ?   Height 12/09/21 1429 5\' 7"  (1.702 m)  ?   Head Circumference --   ?   Peak Flow --   ?   Pain Score 12/09/21 1428 8  ?   Pain Loc --   ?   Pain Edu? --   ?   Excl. in GC? --   ? ? ?Most recent vital signs: ?Vitals:  ? 12/09/21 1427 12/09/21 1703  ?BP: 140/81 124/69  ?Pulse: 81 81  ?Resp: 18 18  ?Temp:  98.4 ?F (36.9 ?C)   ?SpO2: 97% 100%  ? ? ? ?General: Awake, no distress.  ?CV:  Good peripheral perfusion.  Regular rate and rhythm.  No murmurs. ?Resp:  Normal effort.  Lungs clear bilaterally. ?Abd:  No distention.  No tenderness.  Surgical sites clean, dry, intact.  Small amount of bruising around umbilical port site.  No peritonitis. ?Other:  Mildly dry mucous membranes.  Strength 5 bilateral upper extremities.  Normal station light touch.  Minimal tenderness over the left superior trapezius, no deformity or bruising.  Radial pulses 2+ and symmetric. ? ? ?ED Results / Procedures / Treatments  ? ?Labs ?(all labs ordered are listed, but only abnormal results are displayed) ?Labs Reviewed  ?CBC - Abnormal; Notable for the following components:  ?    Result Value  ? MCV 100.7 (*)   ? All other components within normal limits  ?URINALYSIS, ROUTINE W REFLEX MICROSCOPIC - Abnormal; Notable for the following components:  ? Color, Urine YELLOW (*)   ? APPearance CLOUDY (*)   ? All other components within normal limits  ?BASIC METABOLIC PANEL  ?POC URINE PREG, ED  ?CBG MONITORING,  ED  ? ? ? ?EKG ?Normal sinus rhythm, rate 83.  PR 144, cures 90, QTc 434.  T wave inversions in 3, otherwise no acute ischemic changes. ? ? ?RADIOLOGY ?90 ? ? ?I also independently reviewed and agree with radiologist interpretations. ? ? ?PROCEDURES: ? ?Critical Care performed: No ? ? ?MEDICATIONS ORDERED IN ED: ?Medications  ?dexamethasone (DECADRON) injection 10 mg (10 mg Intramuscular Given 12/09/21 1646)  ?ketorolac (TORADOL) injection 60 mg (60 mg Intramuscular Given 12/09/21 1646)  ? ? ? ?IMPRESSION / MDM / ASSESSMENT AND PLAN / ED COURSE  ?I reviewed the triage vital signs and the nursing notes. ?             ?               ? ? ?Ddx:  ?Differential includes the following, with pertinent life- or limb-threatening emergencies considered: ? 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   ? ? ?MDM: Patient has had extensive cardiac evaluation.  Nonischemic EKG I do not suspect ACS.  She is not tachycardic, tachypneic, hypoxic, and this was an outpatient procedure, do not suspect PE or dissection.  She is vascularly intact distally.  Regarding her dizziness, suspect this is also related to her anesthesia.  She appears mildly hydrated.  Somewhat positional.  Will encourage p.o. hydration.  Screening lab work obtained, reviewed, and is overall very reassuring.  CBC shows no leukocytosis or anemia.  CMP with normal renal function and electrolytes.  Pregnancy negative.  Urinalysis shows no signs of UTI.  Abdomen soft, nontender, with no peritonitis or evidence to suggest referred pain from intra-abdominal source. ? ?We will treat with a dose of Decadron and Toradol for possible inflammation related to previous transient pressure related neuropraxia from her surgery, discharged with ongoing neurology follow-up and good return precautions. ? ? ? ?MEDICATIONS GIVEN IN ED: ?Medications  ?dexamethasone (DECADRON) injection 10 mg (10 mg Intramuscular Given 12/09/21 1646)  ?ketorolac (TORADOL) injection 60 mg (60 mg Intramuscular Given 12/09/21 1646)  ? ? ? ?Consults:  ?None ? ? ?EMR reviewed  ?Reviewed op notes, no significant complications noted ? ? ? ? ?FINAL CLINICAL IMPRESSION(S) / ED DIAGNOSES  ? ?Final diagnoses:  ?Neuropathy  ?Lightheadedness  ?Acute pain of left shoulder  ? ? ? ?Rx / DC Orders  ? ?ED Discharge Orders   ? ? None  ? ?  ? ? ? ?Note:  This document was prepared using Dragon voice recognition software and may include unintentional dictation errors. ?  ?Shaune Pollack, MD ?12/09/21 1843 ? ?

## 2021-12-09 NOTE — ED Triage Notes (Signed)
Pt to ED from home, POV. Pt complains of upper back pain between shoulder blades and L arm pain since 6 days ago after having tubal ligation. States that pain is sharp and sometimes feels "like something is catching".  ? ?Also complains of feeling weak since surgery. States feels dizzy (room not spinning) worse with position changes. States may not be drinking enough water.  ? ?Blue top sent with basic labs. ?

## 2021-12-09 NOTE — Discharge Instructions (Signed)
Continue follow-up with Neurology ? ?Try to drink at least 6-8 glasses of water daily ? ?Your labs were otherwise very reassuring, as well as your EKG. ?

## 2021-12-11 ENCOUNTER — Other Ambulatory Visit: Payer: Self-pay | Admitting: Obstetrics and Gynecology

## 2021-12-11 NOTE — Patient Outreach (Signed)
?Medicaid Managed Care   ?Nurse Care Manager Note ? ?12/11/2021 ?Name:  Kendra [name] MRN:  [id number] DOB:  October 16, 1990 ? ?Kendra [name] is an 31 y.o. year old female who is a primary patient of Kendra [name], [place].  The [facility] Managed Care Coordination team was consulted for assistance with:    ?Chronic healthcare management needs, tobacco use, anxiety, pain ? ?Kendra [name] was given information about Medicaid Managed Care Coordination team services today. Kendra [name] Patient agreed to services and verbal consent obtained. ? ?Engaged with patient by telephone for follow up visit in response to provider referral for case management and/or care coordination services.  ? ?Assessments/Interventions:  Review of past medical history, allergies, medications, health status, including review of consultants reports, laboratory and other test data, was performed as part of comprehensive evaluation and provision of chronic care management services. ? ?SDOH (Social Determinants of Health) assessments and interventions performed: ?SDOH Interventions   ? ?Flowsheet Row Most Recent Value  ?SDOH Interventions   ?Financial Strain Interventions Intervention Not Indicated  ?Physical Activity Interventions Intervention Not Indicated  ? ?  ?Care Plan ? ?No Known Allergies ? ?Medications Reviewed Today   ? ? Reviewed by [name], RN (Registered Nurse) on 12/11/21 at 1054  Med List Status: <None>  ? ?Medication Order Taking? Sig Documenting Provider Last Dose Status Informant  ?buprenorphine-naloxone (SUBOXONE) 8-2 mg SUBL SL tablet [id number]  Place 1 tablet under the tongue daily. Patient uses 8mg  film bid [provider]  Active Self  ?HYDROcodone-acetaminophen (NORCO/VICODIN) 5-325 MG tablet [id number]  Take 1-2 tablets by mouth every 6 (six) hours as needed for moderate pain. Harlin Heys, MD  Active   ?ibuprofen (ADVIL) 800 MG tablet [id number]  Take 1 tablet (800 mg total) by mouth every 8  (eight) hours as needed. Harlin Heys, MD  Active   ?omeprazole (PRILOSEC OTC) 20 MG tablet [id number]  Take 1 tablet (20 mg total) by mouth daily. Blake Divine, MD  Active   ?sertraline (ZOLOFT) 50 MG tablet [id number]  Take 50 mg by mouth daily. [provider]  Active   ?Vitamin D, Ergocalciferol, (DRISDOL) 1.25 MG (50000 UNIT) CAPS capsule [id number] Yes Take 50,000 Units by mouth every 7 (seven) days. [provider]  Active Self  ? ?  ?  ? ?  ? ?Patient Active Problem List  ? Diagnosis Date Noted  ? Noninfectious diarrhea   ? Non-intractable cyclical vomiting with nausea   ? Diarrhea   ? Anxiety and depression 01/19/2015  ? History of cannabis dependence/abuse ([place]) 01/19/2015  ? Tobacco abuse 01/19/2015  ? ?Conditions to be addressed/monitored per PCP order:  Chronic healthcare management needs, tobacco use, anxiety, pain ? ?Care Plan : RN Care Manager Plan of Care  ?Updates made by [name], RN since 12/11/2021 12:00 AM  ?  ? ?Problem: RNCM Effective Management of patient needs   ?Priority: High  ?  ? ?Long-Range Goal: Self-Management Plan Developed   ?Start Date: 10/11/2021  ?Expected End Date: 01/11/2022  ?Recent Progress: Not on track  ?Priority: High  ?Note:   ?Current Barriers:  ?Knowledge Deficits related to plan of care for management of Anxiety  and pain ?12/11/21:  patient with 16 ED visits since December-has found a new PCP at [facility] and is happy about that-feels this will assist in getting the help she needs.  Has started virtual therapy with Psychiatrist-next appt 6/28.  Orthopedic and Rheumatology appointments WNL  as well as nerve conduction test-has ongoing chest pain-no change-in addition to shoulder and abdominal pain-GI referral placed.  Continues on Suboxone and has decreased smoking to 1/4 ppd-praise given ? ?RNCM Clinical Goal(s):  ?Patient will verbalize understanding of plan for management of Anxiety as evidenced by taking prescribed  medications and continuing therapy ?take all medications exactly as prescribed and will call provider for medication related questions ?demonstrate understanding of rationale for each prescribed medication ?attend all scheduled medical appointments: ?continue to work with RN Care Manager to address care management and care coordination needs related to  Anxiety as evidenced by adherence to CM Team Scheduled appointments through collaboration with RN Care manager, provider, and care team.  ? ?Interventions: ?Inter-disciplinary care team collaboration (see longitudinal plan of care) ?Evaluation of current treatment plan related to  self management and patient's adherence to plan as established by provider ? ? (Status:  New goal.)  Long Term Goal ?Evaluation of current treatment plan related to Anxiety ?Discussed plans with patient for ongoing care management follow up and provided patient with direct contact information for care management team ?Evaluation of current treatment plan related to anxiety and patient's adherence to plan as established by provider ?Reviewed medications with patient ?Reviewed scheduled/upcoming provider appointments ?Discussed plans with patient for ongoing care management follow up and provided patient with direct contact information for care management team ?Screening for signs and symptoms of depression related to chronic disease state  ?Assessed social determinant of health barriers ?Collaborated with LCSW ?LCSW referral for therapy services-completed  ?Care Guide referral for PCP resources-completed ?Collaborated with Care Guide for resources. ? ?Patient Goals/Self-Care Activities: ?Take all medications as prescribed ?Attend all scheduled provider appointments ?Call pharmacy for medication refills 3-7 days in advance of running out of medications ?Perform all self care activities independently  ?Perform IADL's (shopping, preparing meals, housekeeping, managing finances)  independently ?Call provider office for new concerns or questions  ?Patient to schedule a new appt with PCP ? ?Follow Up Plan:  The care management team will reach out to the patient again over the next 30 days.  ? ?Long-Range Goal: Rstablish Plan of Care for Disease Management Needs   ?Start Date: 09/13/2021  ?Expected End Date: 12/11/2021  ?Priority: High  ?Note:   ?Timeframe:  Long-Range Goal ?Priority:  High ?Start Date:        09/13/21                     ?Expected End Date:      ongoing                ? ?Follow Up Date 01/11/22 ?  ?- schedule appointment for flu shot ?- schedule appointment for vaccines needed due to my age or health ?- schedule recommended health tests (blood work, mammogram, colonoscopy, pap test) ?- schedule and keep appointment for annual check-up  ?  ?Why is this important?   ?Screening tests can find diseases early when they are easier to treat.  ?Your doctor or nurse will talk with you about which tests are important for you.  ?Getting shots for common diseases like the flu and shingles will help prevent them.   ?12/11/21-recent appts with Orthopedist and Rheumatologist-WNL-nerve conduction WNL  ? ?Follow Up:  Patient agrees to Care Plan and Follow-up. ? ?Plan: The Managed Medicaid care management team will reach out to the patient again over the next 30 days. and The  Patient has been provided with contact information for the Managed  Medicaid care management team and has been advised to call with any health related questions or concerns. ? ?Date/time of next scheduled RN care management/care coordination outreach:  01/11/22 at 1030. ?

## 2021-12-11 NOTE — Patient Instructions (Signed)
?Hi Kendra [name]-great to speak with you today, I hope you have a wonderful day!! ? ?Kendra [name] was given information about Medicaid Managed Care team care coordination services as a part of their [facility] Medicaid benefit. [name] verbally consented to engagement with the [facility] Managed Care team.  ? ?If you are experiencing a medical emergency, please call 911 or report to your local emergency department or urgent care.  ? ?If you have a non-emergency medical problem during routine business hours, please contact your provider's office and ask to speak with a nurse.  ? ?For questions related to your [facility] health plan, please call: [phone] or go here:[email or web address] ? ?If you would like to schedule transportation through your [facility] plan, please call the following number at least 2 days in advance of your appointment: [phone]. ? You can also use the MTM portal or MTM mobile app to manage your rides. For the portal, please go to [email or web address] ? ?Call the Behavioral Health Crisis Line at [phone], at any time, 24 hours a day, 7 days a week. If you are in danger or need immediate medical attention call 911. ? ?If you would like help to quit smoking, call 1-800-QUIT-NOW ((320)601-0237) OR Espa?ol: 1-855-D?jelo-Ya (281)585-2524) o para m?s informaci?n haga clic aqu? or Text READY to 200-400 to register via text ? ?Ms. Negrette - following are the goals we discussed in your visit today:  ? Goals Addressed   ? ?Long-Range Goal: Rstablish Plan of Care for Disease Management Needs   ?Start Date: 09/13/2021  ?Expected End Date: 12/11/2021  ?Priority: High  ?Note:   ?Timeframe:  Long-Range Goal ?Priority:  High ?Start Date:        09/13/21                     ?Expected End Date:      ongoing                ? ?Follow Up Date 01/11/22 ?  ?- schedule appointment for flu shot ?- schedule appointment for vaccines needed due to my age or health ?- schedule recommended  health tests (blood work, mammogram, colonoscopy, pap test) ?- schedule and keep appointment for annual check-up  ?  ?Why is this important?   ?Screening tests can find diseases early when they are easier to treat.  ?Your doctor or nurse will talk with you about which tests are important for you.  ?Getting shots for common diseases like the flu and shingles will help prevent them.   ?12/11/21-recent appts with Orthopedist and Rheumatologist-WNL-nerve conduction WNL  ? ?Kendra [name] verbalizes understanding of instructions and care plan provided today and agrees to view in MyChart. Active MyChart status confirmed with Kendra [name].   ? ?The Managed Medicaid care management team will reach out to the Kendra [name] again over the next 30 days.  ?The  Kendra [name]  has been provided with contact information for the Managed Medicaid care management team and has been advised to call with any health related questions or concerns.  ? ?Kathi Der RN, BSN ?[place]  [facility] ?Care Management Coordinator - Managed Medicaid High Risk ?[phone] ?  ?Following is a copy of your plan of care:  ?Care Plan : RN Care Manager Plan of Care  ?Updates made by [name], RN since 12/11/2021 12:00 AM  ?  ? ?Problem: RNCM Effective Management of Kendra [name] needs   ?Priority: High  ?  ? ?Long-Range Goal: Self-Management Plan Developed   ?  Start Date: 10/11/2021  ?Expected End Date: 01/11/2022  ?Recent Progress: Not on track  ?Priority: High  ?Note:   ?Current Barriers:  ?Knowledge Deficits related to plan of care for management of Anxiety  and pain ?12/11/21:  Kendra [name] with 16 ED visits since December-has found a new PCP at [facility] and is happy about that-feels this will assist in getting the help she needs.  Has started virtual therapy with Psychiatrist-next appt 6/28.  Orthopedic and Rheumatology appointments WNL as well as nerve conduction test-has ongoing chest pain-no change-in addition to shoulder and abdominal  pain-GI referral placed.  Continues on Suboxone and has decreased smoking to 1/4 ppd-praise given ? ?RNCM Clinical Goal(s):  ?Kendra [name] will verbalize understanding of plan for management of Anxiety as evidenced by taking prescribed medications and continuing therapy ?take all medications exactly as prescribed and will call provider for medication related questions ?demonstrate understanding of rationale for each prescribed medication ?attend all scheduled medical appointments: ?continue to work with RN Care Manager to address care management and care coordination needs related to  Anxiety as evidenced by adherence to CM Team Scheduled appointments through collaboration with RN Care manager, provider, and care team.  ? ?Interventions: ?Inter-disciplinary care team collaboration (see longitudinal plan of care) ?Evaluation of current treatment plan related to  self management and Kendra [name]'s adherence to plan as established by provider ? ? (Status:  New goal.)  Long Term Goal ?Evaluation of current treatment plan related to Anxiety ?Discussed plans with Kendra [name] for ongoing care management follow up and provided Kendra [name] with direct contact information for care management team ?Evaluation of current treatment plan related to anxiety and Kendra [name]'s adherence to plan as established by provider ?Reviewed medications with Kendra [name] ?Reviewed scheduled/upcoming provider appointments ?Discussed plans with Kendra [name] for ongoing care management follow up and provided Kendra [name] with direct contact information for care management team ?Screening for signs and symptoms of depression related to chronic disease state  ?Assessed social determinant of health barriers ?Collaborated with LCSW ?LCSW referral for therapy services-completed  ?Care Guide referral for PCP resources-completed ?Collaborated with Care Guide for resources. ? ?Kendra [name] Goals/Self-Care Activities: ?Take all medications as prescribed ?Attend all scheduled provider  appointments ?Call pharmacy for medication refills 3-7 days in advance of running out of medications ?Perform all self care activities independently  ?Perform IADL's (shopping, preparing meals, housekeeping, managing finances) independently ?Call provider office for new concerns or questions  ?Kendra [name] to schedule a new appt with PCP ? ?Follow Up Plan:  The care management team will reach out to the Kendra [name] again over the next 30 days.  ? ?  ?

## 2021-12-12 ENCOUNTER — Encounter: Payer: Self-pay | Admitting: Obstetrics and Gynecology

## 2021-12-18 ENCOUNTER — Emergency Department
Admission: EM | Admit: 2021-12-18 | Discharge: 2021-12-18 | Disposition: A | Discharge status: Home / Self Care | Payer: Medicaid Other | Attending: Student in an Organized Health Care Education/Training Program | Admitting: Student in an Organized Health Care Education/Training Program

## 2021-12-18 ENCOUNTER — Other Ambulatory Visit: Payer: Self-pay

## 2021-12-18 ENCOUNTER — Ambulatory Visit (INDEPENDENT_AMBULATORY_CARE_PROVIDER_SITE_OTHER): Payer: Medicaid Other | Admitting: Obstetrics and Gynecology

## 2021-12-18 ENCOUNTER — Encounter: Payer: Self-pay | Admitting: Obstetrics and Gynecology

## 2021-12-18 ENCOUNTER — Encounter: Payer: Self-pay | Admitting: Intensive Care

## 2021-12-18 VITALS — BP 120/84 | HR 83 | Ht 67.0 in | Wt 227.5 lb

## 2021-12-18 DIAGNOSIS — Z77098 Contact with and (suspected) exposure to other hazardous, chiefly nonmedicinal, chemicals: Secondary | ICD-10-CM | POA: Diagnosis not present

## 2021-12-18 DIAGNOSIS — R42 Dizziness and giddiness: Secondary | ICD-10-CM | POA: Diagnosis present

## 2021-12-18 DIAGNOSIS — Z9889 Other specified postprocedural states: Secondary | ICD-10-CM

## 2021-12-18 DIAGNOSIS — Z87891 Personal history of nicotine dependence: Secondary | ICD-10-CM | POA: Diagnosis not present

## 2021-12-18 LAB — CBC
HCT: 42.4 % (ref 36.0–46.0)
Hemoglobin: 14.3 g/dL (ref 12.0–15.0)
MCH: 33.7 pg (ref 26.0–34.0)
MCHC: 33.7 g/dL (ref 30.0–36.0)
MCV: 100 fL (ref 80.0–100.0)
Platelets: 291 10*3/uL (ref 150–400)
RBC: 4.24 MIL/uL (ref 3.87–5.11)
RDW: 11.9 % (ref 11.5–15.5)
WBC: 8.7 10*3/uL (ref 4.0–10.5)
nRBC: 0 % (ref 0.0–0.2)

## 2021-12-18 LAB — BASIC METABOLIC PANEL
Anion gap: 7 (ref 5–15)
BUN: 15 mg/dL (ref 6–20)
CO2: 28 mmol/L (ref 22–32)
Calcium: 9.8 mg/dL (ref 8.9–10.3)
Chloride: 104 mmol/L (ref 98–111)
Creatinine, Ser: 0.92 mg/dL (ref 0.44–1.00)
GFR, Estimated: >60 mL/min (ref >60)
Glucose, Bld: 87 mg/dL (ref 70–99)
Potassium: 3.8 mmol/L (ref 3.5–5.1)
Sodium: 139 mmol/L (ref 135–145)

## 2021-12-18 NOTE — Progress Notes (Signed)
HPI:      [name] is a 31 y.o. G2P1011 who LMP was Patient's last menstrual period was 11/29/2021 (exact date).  Subjective:   She presents today approximately 2 weeks postop from laparoscopic tubal sterilization.  She reports she is doing well.  She has no complaints of pain, issues with eating, bowel movements or urination.  She has generally resumed normal activities.    Hx: The following portions of the patient's history were reviewed and updated as appropriate:             She  has a past medical history of Anxiety, Depression, GERD (gastroesophageal reflux disease), Headache, Heart murmur, History of cannabis abuse, Seizures (HCC), Tobacco abuse, and Vitamin D deficiency. She does not have any pertinent problems on file. She  has a past surgical history that includes Wisdom tooth extraction (2013); Colon surgery (2013); Colonoscopy with propofol (N/A, 12/14/2015); Esophagogastroduodenoscopy (egd) with propofol (N/A, 12/14/2015); and Laparoscopic tubal ligation (Bilateral, 12/03/2021). Her family history includes Congestive Heart Failure in her maternal grandfather; Dementia in her maternal grandmother; Depression in her mother; Diabetes in her maternal grandfather; Diverticulitis in her maternal grandmother. She  reports that she has been smoking cigarettes. She has a 8.00 pack-year smoking history. She has never used smokeless tobacco. She reports that she does not currently use drugs after having used the following drugs: Marijuana. She reports that she does not drink alcohol. She has a current medication list which includes the following prescription(s): buprenorphine-naloxone, ibuprofen, omeprazole, sertraline, and vitamin d (ergocalciferol). She has No Known Allergies.       Review of Systems:  Review of Systems  Constitutional: Denied constitutional symptoms, night sweats, recent illness, fatigue, fever, insomnia and weight loss.  Eyes: Denied eye symptoms, eye pain,  photophobia, vision change and visual disturbance.  Ears/Nose/Throat/Neck: Denied ear, nose, throat or neck symptoms, hearing loss, nasal discharge, sinus congestion and sore throat.  Cardiovascular: Denied cardiovascular symptoms, arrhythmia, chest pain/pressure, edema, exercise intolerance, orthopnea and palpitations.  Respiratory: Denied pulmonary symptoms, asthma, pleuritic pain, productive sputum, cough, dyspnea and wheezing.  Gastrointestinal: Denied, gastro-esophageal reflux, melena, nausea and vomiting.  Genitourinary: Denied genitourinary symptoms including symptomatic vaginal discharge, pelvic relaxation issues, and urinary complaints.  Musculoskeletal: Denied musculoskeletal symptoms, stiffness, swelling, muscle weakness and myalgia.  Dermatologic: Denied dermatology symptoms, rash and scar.  Neurologic: Denied neurology symptoms, dizziness, headache, neck pain and syncope.  Psychiatric: Denied psychiatric symptoms, anxiety and depression.  Endocrine: Denied endocrine symptoms including hot flashes and night sweats.   Meds:   Current Outpatient Medications on File Prior to Visit  Medication Sig Dispense Refill   buprenorphine-naloxone (SUBOXONE) 8-2 mg SUBL SL tablet Place 1 tablet under the tongue daily. Patient uses 8mg  film bid     ibuprofen (ADVIL) 800 MG tablet Take 1 tablet (800 mg total) by mouth every 8 (eight) hours as needed. 30 tablet 0   omeprazole (PRILOSEC OTC) 20 MG tablet Take 1 tablet (20 mg total) by mouth daily. 28 tablet 1   sertraline (ZOLOFT) 50 MG tablet Take 50 mg by mouth daily.     Vitamin D, Ergocalciferol, (DRISDOL) 1.25 MG (50000 UNIT) CAPS capsule Take 50,000 Units by mouth every 7 (seven) days.     No current facility-administered medications on file prior to visit.      Objective:     Vitals:   12/18/21 1533  BP: 120/84  Pulse: 83   Filed Weights   12/18/21 1533  Weight: 227 lb 8 oz (103.2  kg)               Abdomen: Soft.   Non-tender.  No masses.  No HSM.  Incision/s: Intact.  Healing well.  No erythema.  No drainage.             Assessment:    G2P1011 Patient Active Problem List   Diagnosis Date Noted   Noninfectious diarrhea    Non-intractable cyclical vomiting with nausea    Diarrhea    Anxiety and depression 01/19/2015   History of cannabis dependence/abuse (HCC) 01/19/2015   Tobacco abuse 01/19/2015     1. Postoperative state   2. Post-operative state     Patient with excellent recovery postop   Plan:            1.  May resume normal activities. Orders No orders of the defined types were placed in this encounter.   No orders of the defined types were placed in this encounter.     F/U  Return in about 3 months (around 03/20/2022).  Elonda Husky, M.D. 12/18/2021 4:20 PM

## 2021-12-18 NOTE — ED Triage Notes (Signed)
Patient reports she was giving her dog a flea bath and was smelling in all the chemicals and started feeling lightheaded like she could pass out and nauseas. Reports she still feels lightheaded. A&O x4 in triage

## 2021-12-18 NOTE — Progress Notes (Signed)
Patient presents today for BTL post op follow-up. She states no pain, bleeding or surgery concerns. Patient went to ED today after skin rash concerns due to giving her dog a bath. Patient states no other questions or concerns at this time.

## 2021-12-18 NOTE — ED Provider Notes (Signed)
[facility] Provider Note    Event Date/Time   First MD Initiated Contact with Patient 12/18/21 1340     (approximate)   History   Dizziness   HPI  Kendra [name] is a 31 y.o. female fairly healthy with a history of smoking and remote bronchitis presents to the ER for evaluation of dizziness and some irritated skin that occurred after she was washing her dog with flea and tick shampoo roughly 1 hour prior to arrival.  States that the smell started bothering her she was feeling lightheaded.  She did not ingest any of this shampoo.  She came to the ER for further evaluation.  No nausea or vomiting.  No blurry vision.  No chest pain or shortness of breath.     Physical Exam   Triage Vital Signs: ED Triage Vitals  Enc Vitals Group     BP 12/18/21 1324 127/77     Pulse Rate 12/18/21 1324 92     Resp 12/18/21 1324 16     Temp 12/18/21 1324 (!) 97.5 F (36.4 C)     Temp Source 12/18/21 1324 Oral     SpO2 12/18/21 1324 99 %     Weight 12/18/21 1320 220 lb (99.8 kg)     Height 12/18/21 1320 5\' 7"  (1.702 m)     Head Circumference --      Peak Flow --      Pain Score 12/18/21 1320 0     Pain Loc --      Pain Edu? --      Excl. in GC? --     Most recent vital signs: Vitals:   12/18/21 1324  BP: 127/77  Pulse: 92  Resp: 16  Temp: (!) 97.5 F (36.4 C)  SpO2: 99%     Constitutional: Alert  Eyes: Conjunctivae are normal.  Head: Atraumatic. Nose: No congestion/rhinnorhea. Mouth/Throat: Mucous membranes are moist.   Neck: Painless ROM.  Cardiovascular:   Good peripheral circulation.  No mg/r Respiratory: Normal respiratory effort.  No retractions.  Gastrointestinal: Soft and nontender.  Musculoskeletal:  no deformity Neurologic:  MAE spontaneously. No gross focal neurologic deficits are appreciated.  Skin:  Skin is warm, dry and intact. No rash noted. Psychiatric: Mood and affect are normal. Speech and behavior are normal.    ED Results  / Procedures / Treatments   Labs (all labs ordered are listed, but only abnormal results are displayed) Labs Reviewed  BASIC METABOLIC PANEL  CBC  URINALYSIS, ROUTINE W REFLEX MICROSCOPIC  CBG MONITORING, ED  POC URINE PREG, ED     EKG  ED ECG REPORT I, 12/20/21, the attending physician, personally viewed and interpreted this ECG.   Date: 12/18/2021  EKG Time: 13:28  Rate: 85  Rhythm: sinus  Axis: normal  Intervals:normal  ST&T Change: normal intervals, no stemi    RADIOLOGY    PROCEDURES:  Critical Care performed:   Procedures   MEDICATIONS ORDERED IN ED: Medications - No data to display   IMPRESSION / MDM / ASSESSMENT AND PLAN / ED COURSE  I reviewed the triage vital signs and the nursing notes.                              Differential diagnosis includes, but is not limited to, toxic inhalation, toxic ingestion, chemical irritation dermatitis  Presented to the ER for evaluation of symptoms as described above.  She clinically  looks very well appearing in no acute distress afebrile hemodynamically stable.  Her exam is reassuring.  I discussed case in consultation with poison control who reports no indication for further observation or clinical work-up at this time.  Discussed return precautions.  Patient agreeable to plan.  No additional concerns or questions.    FINAL CLINICAL IMPRESSION(S) / ED DIAGNOSES   Final diagnoses:  Dizziness  Exposure to chemical compounds     Rx / DC Orders   ED Discharge Orders     None        Note:  This document was prepared using Dragon voice recognition software and may include unintentional dictation errors.    [name], MD 12/18/21 1414

## 2021-12-18 NOTE — Discharge Instructions (Signed)
Poison control [phone].

## 2021-12-24 DIAGNOSIS — F17200 Nicotine dependence, unspecified, uncomplicated: Secondary | ICD-10-CM | POA: Insufficient documentation

## 2021-12-24 DIAGNOSIS — R0789 Other chest pain: Secondary | ICD-10-CM | POA: Diagnosis present

## 2021-12-24 DIAGNOSIS — K30 Functional dyspepsia: Secondary | ICD-10-CM | POA: Insufficient documentation

## 2021-12-24 NOTE — ED Triage Notes (Signed)
Pt arrives with c/o chest pain that started about 30 minutes ago. Pt endorses n/v and SOB. Pt denies fevers.

## 2021-12-25 ENCOUNTER — Emergency Department: Payer: Medicaid Other

## 2021-12-25 ENCOUNTER — Emergency Department
Admission: EM | Admit: 2021-12-25 | Discharge: 2021-12-25 | Disposition: A | Discharge status: Home / Self Care | Payer: Medicaid Other | Attending: Emergency Medicine | Admitting: Emergency Medicine

## 2021-12-25 ENCOUNTER — Other Ambulatory Visit: Payer: Self-pay

## 2021-12-25 DIAGNOSIS — R0789 Other chest pain: Secondary | ICD-10-CM

## 2021-12-25 DIAGNOSIS — K30 Functional dyspepsia: Secondary | ICD-10-CM

## 2021-12-25 LAB — TROPONIN I (HIGH SENSITIVITY)
Troponin I (High Sensitivity): <2 ng/L (ref <18)
Troponin I (High Sensitivity): <2 ng/L (ref <18)

## 2021-12-25 LAB — CBC
HCT: 38.8 % (ref 36.0–46.0)
Hemoglobin: 13.1 g/dL (ref 12.0–15.0)
MCH: 33.6 pg (ref 26.0–34.0)
MCHC: 33.8 g/dL (ref 30.0–36.0)
MCV: 99.5 fL (ref 80.0–100.0)
Platelets: 257 10*3/uL (ref 150–400)
RBC: 3.9 MIL/uL (ref 3.87–5.11)
RDW: 11.8 % (ref 11.5–15.5)
WBC: 9.4 10*3/uL (ref 4.0–10.5)
nRBC: 0 % (ref 0.0–0.2)

## 2021-12-25 LAB — BASIC METABOLIC PANEL
Anion gap: 8 (ref 5–15)
BUN: 10 mg/dL (ref 6–20)
CO2: 27 mmol/L (ref 22–32)
Calcium: 9.3 mg/dL (ref 8.9–10.3)
Chloride: 104 mmol/L (ref 98–111)
Creatinine, Ser: 0.67 mg/dL (ref 0.44–1.00)
GFR, Estimated: >60 mL/min (ref >60)
Glucose, Bld: 96 mg/dL (ref 70–99)
Potassium: 4 mmol/L (ref 3.5–5.1)
Sodium: 139 mmol/L (ref 135–145)

## 2021-12-25 LAB — D-DIMER, QUANTITATIVE: D-Dimer, Quant: <0.27 ug/mL-FEU (ref 0.00–0.50)

## 2021-12-25 MED ORDER — ALUM & MAG HYDROXIDE-SIMETH 200-200-20 MG/5ML PO SUSP
30.0000 mL | Freq: Once | ORAL | Status: AC
  Administered 2021-12-25: 30 mL via ORAL
  Filled 2021-12-25: qty 30

## 2021-12-25 MED ORDER — PANTOPRAZOLE SODIUM 40 MG PO TBEC
40.0000 mg | DELAYED_RELEASE_TABLET | Freq: Every day | ORAL | 1 refills | Status: DC
Start: 2021-12-25 — End: 2022-01-26

## 2021-12-25 MED ORDER — ONDANSETRON HCL 4 MG/2ML IJ SOLN
4.0000 mg | Freq: Once | INTRAMUSCULAR | Status: DC
  Filled 2021-12-25: qty 2

## 2021-12-25 MED ORDER — FAMOTIDINE IN NACL 20-0.9 MG/50ML-% IV SOLN
20.0000 mg | Freq: Once | INTRAVENOUS | Status: DC
Start: 2021-12-25 — End: 2021-12-25
  Filled 2021-12-25: qty 50

## 2021-12-25 NOTE — ED Notes (Signed)
Lab at the bedside 

## 2021-12-25 NOTE — ED Provider Notes (Signed)
[facility] Provider Note    Event Date/Time   First MD Initiated Contact with Patient 12/25/21 0008     (approximate)   History   Chest Pain   HPI  Kendra [name] is a 31 y.o. female with a history of anxiety, depression, GERD, cannabis use, smoking who presents for evaluation of chest pain.  Patient has been having similar chest pain for at least 6 months.  She reports similar chest pain on a daily basis.  The episode this evening was more severe which prompted her visit to the ER.  She describes the pain as sharp, located in the left upper chest associated with nausea, vomiting, and shortness of breath.  Patient has been seen in the ER several times for similar pain with negative work-ups.  Has been seen by cardiology with echo, Holter monitor, and stress test which were all unremarkable.  She is currently waiting for an appointment with GI for further evaluation.  Of note patient had a laparoscopic tubal ligation 3 weeks ago.  She denies any abdominal pain.  Denies any prior history of PE or DVT, leg pain or swelling, hemoptysis, or exogenous hormones.     Past Medical History:  Diagnosis Date   Anxiety    Depression    GERD (gastroesophageal reflux disease)    Headache    stress   Heart murmur    History of cannabis abuse    Seizures (HCC)    age 57. syncopal episode. Hit head. had seizure.    Tobacco abuse    Vitamin D deficiency     Past Surgical History:  Procedure Laterality Date   COLON SURGERY  2013   pt states "colonoscopy while awake"   COLONOSCOPY WITH PROPOFOL N/A 12/14/2015   Procedure: COLONOSCOPY WITH PROPOFOL;  Surgeon: Midge Minium, MD;  Location: [facility];  Service: Endoscopy;  Laterality: N/A;   ESOPHAGOGASTRODUODENOSCOPY (EGD) WITH PROPOFOL N/A 12/14/2015   Procedure: ESOPHAGOGASTRODUODENOSCOPY (EGD) WITH PROPOFOL;  Surgeon: Midge Minium, MD;  Location: [facility];  Service: Endoscopy;  Laterality: N/A;    LAPAROSCOPIC TUBAL LIGATION Bilateral 12/03/2021   Procedure: LAPAROSCOPIC TUBAL LIGATION;  Surgeon: Linzie Collin, MD;  Location: [facility];  Service: Gynecology;  Laterality: Bilateral;   WISDOM TOOTH EXTRACTION  2013     Physical Exam   Triage Vital Signs: ED Triage Vitals  Enc Vitals Group     BP 12/25/21 0003 (!) 141/87     Pulse Rate 12/25/21 0003 89     Resp 12/25/21 0003 15     Temp 12/25/21 0003 97.8 F (36.6 C)     Temp Source 12/25/21 0003 Oral     SpO2 12/25/21 0003 100 %     Weight 12/25/21 0000 220 lb (99.8 kg)     Height 12/25/21 0000 5\' 7"  (1.702 m)     Head Circumference --      Peak Flow --      Pain Score 12/25/21 0000 8     Pain Loc --      Pain Edu? --      Excl. in GC? --     Most recent vital signs: Vitals:   12/25/21 0003 12/25/21 0150  BP: (!) 141/87 119/78  Pulse: 89 85  Resp: 15 17  Temp: 97.8 F (36.6 C)   SpO2: 100% 100%     Constitutional: Alert and oriented. Well appearing and in no apparent distress. HEENT:      Head: Normocephalic  and atraumatic.         Eyes: Conjunctivae are normal. Sclera is non-icteric.       Mouth/Throat: Mucous membranes are moist.       Neck: Supple with no signs of meningismus. Cardiovascular: Regular rate and rhythm. No murmurs, gallops, or rubs. 2+ symmetrical distal pulses are present in all extremities.  Respiratory: Normal respiratory effort. Lungs are clear to auscultation bilaterally.  Gastrointestinal: Soft, non tender, and non distended with positive bowel sounds. No rebound or guarding. Genitourinary: No CVA tenderness. Musculoskeletal:  No edema, cyanosis, or erythema of extremities. Neurologic: Normal speech and language. Face is symmetric. Moving all extremities. No gross focal neurologic deficits are appreciated. Skin: Skin is warm, dry and intact. No rash noted. Psychiatric: Mood and affect are normal. Speech and behavior are normal.  ED Results / Procedures / Treatments   Labs (all  labs ordered are listed, but only abnormal results are displayed) Labs Reviewed  D-DIMER, QUANTITATIVE  BASIC METABOLIC PANEL  CBC  POC URINE PREG, ED  TROPONIN I (HIGH SENSITIVITY)  TROPONIN I (HIGH SENSITIVITY)     EKG  ED ECG REPORT I, [name], the attending physician, personally viewed and interpreted this ECG.  Normal sinus rhythm with a rate of 98, normal intervals, normal axis, no ST elevations or depressions.  RADIOLOGY I, [name], attending MD, have personally viewed and interpreted the images obtained during this visit as below:  Chest x-ray is negative for acute pathology   ___________________________________________________ Interpretation by Radiologist:  DG Chest 2 View  Result Date: 12/25/2021 CLINICAL DATA:  Chest pain, shortness of breath EXAM: CHEST - 2 VIEW COMPARISON:  11/27/2021 FINDINGS: Lungs are clear.  No pleural effusion or pneumothorax. The heart is normal in size. Visualized osseous structures are within normal limits. IMPRESSION: Normal chest radiographs. Electronically Signed   By: Charline Bills M.D.   On: 12/25/2021 00:23       PROCEDURES:  Critical Care performed: No  Procedures    IMPRESSION / MDM / ASSESSMENT AND PLAN / ED COURSE  I reviewed the triage vital signs and the nursing notes.  31 y.o. female with a history of anxiety, depression, GERD, cannabis use, smoking who presents for evaluation of chest pain.  With daily episodes of chest pain since December.  Has been seen in the ER several times for the same pain with negative work-ups including EKG, troponins, CT of the chest, abdomen pelvis.  Has been seen by cardiology with a negative Holter, echo, and stress test.  He is currently awaiting GI evaluation for possible gastrointestinal etiology of her symptoms.  On exam she is well-appearing in no distress with normal vital signs, normal work of breathing normal sats, lungs are clear to auscultation, abdomen is  soft and nontender.  Ddx: GERD/indigestion versus gastroparesis versus hiatal hernia versus pneumothorax versus pneumonia versus ACS.  Since patient is postop from 3 weeks ago PE is also a possibility although she has no tachypnea, tachycardia, or hypoxia.   Plan: EKG, troponin x2, chest x-ray, D-dimer, CBC, metabolic panel.  Patient placed on telemetry for monitoring of cardiorespiratory status.  We will give her Maalox, IV Pepcid and Zofran   MEDICATIONS GIVEN IN ED: Medications  famotidine (PEPCID) IVPB 20 mg premix (20 mg Intravenous Patient Refused/Not Given 12/25/21 0130)  ondansetron (ZOFRAN) injection 4 mg (4 mg Intravenous Patient Refused/Not Given 12/25/21 0130)  alum & mag hydroxide-simeth (MAALOX/MYLANTA) 200-200-20 MG/5ML suspension 30 mL (30 mLs Oral Given 12/25/21 0115)  ED COURSE: EKG and 2 high-sensitivity troponins with no signs of ischemia.  D-dimer is negative.  Chest x-ray with no acute findings.  Normal CBC and metabolic panel.  Patient remains extremely well-appearing with normal vital signs.  Patient declined IV Pepcid and Zofran.  We did discuss the importance of continuing to listen to her symptoms and continue to follow-up specially with her primary care doctor and GI for further evaluation.  Recommended return to the hospital for new or worsening chest pain.  In the meantime we will discharge her home on Protonix   Consults: None   EMR reviewed including records from her visit with the cardiologist for the same complaints    FINAL CLINICAL IMPRESSION(S) / ED DIAGNOSES   Final diagnoses:  Atypical chest pain  Indigestion     Rx / DC Orders   ED Discharge Orders          Ordered    pantoprazole (PROTONIX) 40 MG tablet  Daily        12/25/21 0349             Note:  This document was prepared using Dragon voice recognition software and may include unintentional dictation errors.   Please note:  Patient was evaluated in Emergency Department  today for the symptoms described in the history of present illness. Patient was evaluated in the context of the global COVID-19 pandemic, which necessitated consideration that the patient might be at risk for infection with the SARS-CoV-2 virus that causes COVID-19. Institutional protocols and algorithms that pertain to the evaluation of patients at risk for COVID-19 are in a state of rapid change based on information released by regulatory bodies including the CDC and federal and state organizations. These policies and algorithms were followed during the patient's care in the ED.  Some ED evaluations and interventions may be delayed as a result of limited staffing during the pandemic.       Don Perking, [place], MD 12/25/21 [phone]

## 2021-12-25 NOTE — ED Notes (Signed)
Lab called to draw repeat trop.

## 2021-12-25 NOTE — ED Notes (Signed)
Lab at the bedside to obtain ordered blood work

## 2021-12-25 NOTE — ED Notes (Signed)
IV attempt x 1 without success, pt tolerated well, however states she does not want the IV medication and requests that her blood just be drawn using a butterfly. After multiple attempts by primary RN, ED Tech, and Charge RN, lab is called and states someone will come to draw the labwork. Dr. Don Perking notified and aware.

## 2022-01-03 ENCOUNTER — Emergency Department: Payer: Medicaid Other

## 2022-01-03 ENCOUNTER — Other Ambulatory Visit: Payer: Self-pay

## 2022-01-03 ENCOUNTER — Other Ambulatory Visit: Payer: Self-pay | Admitting: Student in an Organized Health Care Education/Training Program

## 2022-01-03 DIAGNOSIS — F17200 Nicotine dependence, unspecified, uncomplicated: Secondary | ICD-10-CM | POA: Diagnosis not present

## 2022-01-03 DIAGNOSIS — R0789 Other chest pain: Secondary | ICD-10-CM | POA: Insufficient documentation

## 2022-01-03 LAB — URINALYSIS, ROUTINE W REFLEX MICROSCOPIC
Bilirubin Urine: NEGATIVE
Glucose, UA: NEGATIVE mg/dL
Hgb urine dipstick: NEGATIVE
Ketones, ur: NEGATIVE mg/dL
Leukocytes,Ua: NEGATIVE
Nitrite: NEGATIVE
Protein, ur: NEGATIVE mg/dL
Specific Gravity, Urine: 1.027 (ref 1.005–1.030)
pH: 5 (ref 5.0–8.0)

## 2022-01-03 LAB — CBC WITH DIFFERENTIAL/PLATELET
Abs Immature Granulocytes: 0.01 10*3/uL (ref 0.00–0.07)
Basophils Absolute: 0 10*3/uL (ref 0.0–0.1)
Basophils Relative: 0 %
Eosinophils Absolute: 0.1 10*3/uL (ref 0.0–0.5)
Eosinophils Relative: 1 %
HCT: 43.5 % (ref 36.0–46.0)
Hemoglobin: 14.5 g/dL (ref 12.0–15.0)
Immature Granulocytes: 0 %
Lymphocytes Relative: 29 %
Lymphs Abs: 2.2 10*3/uL (ref 0.7–4.0)
MCH: 32.9 pg (ref 26.0–34.0)
MCHC: 33.3 g/dL (ref 30.0–36.0)
MCV: 98.6 fL (ref 80.0–100.0)
Monocytes Absolute: 0.7 10*3/uL (ref 0.1–1.0)
Monocytes Relative: 9 %
Neutro Abs: 4.6 10*3/uL (ref 1.7–7.7)
Neutrophils Relative %: 61 %
Platelets: 313 10*3/uL (ref 150–400)
RBC: 4.41 MIL/uL (ref 3.87–5.11)
RDW: 11.8 % (ref 11.5–15.5)
WBC: 7.6 10*3/uL (ref 4.0–10.5)
nRBC: 0 % (ref 0.0–0.2)

## 2022-01-03 LAB — COMPREHENSIVE METABOLIC PANEL
ALT: 24 U/L (ref 0–44)
AST: 19 U/L (ref 15–41)
Albumin: 4.2 g/dL (ref 3.5–5.0)
Alkaline Phosphatase: 53 U/L (ref 38–126)
Anion gap: 5 (ref 5–15)
BUN: 13 mg/dL (ref 6–20)
CO2: 27 mmol/L (ref 22–32)
Calcium: 9.6 mg/dL (ref 8.9–10.3)
Chloride: 104 mmol/L (ref 98–111)
Creatinine, Ser: 0.81 mg/dL (ref 0.44–1.00)
GFR, Estimated: >60 mL/min (ref >60)
Glucose, Bld: 92 mg/dL (ref 70–99)
Potassium: 3.8 mmol/L (ref 3.5–5.1)
Sodium: 136 mmol/L (ref 135–145)
Total Bilirubin: 0.7 mg/dL (ref 0.3–1.2)
Total Protein: 7.6 g/dL (ref 6.5–8.1)

## 2022-01-03 LAB — TROPONIN I (HIGH SENSITIVITY)
Troponin I (High Sensitivity): 4 ng/L (ref <18)
Troponin I (High Sensitivity): 3 ng/L (ref <18)

## 2022-01-03 LAB — POC URINE PREG, ED: Preg Test, Ur: NEGATIVE

## 2022-01-03 LAB — LIPASE, BLOOD: Lipase: 24 U/L (ref 11–51)

## 2022-01-03 NOTE — ED Triage Notes (Signed)
Pt c/o chest pain, onset apx 6pm tonight. Reports N/V with pain radiating down arm. Sharp in nature. Pt reports being seen multiple times for same and keeps being dx with GERD but no improvement in sx.

## 2022-01-04 ENCOUNTER — Emergency Department
Admission: EM | Admit: 2022-01-04 | Discharge: 2022-01-04 | Disposition: A | Discharge status: Home / Self Care | Payer: Medicaid Other | Attending: Emergency Medicine | Admitting: Emergency Medicine

## 2022-01-04 DIAGNOSIS — R0789 Other chest pain: Secondary | ICD-10-CM

## 2022-01-04 MED ORDER — PREDNISONE 10 MG (21) PO TBPK: ORAL_TABLET | ORAL | 0 refills | Status: DC

## 2022-01-04 MED ORDER — KETOROLAC TROMETHAMINE 30 MG/ML IJ SOLN
60.0000 mg | Freq: Once | INTRAMUSCULAR | Status: AC
Start: 2022-01-04 — End: 2022-01-04
  Administered 2022-01-04: 60 mg via INTRAMUSCULAR
  Filled 2022-01-04: qty 2

## 2022-01-04 NOTE — Discharge Instructions (Signed)
You may take Tylenol 1000 mg every 6 hours as needed for pain. °

## 2022-01-04 NOTE — ED Notes (Signed)
E-signature pad unavailable - Pt verbalized understanding of D/C information - no additional concerns at this time.  

## 2022-01-04 NOTE — ED Provider Notes (Signed)
[facility] Provider Note    Event Date/Time   First MD Initiated Contact with Patient 01/04/22 0011     (approximate)   History   Chest Pain   HPI  Kendra [name] is a 31 y.o. female with history of GERD, depression, anxiety, chronic chest pain who presents to the emergency department with complaints of 6 months of left-sided sharp chest pain that feels like something is stabbing her in her chest.  She denies any aggravating factors but states symptoms improved with palpation of her chest and also with NSAIDs and steroids.  States she has seen a cardiologist and had a stress test and echocardiogram that she was told were normal.  She is scheduled to see a gastroenterologist in September.  She has tried to cut down on the amount of NSAID she takes because of her history of GERD.  She is on Protonix but states this is not improving her symptoms.  She states she is just tired of this pain and wants someone to give her an answer.  No history of PE or DVT and has had recent negative D-dimers.  Has also had multiple recent negative troponins.  States she has been to the ER "100 times for this".   History provided by patient.    Past Medical History:  Diagnosis Date   Anxiety    Depression    GERD (gastroesophageal reflux disease)    Headache    stress   Heart murmur    History of cannabis abuse    Seizures (HCC)    age 68. syncopal episode. Hit head. had seizure.    Tobacco abuse    Vitamin D deficiency     Past Surgical History:  Procedure Laterality Date   COLON SURGERY  2013   pt states "colonoscopy while awake"   COLONOSCOPY WITH PROPOFOL N/A 12/14/2015   Procedure: COLONOSCOPY WITH PROPOFOL;  Surgeon: Midge Minium, MD;  Location: [facility];  Service: Endoscopy;  Laterality: N/A;   ESOPHAGOGASTRODUODENOSCOPY (EGD) WITH PROPOFOL N/A 12/14/2015   Procedure: ESOPHAGOGASTRODUODENOSCOPY (EGD) WITH PROPOFOL;  Surgeon: Midge Minium, MD;   Location: [facility];  Service: Endoscopy;  Laterality: N/A;   LAPAROSCOPIC TUBAL LIGATION Bilateral 12/03/2021   Procedure: LAPAROSCOPIC TUBAL LIGATION;  Surgeon: Linzie Collin, MD;  Location: [facility];  Service: Gynecology;  Laterality: Bilateral;   WISDOM TOOTH EXTRACTION  2013    MEDICATIONS:  Prior to Admission medications   Medication Sig Start Date End Date Taking? Authorizing Provider  buprenorphine-naloxone (SUBOXONE) 8-2 mg SUBL SL tablet Place 1 tablet under the tongue daily. Patient uses 8mg  film bid    [provider]  ibuprofen (ADVIL) 800 MG tablet Take 1 tablet (800 mg total) by mouth every 8 (eight) hours as needed. 12/03/21   Linzie Collin, MD  pantoprazole (PROTONIX) 40 MG tablet Take 1 tablet (40 mg total) by mouth daily. 12/25/21 12/25/22  [name], MD  sertraline (ZOLOFT) 50 MG tablet Take 50 mg by mouth daily.    [provider]  Vitamin D, Ergocalciferol, (DRISDOL) 1.25 MG (50000 UNIT) CAPS capsule Take 50,000 Units by mouth every 7 (seven) days.    [provider]    Physical Exam   Triage Vital Signs: ED Triage Vitals  Enc Vitals Group     BP 01/03/22 2056 111/80     Pulse Rate 01/03/22 2056 71     Resp 01/03/22 2056 17     Temp 01/03/22 2056 98.2  F (36.8 C)     Temp Source 01/03/22 2056 Oral     SpO2 01/03/22 2056 98 %     Weight 01/03/22 2000 220 lb (99.8 kg)     Height 01/03/22 2000 5\' 7"  (1.702 m)     Head Circumference --      Peak Flow --      Pain Score 01/03/22 2000 9     Pain Loc --      Pain Edu? --      Excl. in GC? --     Most recent vital signs: Vitals:   01/03/22 2230 01/04/22 0031  BP: 132/89 133/78  Pulse: 90 88  Resp: 20 18  Temp:    SpO2: 100% 98%    CONSTITUTIONAL: Alert and oriented and responds appropriately to questions.  Tearful HEAD: Normocephalic, atraumatic EYES: Conjunctivae clear, pupils appear equal, sclera nonicteric ENT: normal nose; moist mucous  membranes NECK: Supple, normal ROM CARD: RRR; S1 and S2 appreciated; no murmurs, no clicks, no rubs, no gallops CHEST:  Chest wall is nontender to palpation but palpation of the left anterior chest wall improves her pain.  No crepitus, ecchymosis, erythema, warmth, rash or other lesions present.   RESP: Normal chest excursion without splinting or tachypnea; breath sounds clear and equal bilaterally; no wheezes, no rhonchi, no rales, no hypoxia or respiratory distress, speaking full sentences ABD/GI: Normal bowel sounds; non-distended; soft, non-tender, no rebound, no guarding, no peritoneal signs BACK: The back appears normal EXT: Normal ROM in all joints; no deformity noted, no edema; no cyanosis, no calf tenderness or calf swelling SKIN: Normal color for age and race; warm; no rash on exposed skin NEURO: Moves all extremities equally, normal speech PSYCH: The patient's mood and manner are appropriate.   ED Results / Procedures / Treatments   LABS: (all labs ordered are listed, but only abnormal results are displayed) Labs Reviewed  URINALYSIS, ROUTINE W REFLEX MICROSCOPIC - Abnormal; Notable for the following components:      Result Value   Color, Urine YELLOW (*)    APPearance CLOUDY (*)    All other components within normal limits  CBC WITH DIFFERENTIAL/PLATELET  COMPREHENSIVE METABOLIC PANEL  LIPASE, BLOOD  POC URINE PREG, ED  TROPONIN I (HIGH SENSITIVITY)     EKG:   Date: 01/03/2022  Rate: 88  Rhythm: normal sinus rhythm  QRS Axis: normal  Intervals: normal  ST/T Wave abnormalities: normal  Conduction Disutrbances: none  Narrative Interpretation: unremarkable     RADIOLOGY: My personal review and interpretation of imaging: Chest x-ray clear.  I have personally reviewed all radiology reports.   DG Chest 2 View  Result Date: 01/03/2022 CLINICAL DATA:  Chest pain EXAM: CHEST - 2 VIEW COMPARISON:  Radiograph 12/26/2018 FINDINGS: Cardiomediastinal silhouette is  within normal limits. There is no focal airspace disease. There is no pleural effusion. No pneumothorax. There is no acute osseous abnormality. IMPRESSION: No evidence of acute cardiopulmonary disease. Electronically Signed   By: 12/28/2018 M.D.   On: 01/03/2022 21:01     PROCEDURES:  Critical Care performed: No    Procedures    IMPRESSION / MDM / ASSESSMENT AND PLAN / ED COURSE  I reviewed the triage vital signs and the nursing notes.    Patient here with atypical chest pain that has been ongoing for 6 months.     DIFFERENTIAL DIAGNOSIS (includes but not limited to):   Chest wall pain, costochondritis, doubt ACS, PE, dissection, pneumonia, pneumothorax  Patient's presentation is most consistent with acute presentation with potential threat to life or bodily function.   PLAN: We will obtain EKG, CBC, BMP, troponin, chest x-ray.  Will give IM Toradol and reassess.   MEDICATIONS GIVEN IN ED: Medications  ketorolac (TORADOL) 30 MG/ML injection 60 mg (60 mg Intramuscular Given 01/04/22 0029)     ED COURSE: Patient's labs show no leukocytosis, normal hemoglobin, normal electrolytes.  Troponin negative.  Chest x-ray reviewed/interpreted by myself and radiologist and shows no infiltrate, edema pneumothorax.  EKG shows no new ischemic change.  Pain has improved with Toradol.  I attempted to reassure her that her work-up multiple times in the emergency department has not shown any acute emergent condition and that I think that her pain is more likely musculoskeletal in nature.  Given she feels NSAIDs have worsened her GERD, we will try a steroid taper as this has helped her previously to get a shot of Decadron.  I recommend close follow-up with her primary care doctor for continued pain management.  Patient verbalized understanding.   At this time, I do not feel there is any life-threatening condition present. I reviewed all nursing notes, vitals, pertinent previous records.  All lab  and urine results, EKGs, imaging ordered have been independently reviewed and interpreted by myself.  I reviewed all available radiology reports from any imaging ordered this visit.  Based on my assessment, I feel the patient is safe to be discharged home without further emergent workup and can continue workup as an outpatient as needed. Discussed all findings, treatment plan as well as usual and customary return precautions with patient.  They verbalize understanding and are comfortable with this plan.  Outpatient follow-up has been provided as needed.  All questions have been answered.    CONSULTS: Admission considered but given patient has no significant risk factors for ACS and this has a negative work-up today and multiple reassuring work-ups with recent negative troponins, D-dimer, will discharge home for outpatient management.   OUTSIDE RECORDS REVIEWED: Reviewed patient's last office visit with neurology on 11/12/2021 and with rheumatology on 11/01/2021.       FINAL CLINICAL IMPRESSION(S) / ED DIAGNOSES   Final diagnoses:  Atypical chest pain     Rx / DC Orders   ED Discharge Orders     None        Note:  This document was prepared using Dragon voice recognition software and may include unintentional dictation errors.   Joell Buerger, [name], DO 01/04/22 [phone]

## 2022-01-08 ENCOUNTER — Telehealth: Payer: Self-pay

## 2022-01-08 ENCOUNTER — Encounter: Payer: Medicaid Other | Admitting: Obstetrics and Gynecology

## 2022-01-08 DIAGNOSIS — Z9889 Other specified postprocedural states: Secondary | ICD-10-CM

## 2022-01-08 NOTE — Patient Outreach (Signed)
Care Coordination  01/08/2022  Kendra [name] 1990/10/13 [id number]  Transition Care Management Unsuccessful Follow-up Telephone Call  Date of discharge and from where:  01/07/22 [facility]   Attempts:  1st Attempt  Reason for unsuccessful TCM follow-up call:  Left voice message

## 2022-01-08 NOTE — Patient Instructions (Signed)
Visit Information  [name]  - as a part of your Medicaid benefit, you are eligible for care management and care coordination services at no cost or copay. I was unable to reach you by phone today but would be happy to help you with your health related needs. Please feel free to call me @ [facility] number).    Gus Puma, BSW, [facility]  [place]  High Risk Managed Medicaid Team  [phone]

## 2022-01-09 ENCOUNTER — Emergency Department
Admission: EM | Admit: 2022-01-09 | Discharge: 2022-01-09 | Disposition: A | Discharge status: Home / Self Care | Payer: Medicaid Other | Attending: Emergency Medicine | Admitting: Emergency Medicine

## 2022-01-09 ENCOUNTER — Emergency Department
Admission: EM | Admit: 2022-01-09 | Discharge: 2022-01-09 | Disposition: A | Discharge status: Home / Self Care | Payer: Medicaid Other | Source: Home / Self Care | Attending: Emergency Medicine | Admitting: Emergency Medicine

## 2022-01-09 ENCOUNTER — Emergency Department: Payer: Medicaid Other

## 2022-01-09 ENCOUNTER — Other Ambulatory Visit: Payer: Self-pay

## 2022-01-09 ENCOUNTER — Encounter: Payer: Self-pay | Admitting: *Deleted

## 2022-01-09 DIAGNOSIS — R29898 Other symptoms and signs involving the musculoskeletal system: Secondary | ICD-10-CM

## 2022-01-09 DIAGNOSIS — R55 Syncope and collapse: Secondary | ICD-10-CM | POA: Insufficient documentation

## 2022-01-09 DIAGNOSIS — R61 Generalized hyperhidrosis: Secondary | ICD-10-CM | POA: Insufficient documentation

## 2022-01-09 DIAGNOSIS — R110 Nausea: Secondary | ICD-10-CM | POA: Insufficient documentation

## 2022-01-09 DIAGNOSIS — R0789 Other chest pain: Secondary | ICD-10-CM | POA: Insufficient documentation

## 2022-01-09 DIAGNOSIS — R0602 Shortness of breath: Secondary | ICD-10-CM | POA: Insufficient documentation

## 2022-01-09 DIAGNOSIS — G8929 Other chronic pain: Secondary | ICD-10-CM | POA: Diagnosis not present

## 2022-01-09 DIAGNOSIS — F17210 Nicotine dependence, cigarettes, uncomplicated: Secondary | ICD-10-CM | POA: Diagnosis not present

## 2022-01-09 DIAGNOSIS — R531 Weakness: Secondary | ICD-10-CM | POA: Insufficient documentation

## 2022-01-09 LAB — CBC
HCT: 42.3 % (ref 36.0–46.0)
Hemoglobin: 14 g/dL (ref 12.0–15.0)
MCH: 33.3 pg (ref 26.0–34.0)
MCHC: 33.1 g/dL (ref 30.0–36.0)
MCV: 100.7 fL — ABNORMAL HIGH (ref 80.0–100.0)
Platelets: 304 10*3/uL (ref 150–400)
RBC: 4.2 MIL/uL (ref 3.87–5.11)
RDW: 11.8 % (ref 11.5–15.5)
WBC: 7.8 10*3/uL (ref 4.0–10.5)
nRBC: 0 % (ref 0.0–0.2)

## 2022-01-09 LAB — TROPONIN I (HIGH SENSITIVITY)
Troponin I (High Sensitivity): <2 ng/L (ref <18)
Troponin I (High Sensitivity): 3 ng/L (ref <18)

## 2022-01-09 LAB — CBG MONITORING, ED: Glucose-Capillary: 79 mg/dL (ref 70–99)

## 2022-01-09 LAB — BASIC METABOLIC PANEL
Anion gap: 5 (ref 5–15)
BUN: 11 mg/dL (ref 6–20)
CO2: 28 mmol/L (ref 22–32)
Calcium: 9.3 mg/dL (ref 8.9–10.3)
Chloride: 105 mmol/L (ref 98–111)
Creatinine, Ser: 0.7 mg/dL (ref 0.44–1.00)
GFR, Estimated: >60 mL/min (ref >60)
Glucose, Bld: 93 mg/dL (ref 70–99)
Potassium: 4 mmol/L (ref 3.5–5.1)
Sodium: 138 mmol/L (ref 135–145)

## 2022-01-09 LAB — D-DIMER, QUANTITATIVE: D-Dimer, Quant: <0.27 ug/mL-FEU (ref 0.00–0.50)

## 2022-01-09 LAB — PREGNANCY, URINE: Preg Test, Ur: NEGATIVE

## 2022-01-09 LAB — MAGNESIUM: Magnesium: 2.2 mg/dL (ref 1.7–2.4)

## 2022-01-09 MED ORDER — ALUM & MAG HYDROXIDE-SIMETH 200-200-20 MG/5ML PO SUSP
30.0000 mL | Freq: Once | ORAL | Status: AC
  Administered 2022-01-09: 30 mL via ORAL
  Filled 2022-01-09: qty 30

## 2022-01-09 MED ORDER — LIDOCAINE VISCOUS HCL 2 % MT SOLN
15.0000 mL | Freq: Once | OROMUCOSAL | Status: AC
  Administered 2022-01-09: 15 mL via ORAL
  Filled 2022-01-09: qty 15

## 2022-01-09 NOTE — ED Provider Notes (Signed)
[facility] Provider Note    Event Date/Time   First MD Initiated Contact with Patient 01/09/22 1329     (approximate)   History   Chest Pain   HPI  Kendra [name] is a 31 y.o. female   presents to the ED with complaint of left-sided chest pain that began this morning and radiates to her left shoulder.  Patient states that she has had similar episodes for the last 6 months and has been seen by cardiology who ruled out any cardiac etiology having had a echo and also a stress test.  Patient describes her pain as pressure which increases with deep inspiration.  She also has had some nausea, diaphoresis and shortness of breath.  Patient has history of GERD, anxiety, depression, cannabis dependency/abuse, cyclic vomiting and diarrhea.  Patient also smokes 1 pack cigarettes per day for the last 8 years.      Physical Exam   Triage Vital Signs: ED Triage Vitals  Enc Vitals Group     BP 01/09/22 1154 123/68     Pulse Rate 01/09/22 1154 84     Resp 01/09/22 1154 16     Temp 01/09/22 1154 98 F (36.7 C)     Temp Source 01/09/22 1154 Oral     SpO2 01/09/22 1154 98 %     Weight 01/09/22 1149 220 lb (99.8 kg)     Height 01/09/22 1149 5\' 7"  (1.702 m)     Head Circumference --      Peak Flow --      Pain Score 01/09/22 1149 7     Pain Loc --      Pain Edu? --      Excl. in GC? --     Most recent vital signs: Vitals:   01/09/22 1154 01/09/22 1744  BP: 123/68 122/88  Pulse: 84 75  Resp: 16 18  Temp: 98 F (36.7 C) 98.2 F (36.8 C)  SpO2: 98% 98%     General: Awake, no distress.  CV:  Good peripheral perfusion.  Regular rate and rhythm. Resp:  Normal effort.  Lungs are clear bilaterally. Abd:  No distention.  Other:  Minimal tenderness on palpation of the anterior chest wall on the left.  No tenderness on palpation of the abdomen and bowel sounds normoactive x4 quadrants.  Patient is ambulatory without any assistance.  No shortness of breath is  noted and patient is able to talk in complete sentences without any difficulty.   ED Results / Procedures / Treatments   Labs (all labs ordered are listed, but only abnormal results are displayed) Labs Reviewed  CBC - Abnormal; Notable for the following components:      Result Value   MCV 100.7 (*)    All other components within normal limits  BASIC METABOLIC PANEL  MAGNESIUM  D-DIMER, QUANTITATIVE  PREGNANCY, URINE  TROPONIN I (HIGH SENSITIVITY)  TROPONIN I (HIGH SENSITIVITY)     EKG  EKG shows sinus arrhythmia with sinus arrhythmia with a ventricular rate of 71, PR interval 150, QRS duration 88, QT/QTc 370/402.   RADIOLOGY Chest x-ray images were reviewed by myself and interpreted as negative.  Radiology report is negative for cardiopulmonary disease.    PROCEDURES:  Critical Care performed:   Procedures   MEDICATIONS ORDERED IN ED: Medications  alum & mag hydroxide-simeth (MAALOX/MYLANTA) 200-200-20 MG/5ML suspension 30 mL (30 mLs Oral Given 01/09/22 1742)    And  lidocaine (XYLOCAINE) 2 % viscous mouth solution  15 mL (15 mLs Oral Given 01/09/22 1742)     IMPRESSION / MDM / ASSESSMENT AND PLAN / ED COURSE  I reviewed the triage vital signs and the nursing notes.   Differential diagnosis includes, but is not limited to, chronic chest pain, chronic chest wall pain, osteochondritis, pulmonary embolus, hypomagnesia, lower extremity weakness etiology unknown.  ----------------------------------------- 3:56 PM on 01/09/2022 ----------------------------------------- Care of this patient is being turned over to Greig Right, PA-C as we are still waiting on results for a second troponin and D-dimer.  A GI cocktail was ordered for the patient to see if this helped relieved any of her pain.  Patient is stable and has been up ambulatory twice with her daughter to the bathroom.      Patient's presentation is most consistent with acute presentation with potential threat  to life or bodily function.  FINAL CLINICAL IMPRESSION(S) / ED DIAGNOSES   Final diagnoses:  Chronic chest wall pain  Weakness of both lower extremities     Rx / DC Orders   ED Discharge Orders     None        Note:  This document was prepared using Dragon voice recognition software and may include unintentional dictation errors.   [name], PA-C 01/10/22 1226    Gilles Chiquito, MD 01/11/22 1904

## 2022-01-09 NOTE — ED Triage Notes (Signed)
Pt was discharged from the ER a few minutes ago.  Pt states she passed out in her car and turned white.  Pt ambulated without diff to stat desk.  Pt alert, speech clear.

## 2022-01-09 NOTE — Discharge Instructions (Signed)
Follow-up with your regular doctor and your already scheduled appointments.  Continue take your Protonix.  He could also add Tums.  The emergency department can only do acute type testing.  With your symptoms it sounds like you need chronic testing which would go through the specialist.  However if you are worsening you can return emergency department.

## 2022-01-09 NOTE — Discharge Instructions (Addendum)
Your exam, labs, EKG, and work-up are normal and reassuring at this time.  Your symptoms may be due to to vasovagal episode or hypoglycemia.  You should continue to hydrate and drink small meals to prevent syncopal episodes.  Follow-up with your primary provider for ongoing symptoms.  Take any prescription medications provided today.

## 2022-01-09 NOTE — ED Notes (Signed)
Pt A&O, IV removed, pt given discharge instructions, pt ambulating with steady gait. 

## 2022-01-09 NOTE — ED Provider Notes (Signed)
[facility] Emergency Department Provider Note     Event Date/Time   First MD Initiated Contact with Patient 01/09/22 2049     (approximate)   History   Loss of Consciousness   HPI  Kendra [name] is a 31 y.o. female returns to the ED just a few minutes after being discharged, reporting that she apparently passed out in her car and turned white.  Patient had been in the ED for better than 6 hours for her work-up prior to arrival for this subsequent evaluation.  She also reports she had only eaten breakfast this morning and wonders if she was dehydrated or had hypoglycemia.  Patient is unescorted as she ambulated without difficulty to the triage desk to give report of her HPI.  Patient had been seen earlier in the ED with a complete work-up based on her complaints at that time. She initially presented to the ED with complaint of left-sided chest pain that began this morning and radiated to her left shoulder.  Patient states that she has had similar episodes for the last 6 months and has been seen by cardiology who ruled out any cardiac etiology, having had a echo and also a stress test.  Patient described her pain as pressure which increased with deep inspiration.  She also had some nausea, diaphoresis and shortness of breath.  Patient has history of GERD, anxiety, depression, cannabis dependency/abuse, cyclic vomiting and diarrhea.  Patient also smokes 1 pack cigarettes per day for the last 8 years.     Physical Exam   Triage Vital Signs: ED Triage Vitals [01/09/22 1833]  Enc Vitals Group     BP (!) 147/97     Pulse Rate 79     Resp 20     Temp 98 F (36.7 C)     Temp Source Oral     SpO2 99 %     Weight 74 lb 15.3 oz (34 kg)     Height 5\' 7"  (1.702 m)     Head Circumference      Peak Flow      Pain Score 0     Pain Loc      Pain Edu?      Excl. in GC?     Most recent vital signs: Vitals:   01/09/22 1833  BP: (!) 147/97  Pulse: 79  Resp:  20  Temp: 98 F (36.7 C)  SpO2: 99%    General Awake, no distress.  HEENT NCAT. PERRL. EOMI. No rhinorrhea. Mucous membranes are moist.  CV:  Good peripheral perfusion.  RESP:  Normal effort.  ABD:  No distention.    ED Results / Procedures / Treatments   Labs (all labs ordered are listed, but only abnormal results are displayed) Labs Reviewed  CBG MONITORING, ED    EKG  Vent. rate 72 BPM PR interval 154 ms QRS duration 96 ms QT/QTcB 380/416 ms P-R-T axes 35 55 2 Normal sinus No STEMI  RADIOLOGY  DG Lumbar Spine 2-3 Views  Result Date: 01/09/2022 CLINICAL DATA:  Weakness. EXAM: LUMBAR SPINE - 2-3 VIEW COMPARISON:  None Available. FINDINGS: Normal alignment of lumbar vertebral bodies. No loss of vertebral body height or disc height. No pars fracture. No subluxation. IMPRESSION: No fracture or dislocation. Electronically Signed   By: 01/11/2022 M.D.   On: 01/09/2022 17:44   DG Chest 2 View  Result Date: 01/09/2022 CLINICAL DATA:  Left chest pain EXAM: CHEST - 2 VIEW  COMPARISON:  01/03/2022 FINDINGS: The heart size and mediastinal contours are within normal limits. Both lungs are clear. The visualized skeletal structures are unremarkable. IMPRESSION: No active cardiopulmonary disease. Electronically Signed   By: Ernie Avena M.D.   On: 01/09/2022 12:45     PROCEDURES:  Critical Care performed: No  Procedures   MEDICATIONS ORDERED IN ED: Medications - No data to display   IMPRESSION / MDM / ASSESSMENT AND PLAN / ED COURSE  I reviewed the triage vital signs and the nursing notes.                              Differential diagnosis includes, but is not limited to, vasovagal syncope, hypoglycemia, dehydration, cardiac arrhythmia  Patient's presentation is most consistent with acute complicated illness / injury requiring diagnostic workup.  Patient's diagnosis is consistent with likely vasovagal syncopal episode. Patient will be discharged home with  directions to take her home medications. Patient is to follow up with primary provider as needed or otherwise directed. Patient is given ED precautions to return to the ED for any worsening or new symptoms.     FINAL CLINICAL IMPRESSION(S) / ED DIAGNOSES   Final diagnoses:  Syncope, unspecified syncope type     Rx / DC Orders   ED Discharge Orders     None        Note:  This document was prepared using Dragon voice recognition software and may include unintentional dictation errors.    Lissa Hoard, PA-C 01/09/22 2133    Phineas Semen, MD 01/09/22 [phone]

## 2022-01-09 NOTE — ED Triage Notes (Signed)
Pt to ED POV with daughter, pt has L side chest pain since this morning, radiates to L shoulder. Describes pain as pressure, also sharp with deep breath.  Pt endorses nausea and diaphoresis and SOB since this morning. Appears in NAD at this time, EKG being performed. Pt also complains of both legs feeling weak, "like I'm gonna collapse". Ambulatory to triage room.  States had normal EEG recently (2023) for syncopal episodes (which are chronic). Last syncopal episode about 5 months ago. Sees Dr Beatrix Fetters (cardiologist) for recurrent chest pain and syncopal episodes.

## 2022-01-09 NOTE — ED Provider Notes (Signed)
  Physical Exam  BP 123/68   Pulse 84   Temp 98 F (36.7 C) (Oral)   Resp 16   Ht 5\' 7"  (1.702 m)   Wt 99.8 kg   LMP 12/01/2021 (Exact Date) Comment: tubal ligation  SpO2 98%   BMI 34.46 kg/m   Physical Exam  Procedures  Procedures  ED Course / MDM    Medical Decision Making Amount and/or Complexity of Data Reviewed Labs: ordered. Radiology: ordered.  Risk OTC drugs. Prescription drug management.   Assuming care from 01/31/2022, PA-C.  We will be awaiting second troponin and D-dimer to assess patient's chest pain.  She was given a GI cocktail to see if it is more of an esophagitis.  First D-dimer is negative so truly do not feel that she has had an MI as this has been a chronic 49-month chest pain.  ----------------------------------------- 5:15 PM on 01/09/2022 ----------------------------------------- Patient is now complaining of the weakness in her legs and thinks she needs a lumbar spine x-ray.  X-ray lumbar spine ordered.  GI cocktail was just given.  Anticipate discharge after the x-ray.  Patient's labs are reassuring, x-ray of her lumbar spine interpreted by me as being negative, confirmed by radiology.  I feel some of this can be due to the patient's anxiety.  She is to follow-up with her regular specialist.  She has a specialist appointment with an ENT, cardiologist, and GI specialist.  I do not feel there is anything else we can do from the emergency department today.  Did explain this to her.  Told her this is a chronic problem and she does need to see the specialist.  Patient was discharged stable condition.     01/11/2022, PA-C 01/09/22 1753    01/11/22, MD 01/09/22 [phone]

## 2022-01-10 ENCOUNTER — Telehealth: Payer: Self-pay

## 2022-01-10 NOTE — Patient Outreach (Signed)
Care Coordination  01/10/2022  Kendra [name] 1991-04-27 [id number]  Transition Care Management Unsuccessful Follow-up Telephone Call  Date of discharge and from where:  [facility] 01/10/22  Attempts:  2nd Attempt  Reason for unsuccessful TCM follow-up call:  Left voice message

## 2022-01-11 ENCOUNTER — Other Ambulatory Visit: Payer: Self-pay | Admitting: Obstetrics and Gynecology

## 2022-01-11 NOTE — Patient Instructions (Signed)
Hi [name], I am sorry I missed you today, I hope you are doing okay - as a part of your Medicaid benefit, you are eligible for care management and care coordination services at no cost or copay. I was unable to reach you by phone today but would be happy to help you with your health related needs. Please feel free to call me at [phone].  A member of the Managed Medicaid care management team will reach out to you again over the next 30 business  days.   Kathi Der RN, BSN [place]  [employer] - Managed Medicaid High Risk [phone]

## 2022-01-11 NOTE — Patient Outreach (Signed)
Care Coordination  01/11/2022  Kendra [name] 11-Feb-1991 [id number]   Medicaid Managed Care   Unsuccessful Outreach Note  01/11/2022 Name: Kendra [name] MRN: [id number] DOB: 1991-05-16  Referred by: [name], PA Reason for referral : High Risk Managed Medicaid (Unsuccessful telephone outreach)   An unsuccessful telephone outreach was attempted today. The patient was referred to the case management team for assistance with care management and care coordination.   Follow Up Plan: The care management team will reach out to the patient again over the next 30 business  days.   Kathi Der RN, BSN   [employer] - Managed Medicaid High Risk [phone]

## 2022-01-13 ENCOUNTER — Encounter: Payer: Self-pay | Admitting: Emergency Medicine

## 2022-01-13 ENCOUNTER — Emergency Department
Admission: EM | Admit: 2022-01-13 | Discharge: 2022-01-13 | Disposition: A | Discharge status: Home / Self Care | Payer: Medicaid Other | Attending: Emergency Medicine | Admitting: Emergency Medicine

## 2022-01-13 ENCOUNTER — Other Ambulatory Visit: Payer: Self-pay

## 2022-01-13 ENCOUNTER — Emergency Department: Payer: Medicaid Other

## 2022-01-13 DIAGNOSIS — F17200 Nicotine dependence, unspecified, uncomplicated: Secondary | ICD-10-CM | POA: Diagnosis not present

## 2022-01-13 DIAGNOSIS — R079 Chest pain, unspecified: Secondary | ICD-10-CM

## 2022-01-13 DIAGNOSIS — R0789 Other chest pain: Secondary | ICD-10-CM | POA: Diagnosis present

## 2022-01-13 LAB — CBC
HCT: 42.6 % (ref 36.0–46.0)
Hemoglobin: 14.2 g/dL (ref 12.0–15.0)
MCH: 33.1 pg (ref 26.0–34.0)
MCHC: 33.3 g/dL (ref 30.0–36.0)
MCV: 99.3 fL (ref 80.0–100.0)
Platelets: 296 10*3/uL (ref 150–400)
RBC: 4.29 MIL/uL (ref 3.87–5.11)
RDW: 11.8 % (ref 11.5–15.5)
WBC: 6 10*3/uL (ref 4.0–10.5)
nRBC: 0 % (ref 0.0–0.2)

## 2022-01-13 LAB — BASIC METABOLIC PANEL
Anion gap: 7 (ref 5–15)
BUN: 10 mg/dL (ref 6–20)
CO2: 27 mmol/L (ref 22–32)
Calcium: 9.2 mg/dL (ref 8.9–10.3)
Chloride: 106 mmol/L (ref 98–111)
Creatinine, Ser: 0.84 mg/dL (ref 0.44–1.00)
GFR, Estimated: >60 mL/min (ref >60)
Glucose, Bld: 93 mg/dL (ref 70–99)
Potassium: 3.7 mmol/L (ref 3.5–5.1)
Sodium: 140 mmol/L (ref 135–145)

## 2022-01-13 LAB — TROPONIN I (HIGH SENSITIVITY): Troponin I (High Sensitivity): 2 ng/L (ref <18)

## 2022-01-13 NOTE — ED Notes (Signed)
Pt Dc to home.DC instructions reviewed with all questions answered. Pt verbalizes understanding. Pt ambulatory out of dept with steady gait.  

## 2022-01-13 NOTE — ED Provider Triage Note (Signed)
Emergency Medicine Provider Triage Evaluation Note  [name] , a 31 y.o. female  was evaluated in triage.  Pt complains of central chest pain and diaphoresis.  Similar symptoms multiple times recently with negative work-ups.  This episode started while she was watching television with her child then stood up to start walking and developed sharp midsternal pain.  She states that the pain caused her to fall on the floor.  Physical Exam  BP 117/74   Pulse 85   Temp 97.8 F (36.6 C) (Oral)   Resp 16   Ht 5\' 7"  (1.702 m)   Wt 34 kg   LMP 01/11/2022 (Exact Date)   SpO2 99%   BMI 11.74 kg/m  Gen:   Awake, no distress   Resp:  Normal effort  MSK:   Moves extremities without difficulty  Other:    Medical Decision Making  Medically screening exam initiated at 4:10 PM.  Appropriate orders placed.  LAKITHA GORDY was informed that the remainder of the evaluation will be completed by another provider, this initial triage assessment does not replace that evaluation, and the importance of remaining in the ED until their evaluation is complete.    [name], FNP 01/13/22 2146

## 2022-01-13 NOTE — ED Triage Notes (Signed)
Pt arrived via POV with reports CP, pt has hx of the same, states she sees cardiology at [facility].  Here for the same recently and seen at [facility].  Pt reports central chest pain, pt reports the pain comes and goes.  Pt states she was watching tv with child and stood up to walk and had sharp chest pain, pt reports blacking out as well.

## 2022-01-13 NOTE — ED Provider Notes (Signed)
[facility] Provider Note    Event Date/Time   First MD Initiated Contact with Patient 01/13/22 1653     (approximate)   History   Chest Pain   HPI  TAMESHIA BONNEVILLE is a 31 y.o. female past medical history of anxiety, depression, GERD who presents with chest pain.  Patient was walking in her living room when she started to have sharp CP.  She then became sweaty and syncopized.  Nuys dyspnea.  She feels back to baseline now.  Patient has been seen in the emergency department for several multiple times.  Last was seen on the 14th the 15th on the 16th.  Her work-up includes negative D-dimer is negative troponins negative x-ray negative EKGs.  Patient has seen cardiology has had an echo stress test and warm cardiac monitor and work-up has been unrevealing.  She is also following with neurology and rheumatology.  Has appointments with GI and ENT.  Patient is tearful in describing her symptoms.  She says she just does not know what to do when the symptoms come on she is fearful she is having a heart attack so comes to the emergency department.    Past Medical History:  Diagnosis Date   Anxiety    Depression    GERD (gastroesophageal reflux disease)    Headache    stress   Heart murmur    History of cannabis abuse    Seizures (HCC)    age 3. syncopal episode. Hit head. had seizure.    Tobacco abuse    Vitamin D deficiency     Patient Active Problem List   Diagnosis Date Noted   Noninfectious diarrhea    Non-intractable cyclical vomiting with nausea    Diarrhea    Anxiety and depression 01/19/2015   History of cannabis dependence/abuse (HCC) 01/19/2015   Tobacco abuse 01/19/2015     Physical Exam  Triage Vital Signs: ED Triage Vitals  Enc Vitals Group     BP 01/13/22 1547 117/74     Pulse Rate 01/13/22 1547 85     Resp 01/13/22 1547 16     Temp 01/13/22 1547 97.8 F (36.6 C)     Temp Source 01/13/22 1547 Oral     SpO2 01/13/22 1547 99 %      Weight 01/13/22 1604 74 lb 15.3 oz (34 kg)     Height 01/13/22 1604 5\' 7"  (1.702 m)     Head Circumference --      Peak Flow --      Pain Score 01/13/22 1604 8     Pain Loc --      Pain Edu? --      Excl. in GC? --     Most recent vital signs: Vitals:   01/13/22 1547 01/13/22 1747  BP: 117/74 (!) 121/59  Pulse: 85 81  Resp: 16 18  Temp: 97.8 F (36.6 C) 97.9 F (36.6 C)  SpO2: 99% 98%     General: Awake, no distress.  Tearful CV:  Good peripheral perfusion.  Resp:  Normal effort. Clear lungs Abd:  No distention.  Neuro:             Awake, Alert, Oriented x 3  Other:     ED Results / Procedures / Treatments  Labs (all labs ordered are listed, but only abnormal results are displayed) Labs Reviewed  BASIC METABOLIC PANEL  CBC  POC URINE PREG, ED  TROPONIN I (HIGH SENSITIVITY)  TROPONIN I (HIGH  SENSITIVITY)     EKG  EKG interpretation performed by myself: NSR, nml axis, nml intervals, no acute ischemic changes    RADIOLOGY I reviewed and interpreted the CXR which does not show any acute cardiopulmonary process    PROCEDURES:  Critical Care performed: No  Procedures   MEDICATIONS ORDERED IN ED: Medications - No data to display   IMPRESSION / MDM / ASSESSMENT AND PLAN / ED COURSE  I reviewed the triage vital signs and the nursing notes.                              Patient's presentation is most consistent with acute presentation with potential threat to life or bodily function.  Differential diagnosis includes, but is not limited to, anxiety, vasovagal episode, less likely acute coronary syndrome pulmonary embolism  Patient is a 31 year old female with long history of multiple ED visits for chest pain syncope and palpitations presenting today after syncopal episode that was preceded by sharp chest pain.  Of note patient seen in the ED 14/15 and 16th with negative D-dimer and negative troponin.  She has seen cardiology had echo stress test and wore  a cardiac monitor to follow with neurology.  Today her troponin is negative CBC BMP are reassuring and EKG is nonischemic chest x-ray is normal.  With her prior work-ups do not feel that further assessment for ACS or PE is necessary at this time.  Had a long discussion with the patient about her reassuring work-ups and about staying out of the emergency department if possible as risk of heart attack is quite low given her negative stress test and ongoing negative troponins.  She is going to see GI and ENT which I think will be helpful to further evaluate for other causes of chest pain.  Patient is seeing a therapist and psychiatrist which I was encouraged about as I do think there is a large component of anxiety.       FINAL CLINICAL IMPRESSION(S) / ED DIAGNOSES   Final diagnoses:  Chest pain, unspecified type     Rx / DC Orders   ED Discharge Orders     None        Note:  This document was prepared using Dragon voice recognition software and may include unintentional dictation errors.   [name], MD 01/13/22 [phone]

## 2022-01-14 ENCOUNTER — Telehealth: Payer: Self-pay | Admitting: Licensed Clinical Social Worker

## 2022-01-14 NOTE — Patient Outreach (Signed)
[facility] Care Management  01/14/2022  Kendra [name] August 27, 1990 [id number]   Transition Care Management Follow-up Telephone Call Date of discharge and from where: 01/13/22, [facility] ED How have you been since you were released from the [name]? She reports no difference in pain. Today she has an appointment with an ENT physician. She is looking forward to this appointment to gain answers as to why she is experiencing such pain currently. Any questions or concerns? No   Items Reviewed: Did the pt receive and understand the discharge instructions provided? Yes  Medications obtained and verified? No  Other? No  Any new allergies since your discharge? No  Dietary orders reviewed? No Do you have support at home? Yes    Home Care and Equipment/Supplies: Were home health services ordered? not applicable If so, what is the name of the agency? N/A  Has the agency set up a time to come to the patient's home? not applicable Were any new equipment or medical supplies ordered?  No What is the name of the medical supply agency? N/A Were you able to get the supplies/equipment? not applicable Do you have any questions related to the use of the equipment or supplies? No   Functional Questionnaire: (I = Independent and D = Dependent) ADLs: I   Bathing/Dressing- I   Meal Prep- I   Eating- I   Maintaining continence- I   Transferring/Ambulation- I   Managing Meds- I   Follow up appointments reviewed:   PCP [name] f/u appt confirmed? Yes. Patient will gain a new PCP at [facility] on 01/17/22 at her [name] f/u appointment. Specialist [name] f/u appt confirmed? Yes, Scheduled to see an ENT today for a procedure.  Are transportation arrangements needed? No  If their condition worsens, is the pt aware to call PCP or go to the Emergency Dept.? Yes Was the patient provided with contact information for the PCP's office or ED? Yes Was to pt  encouraged to call back with questions or concerns? Yes  Kendra [name], BSW, MSW, Kendra [name] Managed Medicaid LCSW Kendra [name]  [facility] [name] Phone: [phone]

## 2022-01-17 ENCOUNTER — Other Ambulatory Visit: Payer: Medicaid Other

## 2022-01-17 NOTE — Progress Notes (Deleted)
BH MD/PA/NP OP Progress Note  01/17/2022 12:58 PM Kendra [name]  MRN:  [id number]  Chief Complaint: No chief complaint on file.  HPI:  - she presented to ED at least a few times for chest pain. She had an episode of loss of conscious after being discharged from ED.     Visit Diagnosis: No diagnosis found.  Past Psychiatric History: Please see initial evaluation for full details. I have reviewed the history. No updates at this time.     Past Medical History:  Past Medical History:  Diagnosis Date   Anxiety    Depression    GERD (gastroesophageal reflux disease)    Headache    stress   Heart murmur    History of cannabis abuse    Seizures (HCC)    age 31. syncopal episode. Hit head. had seizure.    Tobacco abuse    Vitamin D deficiency     Past Surgical History:  Procedure Laterality Date   COLON SURGERY  2013   pt states "colonoscopy while awake"   COLONOSCOPY WITH PROPOFOL N/A 12/14/2015   Procedure: COLONOSCOPY WITH PROPOFOL;  Surgeon: Midge Minium, MD;  Location: [facility];  Service: Endoscopy;  Laterality: N/A;   ESOPHAGOGASTRODUODENOSCOPY (EGD) WITH PROPOFOL N/A 12/14/2015   Procedure: ESOPHAGOGASTRODUODENOSCOPY (EGD) WITH PROPOFOL;  Surgeon: Midge Minium, MD;  Location: [facility];  Service: Endoscopy;  Laterality: N/A;   LAPAROSCOPIC TUBAL LIGATION Bilateral 12/03/2021   Procedure: LAPAROSCOPIC TUBAL LIGATION;  Surgeon: Linzie Collin, MD;  Location: [facility];  Service: Gynecology;  Laterality: Bilateral;   WISDOM TOOTH EXTRACTION  2013    Family Psychiatric History: Please see initial evaluation for full details. I have reviewed the history. No updates at this time.     Family History:  Family History  Problem Relation Age of Onset   Depression Mother    Diabetes Maternal Grandfather    Congestive Heart Failure Maternal Grandfather    Diverticulitis Maternal Grandmother    Dementia Maternal Grandmother     Social History:  Social  History   Socioeconomic History   Marital status: Married    Spouse name: Not on file   Number of children: Not on file   Years of education: Not on file   Highest education level: Not on file  Occupational History   Not on file  Tobacco Use   Smoking status: Every Day    Packs/day: 1.00    Years: 8.00    Total pack years: 8.00    Types: Cigarettes   Smokeless tobacco: Never  Vaping Use   Vaping Use: Never used  Substance and Sexual Activity   Alcohol use: No   Drug use: Not Currently    Types: Marijuana   Sexual activity: Yes    Birth control/protection: Surgical    Comment: BTL  Other Topics Concern   Not on file  Social History Narrative   Not on file   Social Determinants of Health   Financial Resource Strain: Low Risk  (12/11/2021)   Overall Financial Resource Strain (CARDIA)    Difficulty of Paying Living Expenses: Not very hard  Food Insecurity: No Food Insecurity (10/11/2021)   Hunger Vital Sign    Worried About Running Out of Food in the Last Year: Never true    Ran Out of Food in the Last Year: Never true  Transportation Needs: No Transportation Needs (09/13/2021)   PRAPARE - Administrator, Civil Service (Medical): No  Lack of Transportation (Non-Medical): No  Physical Activity: Sufficiently Active (12/11/2021)   Exercise Vital Sign    Days of Exercise per Week: 5 days    Minutes of Exercise per Session: 60 min  Stress: No Stress Concern Present (11/07/2021)   [employer] of Occupational Health - Occupational Stress Questionnaire    Feeling of Stress : Only a little  Social Connections: Not on file    Allergies: No Known Allergies  Metabolic Disorder Labs: No results found for: "HGBA1C", "MPG" No results found for: "PROLACTIN" No results found for: "CHOL", "TRIG", "HDL", "CHOLHDL", "VLDL", "LDLCALC" Lab Results  Component Value Date   TSH 2.583 09/16/2021   TSH 2.813 07/10/2021    Therapeutic Level Labs: No results found for:  "LITHIUM" No results found for: "VALPROATE" No results found for: "CBMZ"  Current Medications: Current Outpatient Medications  Medication Sig Dispense Refill   buprenorphine-naloxone (SUBOXONE) 8-2 mg SUBL SL tablet Place 1 tablet under the tongue daily. Patient uses 8mg  film bid     ibuprofen (ADVIL) 800 MG tablet Take 1 tablet (800 mg total) by mouth every 8 (eight) hours as needed. 30 tablet 0   pantoprazole (PROTONIX) 40 MG tablet Take 1 tablet (40 mg total) by mouth daily. 30 tablet 1   predniSONE (STERAPRED UNI-PAK 21 TAB) 10 MG (21) TBPK tablet Take as directed 21 tablet 0   sertraline (ZOLOFT) 50 MG tablet Take 50 mg by mouth daily.     Vitamin D, Ergocalciferol, (DRISDOL) 1.25 MG (50000 UNIT) CAPS capsule Take 50,000 Units by mouth every 7 (seven) days.     No current facility-administered medications for this visit.     Musculoskeletal: Strength & Muscle Tone:  N/A Gait & Station:  N/A Patient leans: N/A  Psychiatric Specialty Exam: Review of Systems  Last menstrual period 01/11/2022.There is no height or weight on file to calculate BMI.  General Appearance: {Appearance:22683}  Eye Contact:  {[facility] EYE CONTACT:22684}  Speech:  Clear and Coherent  Volume:  Normal  Mood:  {[facility] MOOD:22306}  Affect:  {Affect (PAA):22687}  Thought Process:  Coherent  Orientation:  Full (Time, Place, and Person)  Thought Content: Logical   Suicidal Thoughts:  {ST/HT (PAA):22692}  Homicidal Thoughts:  {ST/HT (PAA):22692}  Memory:  Immediate;   Good  Judgement:  {Judgement (PAA):22694}  Insight:  {Insight (PAA):22695}  Psychomotor Activity:  Normal  Concentration:  Concentration: Good and Attention Span: Good  Recall:  Good  Fund of Knowledge: Good  Language: Good  Akathisia:  No  Handed:  Right  AIMS (if indicated): not done  Assets:  Communication Skills Desire for Improvement  ADL's:  Intact  Cognition: WNL  Sleep:  {[facility] GOOD/FAIR/POOR:22877}   Screenings: PHQ2-9     Flowsheet Row Video Visit from 12/05/2021 in [facility] Patient Outreach Telephone from 10/15/2021 in [facility]  PHQ-2 Total Score 1 4  PHQ-9 Total Score -- 10      Flowsheet Row ED from 01/13/2022 in [facility] Most recent reading at 01/13/2022  4:05 PM ED from 01/09/2022 in [facility] Most recent reading at 01/09/2022  6:34 PM ED from 01/09/2022 in [facility] Most recent reading at 01/09/2022 11:52 AM  C-SSRS RISK CATEGORY No Risk No Risk No Risk        Assessment and Plan:  Kendra [name] is a 31 y.o. year old female with a history of depression,  anxiety, history of opioid dependence on Suboxone, seizure, cervical spondylosis, r/o fibromyalgia,, who presents for follow up appointment for below.    1. MDD (major depressive disorder), recurrent, in partial remission (HCC) 2. Anxiety state She reports overall improvement in her mood symptoms despite ongoing somatic symptoms of chest pain, dizziness since being started on sertraline.  Noted that she thinks her somatic symptoms has worsened since she had COVID in January.  She denies any significant psychosocial stressors otherwise, and reports great relationship with her daughter, her family, including her parents and grandmother.  Will continue current dose of sertraline at this time given it has been beneficial for depression and anxiety.  Although she does have health anxiety in relation to her somatic symptoms, it is not to the extent of interfering with her daily activity at this time.  Will continue to monitor this.    # opioid dependence on Suboxone She has been on Suboxone for heroin dependence, prescribed at [facility].  She has been abstinent for the past 4 years without any craving.  Will continue motivational interview.     Plan Continue sertraline 50 mg daily  Next appointment: 6/28 at 11 AM for 30 mins, video   The patient demonstrates the following risk factors for suicide: Chronic risk factors for suicide include: psychiatric disorder of depression . Acute risk factors for suicide include: N/A. Protective factors for this patient include: positive social support, responsibility to others (children, family), and hope for the future. Considering these factors, the overall suicide risk at this point appears to be low. Patient is appropriate for outpatient follow up.         Collaboration of Care: Collaboration of Care: {BH OP Collaboration of Care:21014065}  Patient/Guardian was advised Release of Information must be obtained prior to any record release in order to collaborate their care with an outside provider. Patient/Guardian was advised if they have not already done so to contact the registration department to sign all necessary forms in order for [place] to release information regarding their care.   Consent: Patient/Guardian gives verbal consent for treatment and assignment of benefits for services provided during this visit. Patient/Guardian expressed understanding and agreed to proceed.    Neysa Hotter, MD 01/17/2022, 12:58 PM

## 2022-01-20 ENCOUNTER — Emergency Department
Admission: EM | Admit: 2022-01-20 | Discharge: 2022-01-20 | Disposition: A | Discharge status: Home / Self Care | Payer: Medicaid Other | Attending: Emergency Medicine | Admitting: Emergency Medicine

## 2022-01-20 ENCOUNTER — Emergency Department: Payer: Medicaid Other

## 2022-01-20 ENCOUNTER — Other Ambulatory Visit: Payer: Self-pay

## 2022-01-20 DIAGNOSIS — F17200 Nicotine dependence, unspecified, uncomplicated: Secondary | ICD-10-CM | POA: Insufficient documentation

## 2022-01-20 DIAGNOSIS — Z720 Tobacco use: Secondary | ICD-10-CM

## 2022-01-20 DIAGNOSIS — R079 Chest pain, unspecified: Secondary | ICD-10-CM

## 2022-01-20 DIAGNOSIS — R072 Precordial pain: Secondary | ICD-10-CM | POA: Insufficient documentation

## 2022-01-20 DIAGNOSIS — R42 Dizziness and giddiness: Secondary | ICD-10-CM | POA: Diagnosis not present

## 2022-01-20 LAB — COMPREHENSIVE METABOLIC PANEL
ALT: 15 U/L (ref 0–44)
AST: 16 U/L (ref 15–41)
Albumin: 4.3 g/dL (ref 3.5–5.0)
Alkaline Phosphatase: 59 U/L (ref 38–126)
Anion gap: 5 (ref 5–15)
BUN: 9 mg/dL (ref 6–20)
CO2: 29 mmol/L (ref 22–32)
Calcium: 9.9 mg/dL (ref 8.9–10.3)
Chloride: 108 mmol/L (ref 98–111)
Creatinine, Ser: 0.83 mg/dL (ref 0.44–1.00)
GFR, Estimated: >60 mL/min (ref >60)
Glucose, Bld: 98 mg/dL (ref 70–99)
Potassium: 4.6 mmol/L (ref 3.5–5.1)
Sodium: 142 mmol/L (ref 135–145)
Total Bilirubin: 0.8 mg/dL (ref 0.3–1.2)
Total Protein: 7.6 g/dL (ref 6.5–8.1)

## 2022-01-20 LAB — CBC WITH DIFFERENTIAL/PLATELET
Abs Immature Granulocytes: 0.01 10*3/uL (ref 0.00–0.07)
Basophils Absolute: 0 10*3/uL (ref 0.0–0.1)
Basophils Relative: 0 %
Eosinophils Absolute: 0.1 10*3/uL (ref 0.0–0.5)
Eosinophils Relative: 1 %
HCT: 41.8 % (ref 36.0–46.0)
Hemoglobin: 14.1 g/dL (ref 12.0–15.0)
Immature Granulocytes: 0 %
Lymphocytes Relative: 28 %
Lymphs Abs: 1.9 10*3/uL (ref 0.7–4.0)
MCH: 33.3 pg (ref 26.0–34.0)
MCHC: 33.7 g/dL (ref 30.0–36.0)
MCV: 98.6 fL (ref 80.0–100.0)
Monocytes Absolute: 0.6 10*3/uL (ref 0.1–1.0)
Monocytes Relative: 9 %
Neutro Abs: 4.3 10*3/uL (ref 1.7–7.7)
Neutrophils Relative %: 62 %
Platelets: 261 10*3/uL (ref 150–400)
RBC: 4.24 MIL/uL (ref 3.87–5.11)
RDW: 11.5 % (ref 11.5–15.5)
WBC: 6.8 10*3/uL (ref 4.0–10.5)
nRBC: 0 % (ref 0.0–0.2)

## 2022-01-20 LAB — TROPONIN I (HIGH SENSITIVITY): Troponin I (High Sensitivity): <2 ng/L (ref <18)

## 2022-01-20 LAB — D-DIMER, QUANTITATIVE: D-Dimer, Quant: <0.27 ug/mL-FEU (ref 0.00–0.50)

## 2022-01-20 LAB — POC URINE PREG, ED: Preg Test, Ur: NEGATIVE

## 2022-01-20 MED ORDER — ACETAMINOPHEN 500 MG PO TABS
1000.0000 mg | ORAL_TABLET | Freq: Once | ORAL | Status: AC
  Administered 2022-01-20: 1000 mg via ORAL
  Filled 2022-01-20: qty 2

## 2022-01-20 MED ORDER — LACTATED RINGERS IV BOLUS: 1000.0000 mL | Freq: Once | INTRAVENOUS | Status: DC

## 2022-01-22 ENCOUNTER — Encounter: Payer: Medicaid Other | Admitting: Obstetrics and Gynecology

## 2022-01-22 ENCOUNTER — Encounter: Payer: Self-pay | Admitting: Obstetrics and Gynecology

## 2022-01-22 DIAGNOSIS — Z9889 Other specified postprocedural states: Secondary | ICD-10-CM

## 2022-01-22 DIAGNOSIS — Z7689 Persons encountering health services in other specified circumstances: Secondary | ICD-10-CM | POA: Insufficient documentation

## 2022-01-23 ENCOUNTER — Ambulatory Visit (INDEPENDENT_AMBULATORY_CARE_PROVIDER_SITE_OTHER): Payer: Medicaid Other | Admitting: Obstetrics and Gynecology

## 2022-01-23 ENCOUNTER — Encounter: Payer: Self-pay | Admitting: Obstetrics and Gynecology

## 2022-01-23 ENCOUNTER — Telehealth: Payer: Medicaid Other | Admitting: Psychiatry

## 2022-01-23 ENCOUNTER — Telehealth: Payer: Self-pay | Admitting: Psychiatry

## 2022-01-23 VITALS — BP 118/77 | HR 73 | Ht 67.0 in | Wt 228.0 lb

## 2022-01-23 DIAGNOSIS — Z9889 Other specified postprocedural states: Secondary | ICD-10-CM

## 2022-01-23 NOTE — Progress Notes (Signed)
Patient presents for 6 week post-op visit post BTL. She states no pain, slight cramping and no bleeding. No questions or concerns at this time.

## 2022-01-23 NOTE — Progress Notes (Signed)
HPI:      [name] is a 31 y.o. G2P1011 who LMP was Patient's last menstrual period was 01/11/2022 (approximate).  Subjective:   She presents today 6 weeks postop from permanent sterilization.  She reports she is doing well.  Has had intercourse without issue.  Reports no abdominal pain. She continues to work-up for possible acid reflux.    Hx: The following portions of the patient's history were reviewed and updated as appropriate:             She  has a past medical history of Anxiety, Depression, GERD (gastroesophageal reflux disease), Headache, Heart murmur, History of cannabis abuse, Seizures (HCC), Tobacco abuse, and Vitamin D deficiency. She does not have any pertinent problems on file. She  has a past surgical history that includes Wisdom tooth extraction (2013); Colon surgery (2013); Colonoscopy with propofol (N/A, 12/14/2015); Esophagogastroduodenoscopy (egd) with propofol (N/A, 12/14/2015); and Laparoscopic tubal ligation (Bilateral, 12/03/2021). Her family history includes Congestive Heart Failure in her maternal grandfather; Dementia in her maternal grandmother; Depression in her mother; Diabetes in her maternal grandfather; Diverticulitis in her maternal grandmother. She  reports that she has been smoking cigarettes. She has a 8.00 pack-year smoking history. She has never used smokeless tobacco. She reports that she does not currently use drugs after having used the following drugs: Marijuana. She reports that she does not drink alcohol. She has a current medication list which includes the following prescription(s): buprenorphine-naloxone, ibuprofen, pantoprazole, sertraline, and vitamin d (ergocalciferol). She has No Known Allergies.       Review of Systems:  Review of Systems  Constitutional: Denied constitutional symptoms, night sweats, recent illness, fatigue, fever, insomnia and weight loss.  Eyes: Denied eye symptoms, eye pain, photophobia, vision change and visual  disturbance.  Ears/Nose/Throat/Neck: Denied ear, nose, throat or neck symptoms, hearing loss, nasal discharge, sinus congestion and sore throat.  Cardiovascular: Denied cardiovascular symptoms, arrhythmia, chest pain/pressure, edema, exercise intolerance, orthopnea and palpitations.  Respiratory: Denied pulmonary symptoms, asthma, pleuritic pain, productive sputum, cough, dyspnea and wheezing.  Gastrointestinal: Denied, gastro-esophageal reflux, melena, nausea and vomiting.  Genitourinary: Denied genitourinary symptoms including symptomatic vaginal discharge, pelvic relaxation issues, and urinary complaints.  Musculoskeletal: Denied musculoskeletal symptoms, stiffness, swelling, muscle weakness and myalgia.  Dermatologic: Denied dermatology symptoms, rash and scar.  Neurologic: Denied neurology symptoms, dizziness, headache, neck pain and syncope.  Psychiatric: Denied psychiatric symptoms, anxiety and depression.  Endocrine: Denied endocrine symptoms including hot flashes and night sweats.   Meds:   Current Outpatient Medications on File Prior to Visit  Medication Sig Dispense Refill   buprenorphine-naloxone (SUBOXONE) 8-2 mg SUBL SL tablet Place 1 tablet under the tongue daily. Patient uses 8mg  film bid     ibuprofen (ADVIL) 800 MG tablet Take 1 tablet (800 mg total) by mouth every 8 (eight) hours as needed. 30 tablet 0   pantoprazole (PROTONIX) 40 MG tablet Take 1 tablet (40 mg total) by mouth daily. 30 tablet 1   sertraline (ZOLOFT) 50 MG tablet Take 50 mg by mouth daily.     Vitamin D, Ergocalciferol, (DRISDOL) 1.25 MG (50000 UNIT) CAPS capsule Take 50,000 Units by mouth every 7 (seven) days.     No current facility-administered medications on file prior to visit.      Objective:     Vitals:   01/23/22 0949  BP: 118/77  Pulse: 73   Filed Weights   01/23/22 0949  Weight: 228 lb (103.4 kg)  Abdomen: Soft.  Non-tender.  No masses.  No HSM.  Incision/s: Intact.   Healing well.  No erythema.  No drainage.             Assessment:    G2P1011 Patient Active Problem List   Diagnosis Date Noted   Noninfectious diarrhea    Non-intractable cyclical vomiting with nausea    Diarrhea    Anxiety and depression 01/19/2015   History of cannabis dependence/abuse (HCC) 01/19/2015   Tobacco abuse 01/19/2015     1. Postoperative state     Patient doing well postop-full recovery.   Plan:            1.  May resume normal activities.-Follow-up for annual exam. Orders No orders of the defined types were placed in this encounter.   No orders of the defined types were placed in this encounter.     F/U  Return for Annual Physical.  Elonda Husky, M.D. 01/23/2022 10:19 AM

## 2022-01-23 NOTE — Progress Notes (Unsigned)
Virtual Visit via Video Note  I connected with Kendra [name] on 01/24/22 at  3:00 PM EDT by a video enabled telemedicine application and verified that I am speaking with the correct person using two identifiers.  Location: Patient: home Provider: office Persons participated in the visit- patient, provider    I discussed the limitations of evaluation and management by telemedicine and the availability of in person appointments. The patient expressed understanding and agreed to proceed.    I discussed the assessment and treatment plan with the patient. The patient was provided an opportunity to ask questions and all were answered. The patient agreed with the plan and demonstrated an understanding of the instructions.   The patient was advised to call back or seek an in-person evaluation if the symptoms worsen or if the condition fails to improve as anticipated.  I provided 24 minutes of non-face-to-face time during this encounter.   Kendra Hotter, MD    [facility] MD/PA/NP OP Progress Note  01/24/2022 3:38 PM Kendra [name]  MRN:  [id number]  Chief Complaint:  Chief Complaint  Patient presents with   Follow-up   Depression   Anxiety   HPI:  - she presented to ED at least a few times for chest pain. She had an episode of loss of conscious after being discharged from ED.   This is a follow-up appointment for depression and anxiety.  She states that she continues to have chest pain.  She feels upset that her cardiologist told her that it may be secondary to psychological issues.  She thinks she has serious pain all the time.  She is trying to figure out what it is.  She has been seen by GI provider for this.  She states that she has chest pain without any triggers or subacromial symptoms except that she occasionally has shoulder pain.  She does not know what to do.  She feels scared as she also has a family history of her grandfather having MI in the past.  She tries to take her mind  off from chest pain.  She enjoys the time with her daughter, and enjoys crochet.  She has good sleep except that she needs to get the couch elevated due to GERD.  She feels depressed due to her ongoing symptoms.  She talks about an example of her not being able to go to the beach as she would not be able to stay outside all day, although she would do it if she were not to have any physical symptoms.  She denies change in appetite or weight.  She denies SI.  Although she feels anxious, she declined when the provider offered her to be prescribed on Xanax.  She states that she has a history of substance use, and does not want to try any medication which can be addictive.  He is willing to try higher dose of sertraline.  She also asks any technique to be used when she has the chest pain.  Provided psycho education about mindfulness; she agrees to try this.    Wt Readings from Last 3 Encounters:  01/23/22 228 lb (103.4 kg)  01/20/22 220 lb (99.8 kg)  01/13/22 74 lb 15.3 oz (34 kg)     Daily routine: household chores, gardening, sees her grandmother, helps her daughter to go to school Support: mother, grandmother, father Household: husband, daughter Marital status: married Number of children: 1 daughter, 73 yo Employment: unemployed, used to work in Physiological scientist, Engineer, civil (consulting):  had college  experience in [place] Last PCP / ongoing medical evaluation:   Born in [place], grew up in [place] at age [age]. She moved from [place] to [place] to be closer with her grandmother and her father. She reports good childhood, although it was challenging when her parents got divorced when she was 65 year old. She describes her mother as a good mother, and reports great relationship with both of her parents.  Visit Diagnosis:    ICD-10-CM   1. MDD (major depressive disorder), recurrent episode, mild (HCC)  F33.0     2. Anxiety state  F41.1       Past Psychiatric History: Please see initial  evaluation for full details. I have reviewed the history. No updates at this time.     Past Medical History:  Past Medical History:  Diagnosis Date   Anxiety    Depression    GERD (gastroesophageal reflux disease)    Headache    stress   Heart murmur    History of cannabis abuse    Seizures (HCC)    age 89. syncopal episode. Hit head. had seizure.    Tobacco abuse    Vitamin D deficiency     Past Surgical History:  Procedure Laterality Date   COLON SURGERY  2013   pt states "colonoscopy while awake"   COLONOSCOPY WITH PROPOFOL N/A 12/14/2015   Procedure: COLONOSCOPY WITH PROPOFOL;  Surgeon: Midge Minium, MD;  Location: [facility];  Service: Endoscopy;  Laterality: N/A;   ESOPHAGOGASTRODUODENOSCOPY (EGD) WITH PROPOFOL N/A 12/14/2015   Procedure: ESOPHAGOGASTRODUODENOSCOPY (EGD) WITH PROPOFOL;  Surgeon: Midge Minium, MD;  Location: [facility];  Service: Endoscopy;  Laterality: N/A;   LAPAROSCOPIC TUBAL LIGATION Bilateral 12/03/2021   Procedure: LAPAROSCOPIC TUBAL LIGATION;  Surgeon: Linzie Collin, MD;  Location: [facility];  Service: Gynecology;  Laterality: Bilateral;   WISDOM TOOTH EXTRACTION  2013    Family Psychiatric History: Please see initial evaluation for full details. I have reviewed the history. No updates at this time.     Family History:  Family History  Problem Relation Age of Onset   Depression Mother    Diabetes Maternal Grandfather    Congestive Heart Failure Maternal Grandfather    Diverticulitis Maternal Grandmother    Dementia Maternal Grandmother     Social History:  Social History   Socioeconomic History   Marital status: Married    Spouse name: Not on file   Number of children: Not on file   Years of education: Not on file   Highest education level: Not on file  Occupational History   Not on file  Tobacco Use   Smoking status: Every Day    Packs/day: 1.00    Years: 8.00    Total pack years: 8.00    Types: Cigarettes    Smokeless tobacco: Never  Vaping Use   Vaping Use: Never used  Substance and Sexual Activity   Alcohol use: No   Drug use: Not Currently    Types: Marijuana   Sexual activity: Yes    Birth control/protection: Surgical    Comment: BTL  Other Topics Concern   Not on file  Social History Narrative   Not on file   Social Determinants of Health   Financial Resource Strain: Low Risk  (12/11/2021)   Overall Financial Resource Strain (CARDIA)    Difficulty of Paying Living Expenses: Not very hard  Food Insecurity: No Food Insecurity (10/11/2021)   Hunger Vital Sign    Worried About  Running Out of Food in the Last Year: Never true    Ran Out of Food in the Last Year: Never true  Transportation Needs: No Transportation Needs (09/13/2021)   PRAPARE - Administrator, Civil Service (Medical): No    Lack of Transportation (Non-Medical): No  Physical Activity: Sufficiently Active (12/11/2021)   Exercise Vital Sign    Days of Exercise per Week: 5 days    Minutes of Exercise per Session: 60 min  Stress: No Stress Concern Present (11/07/2021)   [employer] of Occupational Health - Occupational Stress Questionnaire    Feeling of Stress : Only a little  Social Connections: Not on file    Allergies: No Known Allergies  Metabolic Disorder Labs: No results found for: "HGBA1C", "MPG" No results found for: "PROLACTIN" No results found for: "CHOL", "TRIG", "HDL", "CHOLHDL", "VLDL", "LDLCALC" Lab Results  Component Value Date   TSH 2.583 09/16/2021   TSH 2.813 07/10/2021    Therapeutic Level Labs: No results found for: "LITHIUM" No results found for: "VALPROATE" No results found for: "CBMZ"  Current Medications: Current Outpatient Medications  Medication Sig Dispense Refill   buprenorphine-naloxone (SUBOXONE) 8-2 mg SUBL SL tablet Place 1 tablet under the tongue daily. Patient uses 8mg  film bid     ibuprofen (ADVIL) 800 MG tablet Take 1 tablet (800 mg total) by mouth every  8 (eight) hours as needed. 30 tablet 0   pantoprazole (PROTONIX) 40 MG tablet Take 1 tablet (40 mg total) by mouth daily. 30 tablet 1   sertraline (ZOLOFT) 50 MG tablet Take 100 mg by mouth daily.     Vitamin D, Ergocalciferol, (DRISDOL) 1.25 MG (50000 UNIT) CAPS capsule Take 50,000 Units by mouth every 7 (seven) days.     No current facility-administered medications for this visit.     Musculoskeletal: Strength & Muscle Tone:  N/A Gait & Station:  N/A Patient leans: N/A  Psychiatric Specialty Exam: Review of Systems  Psychiatric/Behavioral:  Positive for dysphoric mood. Negative for agitation, behavioral problems, confusion, decreased concentration, hallucinations, self-injury, sleep disturbance and suicidal ideas. The patient is nervous/anxious. The patient is not hyperactive.   All other systems reviewed and are negative.   Last menstrual period 01/11/2022.There is no height or weight on file to calculate BMI.  General Appearance: Fairly Groomed  Eye Contact:  Good  Speech:  Clear and Coherent  Volume:  Normal  Mood:  Anxious and Depressed  Affect:  Appropriate, Congruent, and slightly tense  Thought Process:  Coherent  Orientation:  Full (Time, Place, and Person)  Thought Content: Logical   Suicidal Thoughts:  No  Homicidal Thoughts:  No  Memory:  Immediate;   Good  Judgement:  Good  Insight:  Good  Psychomotor Activity:  Normal  Concentration:  Concentration: Good and Attention Span: Good  Recall:  Good  Fund of Knowledge: Good  Language: Good  Akathisia:  No  Handed:  Right  AIMS (if indicated): not done  Assets:  Communication Skills Desire for Improvement  ADL's:  Intact  Cognition: WNL  Sleep:  Fair   Screenings: PHQ2-9    Flowsheet Row Video Visit from 12/05/2021 in [facility] Patient Outreach Telephone from 10/15/2021 in [facility]  PHQ-2 Total Score 1 4  PHQ-9 Total Score -- 10       Flowsheet Row ED from 01/20/2022 in [facility] from 01/13/2022 in [facility] from  01/09/2022 in [facility]  C-SSRS RISK CATEGORY No Risk No Risk No Risk        Assessment and Plan:  Kendra [name] is a 31 y.o. year old female with a history of depression, anxiety, history of opioid dependence on Suboxone, seizure, cervical spondylosis, r/o fibromyalgia, who presents for follow up appointment for below.     1. MDD (major depressive disorder), recurrent episode, mild (HCC) 2. Anxiety state She reports slight worsening in depressive symptoms and anxiety in the setting of ongoing somatic symptoms including chest pain since the last visit. Noted that she thinks her somatic symptoms has worsened since she had COVID in January.  She denies any significant psychosocial stressors otherwise, and reports great relationship with her daughter, her family, including her parents and grandmother.  We uptitrate sertraline to optimize treatment for depression and anxiety.  Provided psychoeducation of mindfulness.   # opioid dependence on Suboxone Unchanged. She has been on Suboxone for heroin dependence, prescribed at [facility].  She has been abstinent for the past 4 years without any craving.  Will continue motivational interview.    Plan Increase sertraline 100 mg daily  Next appointment: 8/16 at 4 PM for 30 mins, video   The patient demonstrates the following risk factors for suicide: Chronic risk factors for suicide include: psychiatric disorder of depression . Acute risk factors for suicide include: N/A. Protective factors for this patient include: positive social support, responsibility to others (children, family), and hope for the future. Considering these factors, the overall suicide risk at this point appears to be low. Patient is appropriate for outpatient follow  up.            Collaboration of Care: Collaboration of Care: Other N/A  Patient/Guardian was advised Release of Information must be obtained prior to any record release in order to collaborate their care with an outside provider. Patient/Guardian was advised if they have not already done so to contact the registration department to sign all necessary forms in order for [place] to release information regarding their care.   Consent: Patient/Guardian gives verbal consent for treatment and assignment of benefits for services provided during this visit. Patient/Guardian expressed understanding and agreed to proceed.    Kendra Hotter, MD 01/24/2022, 3:38 PM

## 2022-01-23 NOTE — Telephone Encounter (Signed)
Sent link for video visit through Epic. Patient did not sign in. Called the patient for appointment scheduled today. The patient did not answer the phone. Left voice message to contact the office ([phone]).   ?

## 2022-01-24 ENCOUNTER — Telehealth (INDEPENDENT_AMBULATORY_CARE_PROVIDER_SITE_OTHER): Payer: Medicaid Other | Admitting: Psychiatry

## 2022-01-24 ENCOUNTER — Encounter: Payer: Self-pay | Admitting: Psychiatry

## 2022-01-24 DIAGNOSIS — F330 Major depressive disorder, recurrent, mild: Secondary | ICD-10-CM

## 2022-01-24 DIAGNOSIS — F411 Generalized anxiety disorder: Secondary | ICD-10-CM | POA: Diagnosis not present

## 2022-01-24 NOTE — Patient Instructions (Signed)
Increase sertraline 100 mg daily  Next appointment: 8/16 at 4 PM, video

## 2022-01-25 ENCOUNTER — Encounter (HOSPITAL_COMMUNITY): Payer: Self-pay

## 2022-01-25 ENCOUNTER — Telehealth (HOSPITAL_COMMUNITY): Payer: Self-pay | Admitting: *Deleted

## 2022-01-25 ENCOUNTER — Other Ambulatory Visit (HOSPITAL_COMMUNITY): Payer: Self-pay | Admitting: *Deleted

## 2022-01-25 ENCOUNTER — Other Ambulatory Visit: Payer: Self-pay | Admitting: Cardiology

## 2022-01-25 DIAGNOSIS — Z720 Tobacco use: Secondary | ICD-10-CM

## 2022-01-25 DIAGNOSIS — R079 Chest pain, unspecified: Secondary | ICD-10-CM

## 2022-01-25 MED ORDER — METOPROLOL TARTRATE 100 MG PO TABS: ORAL_TABLET | ORAL | 0 refills | Status: DC

## 2022-01-25 MED ORDER — IVABRADINE HCL 5 MG PO TABS: ORAL_TABLET | ORAL | 0 refills | Status: DC

## 2022-01-25 NOTE — Telephone Encounter (Signed)
Attempted to call patient regarding upcoming cardiac CT appointment. °Left message on voicemail with name and callback number ° °Phillipa Morden RN Navigator Cardiac Imaging °[facility] °[phone] Office °[phone] Cell ° °

## 2022-01-25 NOTE — Telephone Encounter (Signed)
Patient returning call about her upcoming cardiac CT scan. She state she has had propanolol in the past and it brought her heart down to a "scary low." Patient does not feel comfortable taking medications for test. I explained the importance of a low HR for this test and stated that from the documentation that I had she would need the medications that I have sent to her pharmacy per protocol. She states that she understood the need for a low HR but was only willing to take the metoprolol for the test. Patient will have her husband come with her to the appointment.  Kendra Brick RN Navigator Cardiac Imaging [facility] [phone] Office [phone] Cell

## 2022-01-26 ENCOUNTER — Encounter: Payer: Self-pay | Admitting: Gastroenterology

## 2022-01-26 ENCOUNTER — Emergency Department: Payer: Medicaid Other

## 2022-01-26 ENCOUNTER — Emergency Department
Admission: EM | Admit: 2022-01-26 | Discharge: 2022-01-26 | Disposition: A | Discharge status: Home / Self Care | Payer: Medicaid Other | Attending: Emergency Medicine | Admitting: Emergency Medicine

## 2022-01-26 DIAGNOSIS — M546 Pain in thoracic spine: Secondary | ICD-10-CM | POA: Insufficient documentation

## 2022-01-26 DIAGNOSIS — R0789 Other chest pain: Secondary | ICD-10-CM | POA: Insufficient documentation

## 2022-01-26 DIAGNOSIS — R079 Chest pain, unspecified: Secondary | ICD-10-CM | POA: Diagnosis present

## 2022-01-26 LAB — CBC
HCT: 39.8 % (ref 36.0–46.0)
Hemoglobin: 13 g/dL (ref 12.0–15.0)
MCH: 32.4 pg (ref 26.0–34.0)
MCHC: 32.7 g/dL (ref 30.0–36.0)
MCV: 99.3 fL (ref 80.0–100.0)
Platelets: 262 10*3/uL (ref 150–400)
RBC: 4.01 MIL/uL (ref 3.87–5.11)
RDW: 11.9 % (ref 11.5–15.5)
WBC: 7.5 10*3/uL (ref 4.0–10.5)
nRBC: 0 % (ref 0.0–0.2)

## 2022-01-26 LAB — BASIC METABOLIC PANEL
Anion gap: 6 (ref 5–15)
BUN: 7 mg/dL (ref 6–20)
CO2: 27 mmol/L (ref 22–32)
Calcium: 9 mg/dL (ref 8.9–10.3)
Chloride: 105 mmol/L (ref 98–111)
Creatinine, Ser: 0.82 mg/dL (ref 0.44–1.00)
GFR, Estimated: >60 mL/min (ref >60)
Glucose, Bld: 96 mg/dL (ref 70–99)
Potassium: 3.7 mmol/L (ref 3.5–5.1)
Sodium: 138 mmol/L (ref 135–145)

## 2022-01-26 LAB — TROPONIN I (HIGH SENSITIVITY): Troponin I (High Sensitivity): <2 ng/L (ref <18)

## 2022-01-26 LAB — POC URINE PREG, ED: Preg Test, Ur: NEGATIVE

## 2022-01-26 MED ORDER — SUCRALFATE 1 G PO TABS: 1.0000 g | ORAL_TABLET | Freq: Three times a day (TID) | ORAL | 0 refills | Status: DC

## 2022-01-26 MED ORDER — PANTOPRAZOLE SODIUM 40 MG PO TBEC: 40.0000 mg | DELAYED_RELEASE_TABLET | Freq: Two times a day (BID) | ORAL | 1 refills | Status: DC

## 2022-01-26 NOTE — ED Provider Notes (Signed)
[facility] Provider Note    Event Date/Time   First MD Initiated Contact with Patient 01/26/22 1843     (approximate)   History   Back Pain   HPI  Kendra [name] is a 31 y.o. female  here with chest pain.  The patient has had ongoing, recurrent chest pain and has been seen in the ED multiple times for this.  She has been seen by her PCP and is actually scheduled for a cardiac CT with cardiology on Monday to rule out cardiac causes.  She also has a GI appointment coming up.  She states that tonight, the pain just got worse than usual, and she felt a worsening pain in her upper back.  This is where she has felt pain with this before.  Describes it as a intermittent, spasm-like, significant pain.  She feels short of breath with it.  She does have a history of reflux, and has seen an ENT which scoped her and told her she had a significant amount of what appeared to be reflux on her scope.  She has been taking an antacid.  By time of my assessment, pain is improving.  No other medication changes.      Physical Exam   Triage Vital Signs: ED Triage Vitals [01/26/22 1811]  Enc Vitals Group     BP 116/84     Pulse Rate (!) 103     Resp 18     Temp 98.6 F (37 C)     Temp Source Oral     SpO2 97 %     Weight      Height      Head Circumference      Peak Flow      Pain Score 8     Pain Loc      Pain Edu?      Excl. in GC?     Most recent vital signs: Vitals:   01/26/22 1811 01/26/22 1945  BP: 116/84 119/84  Pulse: (!) 103 90  Resp: 18 15  Temp: 98.6 F (37 C) 98.6 F (37 C)  SpO2: 97% 98%     General: Awake, no distress.  CV:  Good peripheral perfusion.  Regular rate and rhythm. Resp:  Normal effort.  Lungs clear to auscultation bilaterally. Abd:  No distention.  No tenderness Other:  Moist mucous membranes.  Oropharynx clear.  No tonsillar swelling or exudates.   ED Results / Procedures / Treatments   Labs (all labs ordered are  listed, but only abnormal results are displayed) Labs Reviewed  POC URINE PREG, ED - Normal  BASIC METABOLIC PANEL  CBC  TROPONIN I (HIGH SENSITIVITY)  TROPONIN I (HIGH SENSITIVITY)     EKG Normal sinus rhythm with sinus arrhythmia, ventricular rate 91.  PR 154, QRS 92, QTc 445.  No acute ST elevations or depressions.  No EKG evidence of acute ischemia or infarct.   RADIOLOGY Chest x-ray: No active disease   I also independently reviewed and agree with radiologist interpretations.   PROCEDURES:  Critical Care performed: No  MEDICATIONS ORDERED IN ED: Medications - No data to display   IMPRESSION / MDM / ASSESSMENT AND PLAN / ED COURSE  I reviewed the triage vital signs and the nursing notes.                               The patient is on the  cardiac monitor to evaluate for evidence of arrhythmia and/or significant heart rate changes.   Ddx:  Differential includes the following, with pertinent life- or limb-threatening emergencies considered:  Esophageal spasm, gastritis/GERD, atypical angina, Prinzmetal's angina, musculoskeletal pain, anxiety  Patient's presentation is most consistent with acute presentation with potential threat to life or bodily function.  MDM:  31 year old female here with recurrent, episodic chest pain.  The patient has had a fairly extensive recent work-up and I reviewed her multiple ED visits as well as PCP visits.  Patient is currently being seen by cardiology as well as GI.  Clinically, the patient symptoms seem more consistent with possible esophageal spasm.  She does have a history of significant gastritis/GERD which would fit with this.  Chest x-ray today is clear.  EKG is nonischemic.  Troponin is negative and she has had recurrent negative cardiac work-ups, do not suspect ACS.  CBC and BMP unremarkable.  She has no significant focal abdominal tenderness.  Will treat with increase in her antacid regimen, Carafate.  I do think she may benefit  from nitroglycerin, but she is hesitant to try this as she has some borderline low blood pressures at baseline.  She is seeing cardiology on Monday for her cardiac CT and they can further discuss.  Otherwise, no apparent emergent pathology.  No apparent indication for admission at this time.   MEDICATIONS GIVEN IN ED: Medications - No data to display   Consults:     EMR reviewed  Reviewed her previous ED visits, PCP visits, cardiology visits including visit with Sena Slate 01/22/2022     FINAL CLINICAL IMPRESSION(S) / ED DIAGNOSES   Final diagnoses:  Atypical chest pain     Rx / DC Orders   ED Discharge Orders          Ordered    sucralfate (CARAFATE) 1 g tablet  3 times daily with meals & bedtime        01/26/22 1944    pantoprazole (PROTONIX) 40 MG tablet  2 times daily        01/26/22 1944             Note:  This document was prepared using Dragon voice recognition software and may include unintentional dictation errors.   Shaune Pollack, MD 01/26/22 2002

## 2022-01-26 NOTE — ED Triage Notes (Addendum)
Pt comes pov for shooting back pain originating at her lower back and shooting to her neck. Pt is being seen for cardiac issues and is concerned they are related. Is having some chest pressure over her heart right now as well. Has a cardiac test schedule in 2 days.

## 2022-01-26 NOTE — Discharge Instructions (Signed)
As we discussed, consider nitroglycerin for your pain/spasms if you tolerate it on Monday with Cardiology.  Discuss with your PCP.  Increase the protonix to TWICE a day (double your current dose total).   Start carafate for the next week

## 2022-01-27 ENCOUNTER — Other Ambulatory Visit: Payer: Self-pay

## 2022-01-27 ENCOUNTER — Encounter: Payer: Self-pay | Admitting: Emergency Medicine

## 2022-01-27 ENCOUNTER — Emergency Department
Admission: EM | Admit: 2022-01-27 | Discharge: 2022-01-27 | Disposition: A | Discharge status: Home / Self Care | Payer: Medicaid Other | Attending: Emergency Medicine | Admitting: Emergency Medicine

## 2022-01-27 DIAGNOSIS — R55 Syncope and collapse: Secondary | ICD-10-CM | POA: Diagnosis present

## 2022-01-27 LAB — TROPONIN I (HIGH SENSITIVITY): Troponin I (High Sensitivity): <2 ng/L (ref <18)

## 2022-01-27 LAB — COMPREHENSIVE METABOLIC PANEL
ALT: 20 U/L (ref 0–44)
AST: 23 U/L (ref 15–41)
Albumin: 4.2 g/dL (ref 3.5–5.0)
Alkaline Phosphatase: 49 U/L (ref 38–126)
Anion gap: 9 (ref 5–15)
BUN: 9 mg/dL (ref 6–20)
CO2: 24 mmol/L (ref 22–32)
Calcium: 9.2 mg/dL (ref 8.9–10.3)
Chloride: 108 mmol/L (ref 98–111)
Creatinine, Ser: 0.69 mg/dL (ref 0.44–1.00)
GFR, Estimated: >60 mL/min (ref >60)
Glucose, Bld: 77 mg/dL (ref 70–99)
Potassium: 3.8 mmol/L (ref 3.5–5.1)
Sodium: 141 mmol/L (ref 135–145)
Total Bilirubin: 0.8 mg/dL (ref 0.3–1.2)
Total Protein: 7.6 g/dL (ref 6.5–8.1)

## 2022-01-27 LAB — CBC
HCT: 43.4 % (ref 36.0–46.0)
Hemoglobin: 14.3 g/dL (ref 12.0–15.0)
MCH: 32.6 pg (ref 26.0–34.0)
MCHC: 32.9 g/dL (ref 30.0–36.0)
MCV: 99.1 fL (ref 80.0–100.0)
Platelets: 216 10*3/uL (ref 150–400)
RBC: 4.38 MIL/uL (ref 3.87–5.11)
RDW: 11.9 % (ref 11.5–15.5)
WBC: 7.7 10*3/uL (ref 4.0–10.5)
nRBC: 0 % (ref 0.0–0.2)

## 2022-01-27 MED ORDER — ONDANSETRON 4 MG PO TBDP
4.0000 mg | ORAL_TABLET | Freq: Once | ORAL | Status: AC
  Administered 2022-01-27: 4 mg via ORAL
  Filled 2022-01-27: qty 1

## 2022-01-27 NOTE — Discharge Instructions (Addendum)
Your exam, labs, EKG, and x-ray are all reassuring and normal at this time.  No signs of dehydration, electrolyte abnormality, or abnormal EKG findings.  Continue to rest and hydrate as necessary.  Reports excessive prolonged heat exposure due to high daily temperatures.

## 2022-01-27 NOTE — ED Provider Notes (Signed)
[facility] Emergency Department Provider Note     Event Date/Time   First MD Initiated Contact with Patient 01/27/22 1609     (approximate)   History   Loss of Consciousness   HPI  Kendra [name] is a 31 y.o. female with a history of anxiety, depression, and atypical chest pain, presents to the ED via EMS from home.  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  She was with her husband when she apparently felt like she was overheating.  She sat in one of the vehicles with the air on to try to cool himself down.  She got out of the vehicle apparently she fell without reports of head injury, but did have evidence of syncope.  EMS was called to the scene, and brings the patient to the ED for further evaluation.  EMS report notes normal vital signs without signs of hypoglycemia or hypotension.  She denies any complaints of pain at this time.   Physical Exam   Triage Vital Signs: ED Triage Vitals  Enc Vitals Group     BP 01/27/22 1454 118/82     Pulse Rate 01/27/22 1454 80     Resp 01/27/22 1454 20     Temp 01/27/22 1454 98.5 F (36.9 C)     Temp Source 01/27/22 1454 Oral     SpO2 01/27/22 1454 100 %     Weight 01/27/22 1452 220 lb (99.8 kg)     Height 01/27/22 1452 5\' 7"  (1.702 m)     Head Circumference --      Peak Flow --      Pain Score 01/27/22 1451 5     Pain Loc --      Pain Edu? --      Excl. in GC? --     Most recent vital signs: Vitals:   01/27/22 1454  BP: 118/82  Pulse: 80  Resp: 20  Temp: 98.5 F (36.9 C)  SpO2: 100%    General Awake, no distress.  HEENT NCAT. PERRL. EOMI. No rhinorrhea. Mucous membranes are moist.  CV:  Good peripheral perfusion. RRR RESP:  Normal effort. CTA ABD:  No distention.  NEURO: CN II-XII grossly intact   ED Results / Procedures / Treatments   Labs (all labs ordered are listed, but only abnormal results are displayed) Labs Reviewed  CBC   COMPREHENSIVE METABOLIC PANEL  TROPONIN I (HIGH SENSITIVITY)  TROPONIN I (HIGH SENSITIVITY)     EKG  Vent. rate 79 BPM PR interval 152 ms QRS duration 90 ms QT/QTcB 374/428 ms P-R-T axes 61 47 32 NSR Normal axis No STEMI  RADIOLOGY  I personally viewed and evaluated these images as part of my medical decision making, as well as reviewing the written report by the radiologist.  ED Provider Interpretation: no acute findings}  DG Chest 2 View  Result Date: 01/26/2022 CLINICAL DATA:  cp EXAM: CHEST - 2 VIEW COMPARISON:  Chest radiograph dated January 20, 2022 FINDINGS: The cardiomediastinal silhouette is normal in contour. No pleural effusion. No pneumothorax. No acute pleuroparenchymal abnormality. Visualized abdomen is unremarkable. No acute osseous abnormality noted. IMPRESSION: No acute cardiopulmonary abnormality. Electronically Signed   By: January 22, 2022 M.D.   On: 01/26/2022 18:54     PROCEDURES:  Critical Care performed: No  ED EKG  Date/Time: 01/27/2022 5:16 PM  Performed by: 03/30/2022, PA-C Authorized by: Lissa Hoard, MD  MEDICATIONS ORDERED IN ED: Medications  ondansetron (ZOFRAN-ODT) disintegrating tablet 4 mg (has no administration in time range)     IMPRESSION / MDM / ASSESSMENT AND PLAN / ED COURSE  I reviewed the triage vital signs and the nursing notes.                              Differential diagnosis includes, but is not limited to, syncope, near syncope, dehydration, hypoglycemia, cardiac arrhythmia  Patient's presentation is most consistent with acute complicated illness / injury requiring diagnostic workup.  Patient's diagnosis is consistent with syncope secondary to heat exhaustion.  She presents via EMS from home with a reassuring work-up and lab evaluation.  No malignant arrhythmia on EKG and no acute infectious process on chest x-ray, based on my review of images.  Vital signs remained stable and patient's course  has been reassuring patient tolerated p.o. without emesis.  She discharged at this time to follow with primary provider.  Patient is to follow up with PCP as needed or otherwise directed. Patient is given ED precautions to return to the ED for any worsening or new symptoms.     FINAL CLINICAL IMPRESSION(S) / ED DIAGNOSES   Final diagnoses:  Syncope and collapse     Rx / DC Orders   ED Discharge Orders     None        Note:  This document was prepared using Dragon voice recognition software and may include unintentional dictation errors.    Lissa Hoard, PA-C 01/27/22 1718    Shaune Pollack, MD 01/30/22 1102

## 2022-01-27 NOTE — ED Triage Notes (Signed)
Pt via EMS from home. Pt here for possible heat exhaustion. States that she was outside for 2 hours and then all of a sudden had a sudden onset of syncope and collapse. Pt c/o headache and thinks she may have hit her head. States she think she was out for 2 min, states that her daughter said she was "shaking and making weird noises. Pt is A&OX4 and NAD.

## 2022-01-27 NOTE — ED Notes (Signed)
Pt given ginger-ale, crackers and grahams for po challenge.

## 2022-01-27 NOTE — ED Triage Notes (Signed)
First Nurse NOte:  Pt via [facility] from home c/o heat exhaustion with collapse. States she fell out but was not fully unresponsive. Pt has been outside for 2 hours prior to episode and only drank 1 bottle of water. Pt is A&OX4 and NAD  132/86 81 HR 92% on RA  16 RR 127 CBG

## 2022-01-28 ENCOUNTER — Ambulatory Visit
Admission: RE | Admit: 2022-01-28 | Discharge: 2022-01-28 | Disposition: A | Discharge status: Home / Self Care | Payer: Medicaid Other | Source: Ambulatory Visit | Attending: Cardiology | Admitting: Cardiology

## 2022-01-28 DIAGNOSIS — Z720 Tobacco use: Secondary | ICD-10-CM

## 2022-01-28 DIAGNOSIS — R079 Chest pain, unspecified: Secondary | ICD-10-CM | POA: Diagnosis not present

## 2022-01-28 MED ORDER — NITROGLYCERIN 0.4 MG SL SUBL
0.8000 mg | SUBLINGUAL_TABLET | Freq: Once | SUBLINGUAL | Status: AC
  Administered 2022-01-28: 0.8 mg via SUBLINGUAL

## 2022-01-28 MED ORDER — METOPROLOL TARTRATE 5 MG/5ML IV SOLN
10.0000 mg | Freq: Once | INTRAVENOUS | Status: AC
  Administered 2022-01-28: 10 mg via INTRAVENOUS

## 2022-01-28 MED ORDER — NITROGLYCERIN 0.4 MG SL SUBL: 0.4000 mg | SUBLINGUAL_TABLET | Freq: Once | SUBLINGUAL | Status: DC

## 2022-01-28 MED ORDER — IOHEXOL 350 MG/ML SOLN
100.0000 mL | Freq: Once | INTRAVENOUS | Status: AC | PRN
  Administered 2022-01-28: 100 mL via INTRAVENOUS

## 2022-01-28 NOTE — Progress Notes (Signed)
Patient tolerated procedure well. Ambulate w/o difficulty. Denies any lightheadedness or being dizzy. Pt denies any pain at this time. Sitting in chair, drinking water provided. Pt is encouraged to drink additional water throughout the day and reason explained to patient. Patient verbalized understanding and all questions answered. ABC intact. No further needs at this time. Discharge from procedure area w/o issues.  

## 2022-01-30 ENCOUNTER — Emergency Department: Payer: Medicaid Other

## 2022-01-30 ENCOUNTER — Encounter: Payer: Self-pay | Admitting: Medical Oncology

## 2022-01-30 ENCOUNTER — Emergency Department
Admission: EM | Admit: 2022-01-30 | Discharge: 2022-01-30 | Disposition: A | Discharge status: Home / Self Care | Payer: Medicaid Other | Attending: Emergency Medicine | Admitting: Emergency Medicine

## 2022-01-30 DIAGNOSIS — F17200 Nicotine dependence, unspecified, uncomplicated: Secondary | ICD-10-CM | POA: Diagnosis not present

## 2022-01-30 DIAGNOSIS — R079 Chest pain, unspecified: Secondary | ICD-10-CM

## 2022-01-30 DIAGNOSIS — R0789 Other chest pain: Secondary | ICD-10-CM | POA: Diagnosis present

## 2022-01-30 DIAGNOSIS — Z720 Tobacco use: Secondary | ICD-10-CM

## 2022-01-30 LAB — CBC WITH DIFFERENTIAL/PLATELET
Abs Immature Granulocytes: 0.01 10*3/uL (ref 0.00–0.07)
Basophils Absolute: 0 10*3/uL (ref 0.0–0.1)
Basophils Relative: 0 %
Eosinophils Absolute: 0.1 10*3/uL (ref 0.0–0.5)
Eosinophils Relative: 1 %
HCT: 42.5 % (ref 36.0–46.0)
Hemoglobin: 14.4 g/dL (ref 12.0–15.0)
Immature Granulocytes: 0 %
Lymphocytes Relative: 25 %
Lymphs Abs: 1.7 10*3/uL (ref 0.7–4.0)
MCH: 32.7 pg (ref 26.0–34.0)
MCHC: 33.9 g/dL (ref 30.0–36.0)
MCV: 96.4 fL (ref 80.0–100.0)
Monocytes Absolute: 0.5 10*3/uL (ref 0.1–1.0)
Monocytes Relative: 8 %
Neutro Abs: 4.4 10*3/uL (ref 1.7–7.7)
Neutrophils Relative %: 66 %
Platelets: 274 10*3/uL (ref 150–400)
RBC: 4.41 MIL/uL (ref 3.87–5.11)
RDW: 11.9 % (ref 11.5–15.5)
WBC: 6.8 10*3/uL (ref 4.0–10.5)
nRBC: 0 % (ref 0.0–0.2)

## 2022-01-30 LAB — COMPREHENSIVE METABOLIC PANEL
ALT: 19 U/L (ref 0–44)
AST: 18 U/L (ref 15–41)
Albumin: 4.5 g/dL (ref 3.5–5.0)
Alkaline Phosphatase: 51 U/L (ref 38–126)
Anion gap: 11 (ref 5–15)
BUN: 11 mg/dL (ref 6–20)
CO2: 24 mmol/L (ref 22–32)
Calcium: 9.7 mg/dL (ref 8.9–10.3)
Chloride: 102 mmol/L (ref 98–111)
Creatinine, Ser: 0.82 mg/dL (ref 0.44–1.00)
GFR, Estimated: >60 mL/min (ref >60)
Glucose, Bld: 96 mg/dL (ref 70–99)
Potassium: 4.2 mmol/L (ref 3.5–5.1)
Sodium: 137 mmol/L (ref 135–145)
Total Bilirubin: 0.9 mg/dL (ref 0.3–1.2)
Total Protein: 8.1 g/dL (ref 6.5–8.1)

## 2022-01-30 LAB — TROPONIN I (HIGH SENSITIVITY)
Troponin I (High Sensitivity): <2 ng/L (ref <18)
Troponin I (High Sensitivity): 3 ng/L (ref <18)

## 2022-01-30 NOTE — ED Provider Notes (Signed)
[facility] Provider Note    Event Date/Time   First MD Initiated Contact with Patient 01/30/22 1627     (approximate)   History   Chest Pain   HPI  Kendra [name] is a 31 y.o. female  with a history of ADHD, tobacco abuse, cannabis dependence/abuse and chronic chest pain having undergone extensive work-up including echocardiogram that was normal and most recently CTA chest on 7/3 who presents for evaluation of ongoing substernal chest discomfort.  She denies any fevers, cough, vomiting, diarrhea, abdominal pain, back pain rash or extremity pain.  She states she is taking her omeprazole and has been working to reduce acidic foods.  She states she is an upcoming appointment with a GI doctor and is working in getting into see Dr. Juliann Pares for new cardiology as her previous cardiologist Dr Beatrix Fetters is leaving the area.  She denies any new tobacco abuse and stating she is actually cutting down on her cigarettes.  No recent EtOH use or illicit drug use aside from some THC.  No other acute concerns at this time.      Physical Exam  Triage Vital Signs: ED Triage Vitals  Enc Vitals Group     BP 01/30/22 1321 (!) 133/93     Pulse Rate 01/30/22 1321 88     Resp 01/30/22 1321 16     Temp 01/30/22 1321 98.3 F (36.8 C)     Temp Source 01/30/22 1321 Oral     SpO2 01/30/22 1321 100 %     Weight 01/30/22 1332 218 lb 4.1 oz (99 kg)     Height 01/30/22 1332 5\' 7"  (1.702 m)     Head Circumference --      Peak Flow --      Pain Score 01/30/22 1332 8     Pain Loc --      Pain Edu? --      Excl. in GC? --     Most recent vital signs: Vitals:   01/30/22 1321 01/30/22 1647  BP: (!) 133/93 118/71  Pulse: 88 79  Resp: 16 17  Temp: 98.3 F (36.8 C) 98.2 F (36.8 C)  SpO2: 100% 100%    General: Awake, no distress.  CV:  Good peripheral perfusion.  2+ radial pulses.  No murmur. Resp:  Normal effort.  Clear bilaterally. Abd:  No distention.  Soft  throughout. Other:    ED Results / Procedures / Treatments  Labs (all labs ordered are listed, but only abnormal results are displayed) Labs Reviewed  CBC WITH DIFFERENTIAL/PLATELET  COMPREHENSIVE METABOLIC PANEL  PREGNANCY, URINE  TROPONIN I (HIGH SENSITIVITY)  TROPONIN I (HIGH SENSITIVITY)     EKG  EKG is remarkable for sinus rhythm with ventricular rate of 81, normal axis without any evidence of acute arrhythmia or ischemia.   RADIOLOGY Chest reviewed by myself shows no focal consoidation, effusion, edema, pneumothorax or other clear acute thoracic process. I also reviewed radiology interpretation and agree with findings described.    PROCEDURES:  Critical Care performed: No  Procedures   MEDICATIONS ORDERED IN ED: Medications - No data to display   IMPRESSION / MDM / ASSESSMENT AND PLAN / ED COURSE  I reviewed the triage vital signs and the nursing notes. Patient's presentation is most consistent with acute presentation with potential threat to life or bodily function.  Differential diagnosis includes, but is not limited to GERD, esophageal spasm, MSK, ACS with lower suspicion based on her history and exam and extensive previous cardiac work-up and chronicity for dissection.  She has no abnormal vital signs and overall a very low sufficient for PE.  I reviewed her CTA obtained on the third that was essentially negative.  EKG is remarkable for sinus rhythm with ventricular rate of 81, normal axis without any evidence of acute arrhythmia or ischemia.  X2 are nonelevated today and not suggestive of ACS or ischemia.  Chest reviewed by myself shows no focal consoidation, effusion, edema, pneumothorax or other clear acute thoracic process. I also reviewed radiology interpretation and agree with findings described.  CMP without any significant lecture light or metabolic derangements.  CBC shows no leukocytosis or acute anemia.  Given she  states this pain is similar slightly worse than pain she has been dealing with for months and having reviewed patient's extensive work-up including it seems to have been 26 ED visits for similar complaints in the last 6 months I do not believe she requires additional ED testing at this time and is appropriate for continued outpatient evaluation with cardiology and GI and PCP.  Counseled on continuing to reduce her tobacco use and avoiding all illicit drugs.  Discussed returning for any new symptoms.  Discharged in stable condition.  Strict return precautions advised and discussed.      FINAL CLINICAL IMPRESSION(S) / ED DIAGNOSES   Final diagnoses:  Chest pain, unspecified type  Tobacco abuse     Rx / DC Orders   ED Discharge Orders     None        Note:  This document was prepared using Dragon voice recognition software and may include unintentional dictation errors.   Gilles Chiquito, MD 01/30/22 [phone]

## 2022-01-30 NOTE — ED Triage Notes (Signed)
Pt reports this am she began having sharp pressure to left side of chest and down left arm.

## 2022-01-30 NOTE — ED Provider Triage Note (Signed)
Emergency Medicine Provider Triage Evaluation Note  Kendra [name] , a 31 y.o. female  was evaluated in triage.  Pt complains of chest pain that began this morning when she awoke. Reports that it happens daily for months. Left sided, radiates to left arm. Mild SOB. Reports that she was sitting and talking when it began. Went to [facility] ER yesterday. Had CT coronaries on 7/3. LMP 6/15  Review of Systems  Positive: CP/SOB Negative: Leg swelling  Physical Exam  LMP 01/11/2022 (Approximate)  Gen:   Awake, no distress   Resp:  Normal effort  MSK:   Moves extremities without difficulty  Other:    Medical Decision Making  Medically screening exam initiated at 1:21 PM.  Appropriate orders placed.  Kendra [name] was informed that the remainder of the evaluation will be completed by another provider, this initial triage assessment does not replace that evaluation, and the importance of remaining in the ED until their evaluation is complete.     Jackelyn Hoehn, PA-C 01/30/22 1326

## 2022-01-30 NOTE — ED Notes (Signed)
Pt in bed pt states that she has a heart doctor and is transferring to another, states that she is here for continued chest pain.  Resps even and unlabored, states that she came in today because it hurt more than normal

## 2022-01-31 ENCOUNTER — Other Ambulatory Visit: Payer: Self-pay

## 2022-02-03 ENCOUNTER — Encounter: Payer: Self-pay | Admitting: Emergency Medicine

## 2022-02-03 ENCOUNTER — Emergency Department
Admission: EM | Admit: 2022-02-03 | Discharge: 2022-02-03 | Disposition: A | Discharge status: Home / Self Care | Payer: Medicaid Other | Attending: Emergency Medicine | Admitting: Emergency Medicine

## 2022-02-03 ENCOUNTER — Other Ambulatory Visit: Payer: Self-pay

## 2022-02-03 DIAGNOSIS — R0789 Other chest pain: Secondary | ICD-10-CM

## 2022-02-03 DIAGNOSIS — R61 Generalized hyperhidrosis: Secondary | ICD-10-CM | POA: Diagnosis not present

## 2022-02-03 DIAGNOSIS — R072 Precordial pain: Secondary | ICD-10-CM | POA: Insufficient documentation

## 2022-02-03 LAB — CBC WITH DIFFERENTIAL/PLATELET
Abs Immature Granulocytes: 0.01 10*3/uL (ref 0.00–0.07)
Basophils Absolute: 0 10*3/uL (ref 0.0–0.1)
Basophils Relative: 0 %
Eosinophils Absolute: 0.1 10*3/uL (ref 0.0–0.5)
Eosinophils Relative: 1 %
HCT: 41.9 % (ref 36.0–46.0)
Hemoglobin: 14 g/dL (ref 12.0–15.0)
Immature Granulocytes: 0 %
Lymphocytes Relative: 30 %
Lymphs Abs: 2 10*3/uL (ref 0.7–4.0)
MCH: 33 pg (ref 26.0–34.0)
MCHC: 33.4 g/dL (ref 30.0–36.0)
MCV: 98.8 fL (ref 80.0–100.0)
Monocytes Absolute: 0.7 10*3/uL (ref 0.1–1.0)
Monocytes Relative: 10 %
Neutro Abs: 3.9 10*3/uL (ref 1.7–7.7)
Neutrophils Relative %: 59 %
Platelets: 251 10*3/uL (ref 150–400)
RBC: 4.24 MIL/uL (ref 3.87–5.11)
RDW: 12 % (ref 11.5–15.5)
WBC: 6.8 10*3/uL (ref 4.0–10.5)
nRBC: 0 % (ref 0.0–0.2)

## 2022-02-03 LAB — COMPREHENSIVE METABOLIC PANEL
ALT: 13 U/L (ref 0–44)
AST: 14 U/L — ABNORMAL LOW (ref 15–41)
Albumin: 4.2 g/dL (ref 3.5–5.0)
Alkaline Phosphatase: 48 U/L (ref 38–126)
Anion gap: 7 (ref 5–15)
BUN: 10 mg/dL (ref 6–20)
CO2: 25 mmol/L (ref 22–32)
Calcium: 9.1 mg/dL (ref 8.9–10.3)
Chloride: 104 mmol/L (ref 98–111)
Creatinine, Ser: 0.87 mg/dL (ref 0.44–1.00)
GFR, Estimated: >60 mL/min (ref >60)
Glucose, Bld: 100 mg/dL — ABNORMAL HIGH (ref 70–99)
Potassium: 3.8 mmol/L (ref 3.5–5.1)
Sodium: 136 mmol/L (ref 135–145)
Total Bilirubin: 0.9 mg/dL (ref 0.3–1.2)
Total Protein: 7.5 g/dL (ref 6.5–8.1)

## 2022-02-03 LAB — TROPONIN I (HIGH SENSITIVITY): Troponin I (High Sensitivity): <2 ng/L (ref <18)

## 2022-02-03 NOTE — ED Notes (Signed)
This RN spoke with Dr. Elesa Massed regarding patient care. Per Dr. Elesa Massed, no labs at this time, no chest X-ray. VORB for EKG only at this time.

## 2022-02-03 NOTE — ED Triage Notes (Signed)
It woke me up at 03:00 this morning.

## 2022-02-03 NOTE — ED Notes (Signed)
E-signature not working at this time. Pt verbalized understanding of D/C instructions, prescriptions and follow up care with no further questions at this time. Pt in NAD and ambulatory at time of D/C.  

## 2022-02-03 NOTE — ED Provider Notes (Signed)
[facility] Provider Note   Event Date/Time   First MD Initiated Contact with Patient 02/03/22 [phone]     (approximate) History  Chest Pain  HPI Kendra [name] is a 31 y.o. female  Location: Central chest, epigastric region Duration: Started last night at approximately 4 AM Timing: Partially resolved since onset Severity: 8/10 Quality: Sharp/burning Context: Patient states that she has had similar symptoms in the past with sharp/burning substernal chest pain that wakes her from sleep and radiates to the left arm Modifying factors: Denies Associated Symptoms: Left arm pain, diaphoresis ROS: Patient currently denies any vision changes, tinnitus, difficulty speaking, facial droop, sore throat, shortness of breath, abdominal pain, nausea/vomiting/diarrhea, dysuria, or weakness/numbness/paresthesias in any extremity   Physical Exam  Triage Vital Signs: ED Triage Vitals  Enc Vitals Group     BP 02/03/22 0618 120/79     Pulse Rate 02/03/22 0618 85     Resp 02/03/22 0618 19     Temp 02/03/22 0618 98.5 F (36.9 C)     Temp Source 02/03/22 0618 Oral     SpO2 02/03/22 0618 100 %     Weight 02/03/22 0618 220 lb (99.8 kg)     Height 02/03/22 0618 5\' 7"  (1.702 m)     Head Circumference --      Peak Flow --      Pain Score 02/03/22 0617 8     Pain Loc --      Pain Edu? --      Excl. in GC? --    Most recent vital signs: Vitals:   02/03/22 0618  BP: 120/79  Pulse: 85  Resp: 19  Temp: 98.5 F (36.9 C)  SpO2: 100%   General: Awake, oriented x4. CV:  Good peripheral perfusion.  Resp:  Normal effort.  Abd:  No distention.  Other:  Overweight middle-aged Caucasian female laying in bed in no acute distress ED Results / Procedures / Treatments  Labs (all labs ordered are listed, but only abnormal results are displayed) Labs Reviewed  COMPREHENSIVE METABOLIC PANEL  CBC WITH DIFFERENTIAL/PLATELET  TROPONIN I (HIGH SENSITIVITY)   EKG ED ECG REPORT I,  04/06/22, the attending physician, personally viewed and interpreted this ECG. Date: 02/03/2022 EKG Time: 0624 Rate: 87 Rhythm: normal sinus rhythm QRS Axis: normal Intervals: normal ST/T Wave abnormalities: normal Narrative Interpretation: no evidence of acute ischemia  PROCEDURES: Critical Care performed: No .1-3 Lead EKG Interpretation  Performed by: 0625, MD Authorized by: Merwyn Katos, MD     Interpretation: normal     ECG rate:  86   ECG rate assessment: normal     Rhythm: sinus rhythm     Ectopy: none     Conduction: normal    MEDICATIONS ORDERED IN ED: Medications - No data to display IMPRESSION / MDM / ASSESSMENT AND PLAN / ED COURSE  I reviewed the triage vital signs and the nursing notes.                             The patient is on the cardiac monitor to evaluate for evidence of arrhythmia and/or significant heart rate changes. Patient's presentation is most consistent with acute presentation with potential threat to life or bodily function. Workup: ECG, CXR, CBC, BMP, Troponin Findings: ECG: No overt evidence of STEMI. No evidence of Brugadas sign, delta wave, epsilon wave, significantly prolonged QTc, or malignant arrhythmia HS Troponin:  Negative x1 Other Labs unremarkable for emergent problems. CXR: Without PTX, PNA, or widened mediastinum Last Stress Test: Coronary CT 01/30/2022 Last Heart Catheterization: Never HEART Score: 1  Given History, Exam, and Workup I have low suspicion for ACS, Pneumothorax, Pneumonia, Pulmonary Embolus, Tamponade, Aortic Dissection or other emergent problem as a cause for this presentation.   Reassesment: Prior to discharge patients pain was controlled and they were well appearing.  Disposition:  Discharge. Strict return precautions discussed with patient with full understanding. Advised patient to follow up promptly with primary care provider    FINAL CLINICAL IMPRESSION(S) / ED DIAGNOSES   Final  diagnoses:  None   Rx / DC Orders   ED Discharge Orders     None      Note:  This document was prepared using Dragon voice recognition software and may include unintentional dictation errors.   Merwyn Katos, MD 02/03/22 [phone]

## 2022-02-04 ENCOUNTER — Telehealth: Payer: Self-pay | Admitting: *Deleted

## 2022-02-04 NOTE — Patient Outreach (Signed)
  Care Coordination TOC Note Transition Care Management Follow-up Telephone Call Date of discharge and from where: 02/03/22, [facility] How have you been since you were released from the hospital? Doing ok, patient stated that she has a lot of appointments this week and just wants to find out what is going on with her Any questions or concerns? Yes  Items Reviewed: Did the pt receive and understand the discharge instructions provided? Yes  Medications obtained and verified?  N/A Other? No  Any new allergies since your discharge? No  Dietary orders reviewed? No Do you have support at home? Yes   Home Care and Equipment/Supplies: Were home health services ordered? no If so, what is the name of the agency? N/A  Has the agency set up a time to come to the patient's home? not applicable Were any new equipment or medical supplies ordered?  No What is the name of the medical supply agency? N/A Were you able to get the supplies/equipment? not applicable Do you have any questions related to the use of the equipment or supplies? No  Functional Questionnaire: (I = Independent and D = Dependent) ADLs: I   Bathing/Dressing- I  Meal Prep- I  Eating- I  Maintaining continence- I  Transferring/Ambulation- I  Managing Meds- I  Follow up appointments reviewed:  PCP Hospital f/u appt confirmed? Yes  Scheduled to see Tillie Fantasia next week Specialist Hospital f/u appt confirmed? Yes  Scheduled to see ENT and Cardiology on 02/05/22 and GI on 02/06/22. Are transportation arrangements needed? No  If their condition worsens, is the pt aware to call PCP or go to the Emergency Dept.? Yes Was the patient provided with contact information for the PCP's office or ED? No Was to pt encouraged to call back with questions or concerns? Yes  SDOH assessments and interventions completed:   No  Care Coordination Interventions Activated:  No Care Coordination Interventions:    Encounter Outcome:  Pt. Visit  Completed  [name] RN, BSN [employer]  [facility] RN Care Coordinator

## 2022-02-05 DIAGNOSIS — R0789 Other chest pain: Secondary | ICD-10-CM | POA: Insufficient documentation

## 2022-02-06 ENCOUNTER — Ambulatory Visit (INDEPENDENT_AMBULATORY_CARE_PROVIDER_SITE_OTHER): Payer: Medicaid Other | Admitting: Gastroenterology

## 2022-02-06 ENCOUNTER — Encounter: Payer: Self-pay | Admitting: Gastroenterology

## 2022-02-06 ENCOUNTER — Other Ambulatory Visit: Payer: Self-pay

## 2022-02-06 VITALS — BP 115/80 | HR 71 | Temp 98.0°F | Ht 67.0 in | Wt 224.2 lb

## 2022-02-06 DIAGNOSIS — R1013 Epigastric pain: Secondary | ICD-10-CM | POA: Diagnosis not present

## 2022-02-06 DIAGNOSIS — K219 Gastro-esophageal reflux disease without esophagitis: Secondary | ICD-10-CM

## 2022-02-06 DIAGNOSIS — Z86010 Personal history of colonic polyps: Secondary | ICD-10-CM

## 2022-02-06 DIAGNOSIS — R0789 Other chest pain: Secondary | ICD-10-CM

## 2022-02-06 MED ORDER — NA SULFATE-K SULFATE-MG SULF 17.5-3.13-1.6 GM/177ML PO SOLN: 354.0000 mL | Freq: Once | ORAL | 0 refills | Status: AC

## 2022-02-07 ENCOUNTER — Encounter: Payer: Self-pay | Admitting: Gastroenterology

## 2022-02-07 NOTE — Progress Notes (Signed)
Arlyss Repress, MD [address]  [address]  [place], [place] [address]  Main: [phone]  Fax: [phone]    Gastroenterology Consultation  Referring Provider:     [name], PA Primary Care Physician:  [name], PA Primary Gastroenterologist:  Dr. Arlyss Repress Reason for Consultation: Atypical chest pain        HPI:   Kendra [name] is a 31 y.o. female referred by Dr. Mordecai Maes, Melton Alar, PA  for consultation & management of approximately 6 months history of worsening of burning pain in central part of the chest as well as epigastric area.  She went to the ER, last week due to worsening of the symptoms underwent twelve-lead EKG which was normal.  Troponins were negative.  CBC and CMP were normal.  She is started on Protonix 40 mg daily.  Patient was evaluated by ENT and was told that she probably has acid reflux and needed upper endoscopy.  Therefore, patient made an online appointment from patient portal  NSAIDs: None  Antiplts/Anticoagulants/Anti thrombotics: None  GI Procedures:  EGD and colonoscopy 12/14/2015 - Normal esophagus. - Normal stomach. - Normal examined duodenum. Biopsied.  - The entire examined colon is normal. - The examined portion of the ileum was normal. Biopsied. - Random biopsies were obtained in the entire colon.   Past Medical History:  Diagnosis Date   Anxiety    Depression    GERD (gastroesophageal reflux disease)    Headache    stress   Heart murmur    History of cannabis abuse    Seizures (HCC)    age 29. syncopal episode. Hit head. had seizure.    Tobacco abuse    Vitamin D deficiency     Past Surgical History:  Procedure Laterality Date   COLON SURGERY  2013   pt states "colonoscopy while awake"   COLONOSCOPY WITH PROPOFOL N/A 12/14/2015   Procedure: COLONOSCOPY WITH PROPOFOL;  Surgeon: Midge Minium, MD;  Location: [facility];  Service: Endoscopy;  Laterality: N/A;    ESOPHAGOGASTRODUODENOSCOPY (EGD) WITH PROPOFOL N/A 12/14/2015   Procedure: ESOPHAGOGASTRODUODENOSCOPY (EGD) WITH PROPOFOL;  Surgeon: Midge Minium, MD;  Location: [facility];  Service: Endoscopy;  Laterality: N/A;   LAPAROSCOPIC TUBAL LIGATION Bilateral 12/03/2021   Procedure: LAPAROSCOPIC TUBAL LIGATION;  Surgeon: Linzie Collin, MD;  Location: [facility];  Service: Gynecology;  Laterality: Bilateral;   WISDOM TOOTH EXTRACTION  2013     Current Outpatient Medications:    buprenorphine-naloxone (SUBOXONE) 8-2 mg SUBL SL tablet, Place 1 tablet under the tongue daily. Patient uses 8mg  film bid, Disp: , Rfl:    meloxicam (MOBIC) 15 MG tablet, Take 15 mg by mouth daily as needed., Disp: , Rfl:    methocarbamol (ROBAXIN) 500 MG tablet, Take by mouth., Disp: , Rfl:    pantoprazole (PROTONIX) 40 MG tablet, Take 1 tablet by mouth daily., Disp: , Rfl:    sertraline (ZOLOFT) 50 MG tablet, Take 100 mg by mouth daily., Disp: , Rfl:    Family History  Problem Relation Age of Onset   Depression Mother    Diabetes Maternal Grandfather    Congestive Heart Failure Maternal Grandfather    Diverticulitis Maternal Grandmother    Dementia Maternal Grandmother      Social History   Tobacco Use   Smoking status: Every Day    Packs/day: 1.00    Years: 8.00    Total pack years: 8.00    Types: Cigarettes  Smokeless tobacco: Never  Vaping Use   Vaping Use: Never used  Substance Use Topics   Alcohol use: No   Drug use: Not Currently    Types: Marijuana    Allergies as of 02/06/2022   (No Known Allergies)    Review of Systems:    All systems reviewed and negative except where noted in HPI.   Physical Exam:  BP 115/80 (BP Location: Left Arm, Patient Position: Sitting, Cuff Size: Normal)   Pulse 71   Temp 98 F (36.7 C) (Oral)   Ht 5\' 7"  (1.702 m)   Wt 224 lb 4 oz (101.7 kg)   LMP 01/11/2022 (Approximate)   BMI 35.12 kg/m  Patient's last menstrual period was 01/11/2022  (approximate).  General:   Alert,  Well-developed, well-nourished, pleasant and cooperative in NAD Head:  Normocephalic and atraumatic. Eyes:  Sclera clear, no icterus.   Conjunctiva pink. Ears:  Normal auditory acuity. Nose:  No deformity, discharge, or lesions. Mouth:  No deformity or lesions,oropharynx pink & moist. Neck:  Supple; no masses or thyromegaly. Lungs:  Respirations even and unlabored.  Clear throughout to auscultation.   No wheezes, crackles, or rhonchi. No acute distress. Heart:  Regular rate and rhythm; no murmurs, clicks, rubs, or gallops. Abdomen:  Normal bowel sounds. Soft, non-tender and non-distended without masses, hepatosplenomegaly or hernias noted.  No guarding or rebound tenderness.   Rectal: Not performed Msk:  Symmetrical without gross deformities. Good, equal movement & strength bilaterally. Pulses:  Normal pulses noted. Extremities:  No clubbing or edema.  No cyanosis. Neurologic:  Alert and oriented x3;  grossly normal neurologically. Skin:  Intact without significant lesions or rashes. No jaundice. Psych:  Alert and cooperative. Normal mood and affect.  Imaging Studies: Reviewed  Assessment and Plan:   Kendra [name] is a 31 y.o. female with history of obesity, BMI 35, anxiety, chronic GERD is seen in consultation for worsening of GERD symptoms, on long-term PPI  Recommend EGD for further evaluation including esophageal biopsies and gastric biopsies Continue Protonix 40 mg 1-2 times daily before meals Discussed about GERD lifestyle   Follow up as needed   37, MD

## 2022-02-08 ENCOUNTER — Other Ambulatory Visit: Payer: Self-pay

## 2022-02-08 ENCOUNTER — Emergency Department
Admission: EM | Admit: 2022-02-08 | Discharge: 2022-02-08 | Disposition: A | Discharge status: Home / Self Care | Payer: Medicaid Other | Attending: Emergency Medicine | Admitting: Emergency Medicine

## 2022-02-08 ENCOUNTER — Telehealth: Payer: Self-pay

## 2022-02-08 ENCOUNTER — Emergency Department: Payer: Medicaid Other

## 2022-02-08 DIAGNOSIS — R079 Chest pain, unspecified: Secondary | ICD-10-CM

## 2022-02-08 LAB — BASIC METABOLIC PANEL
Anion gap: 9 (ref 5–15)
BUN: 11 mg/dL (ref 6–20)
CO2: 27 mmol/L (ref 22–32)
Calcium: 9.8 mg/dL (ref 8.9–10.3)
Chloride: 104 mmol/L (ref 98–111)
Creatinine, Ser: 0.88 mg/dL (ref 0.44–1.00)
GFR, Estimated: >60 mL/min (ref >60)
Glucose, Bld: 88 mg/dL (ref 70–99)
Potassium: 3.8 mmol/L (ref 3.5–5.1)
Sodium: 140 mmol/L (ref 135–145)

## 2022-02-08 LAB — CBC
HCT: 44.7 % (ref 36.0–46.0)
Hemoglobin: 14.8 g/dL (ref 12.0–15.0)
MCH: 32.8 pg (ref 26.0–34.0)
MCHC: 33.1 g/dL (ref 30.0–36.0)
MCV: 99.1 fL (ref 80.0–100.0)
Platelets: 295 10*3/uL (ref 150–400)
RBC: 4.51 MIL/uL (ref 3.87–5.11)
RDW: 11.9 % (ref 11.5–15.5)
WBC: 6.8 10*3/uL (ref 4.0–10.5)
nRBC: 0 % (ref 0.0–0.2)

## 2022-02-08 LAB — TROPONIN I (HIGH SENSITIVITY)
Troponin I (High Sensitivity): 3 ng/L (ref <18)
Troponin I (High Sensitivity): 3 ng/L (ref <18)

## 2022-02-08 NOTE — ED Triage Notes (Signed)
Patient to ER via POV with complaints of left sided chest pain, she describes it as sharp and pressure. Reports it radiates under her left breast and into her left shoulder. States the pain started at rest. Reports hx of CP.   States she has a endoscopy schedule to rule out GI issues.

## 2022-02-08 NOTE — Telephone Encounter (Signed)
Patient verbalized understanding. Gave her the instructions for the EGD  also sent to [facility]

## 2022-02-08 NOTE — ED Provider Triage Note (Signed)
Emergency Medicine Provider Triage Evaluation Note  Kendra [name] , a 31 y.o. female  was evaluated in triage.  Pt complains of left chest wall pain, pain under left breast, and left shoulder pain. Symptoms started this afternoon. No cardiac history, but has had similar symptoms in the past. Endoscopy scheduled. No alleviating measures prior to arrival.   Physical Exam  LMP 01/11/2022 (Approximate)  Gen:   Awake, no distress   Resp:  Normal effort  MSK:   Moves extremities without difficulty  Other:    Medical Decision Making  Medically screening exam initiated at 6:55 PM.  Appropriate orders placed.  Kendra [name] was informed that the remainder of the evaluation will be completed by another provider, this initial triage assessment does not replace that evaluation, and the importance of remaining in the ED until their evaluation is complete.    Chinita Pester, FNP 02/08/22 1856

## 2022-02-08 NOTE — Telephone Encounter (Signed)
-----   Message from Toney Reil, MD sent at 02/08/2022  8:18 AM EDT ----- Regarding: RE: Colonoscopy Got it, she had a colonoscopy in 2017 by Dr. Servando Snare and it was completely normal with adequate prep.  There is no indication for repeat colonoscopy.  Could you please discuss with patient and let her know  Thanks RV ----- Message ----- From: Denman George, CMA Sent: 02/08/2022   7:38 AM EDT To: Toney Reil, MD Subject: FW: Colonoscopy                                You told me to do a colonoscopy for History of colon polyps and when I went in the room she said she has to repeat it since she had colon polyps removed when she was 20 ----- Message ----- From: Toney Reil, MD Sent: 02/07/2022   6:11 PM EDT To: Denman George, CMA Subject: Colonoscopy                                    [name]  I do not remember discussing about colonoscopy on this patient.  She needs only EGD  I also messaged patient via MyChart, waiting for her response  Thanks RV

## 2022-02-08 NOTE — ED Provider Notes (Signed)
   [facility] Provider Note    Event Date/Time   First MD Initiated Contact with Patient 02/08/22 2048     (approximate)   History   Chest Pain   HPI  Kendra [name] is a 31 y.o. female presents to the emergency department for treatment and evaluation of left side chest pain.  Pain is sharp and radiates under her left breast and shoulder.  Pain started while she was just sitting on the couch.  Has a history of the same and has an endoscopy scheduled to rule out GI issues.  Past Medical History:  Diagnosis Date  . Anxiety   . Depression   . GERD (gastroesophageal reflux disease)   . Headache    stress  . Heart murmur   . History of cannabis abuse   . Seizures (HCC)    age 33. syncopal episode. Hit head. had seizure.   . Tobacco abuse   . Vitamin D deficiency      Physical Exam   Triage Vital Signs: ED Triage Vitals [02/08/22 1855]  Enc Vitals Group     BP 130/83     Pulse Rate 82     Resp 18     Temp 98 F (36.7 C)     Temp Source Oral     SpO2 100 %     Weight 224 lb 4 oz (101.7 kg)     Height 5\' 7"  (1.702 m)     Head Circumference      Peak Flow      Pain Score 8     Pain Loc      Pain Edu?      Excl. in GC?     Most recent vital signs: Vitals:   02/08/22 1855  BP: 130/83  Pulse: 82  Resp: 18  Temp: 98 F (36.7 C)  SpO2: 100%    General: Awake, no distress.  CV:  Good peripheral perfusion.  Resp:  Normal effort.  Abd:  No distention.  Other:     ED Results / Procedures / Treatments   Labs (all labs ordered are listed, but only abnormal results are displayed) Labs Reviewed  BASIC METABOLIC PANEL  CBC  POC URINE PREG, ED  TROPONIN I (HIGH SENSITIVITY)  TROPONIN I (HIGH SENSITIVITY)     EKG  ***   RADIOLOGY  ***  I have independently reviewed and interpreted imaging as well as reviewed report from radiology.  PROCEDURES:  Critical Care performed: {CriticalCareYesNo:19197::"Yes, see critical  care procedure note(s)","No"}  Procedures   MEDICATIONS ORDERED IN ED:  Medications - No data to display   IMPRESSION / MDM / ASSESSMENT AND PLAN / ED COURSE   I reviewed the triage vital signs and the nursing notes.  Differential diagnosis includes, but is not limited to: ***  Patient's presentation is most consistent with {EM COPA:27473}  {**The patient is on the cardiac monitor to evaluate for evidence of arrhythmia and/or significant heart rate changes.**}      FINAL CLINICAL IMPRESSION(S) / ED DIAGNOSES   Final diagnoses:  None     Rx / DC Orders   ED Discharge Orders     None        Note:  This document was prepared using Dragon voice recognition software and may include unintentional dictation errors.

## 2022-02-08 NOTE — Telephone Encounter (Signed)
Called patient and left a message for call back  

## 2022-02-08 NOTE — ED Notes (Signed)
Pt requesting to stay until troponin results due to chest pain- will let pt remain in room until results come back.

## 2022-02-08 NOTE — Discharge Instructions (Signed)
Please take your Robaxin that is already prescribed as needed for pain.  Follow-up with your primary care provider next week.  Return to the emergency department for symptoms of change or worsen if you are unable to schedule appointment.

## 2022-02-08 NOTE — ED Notes (Signed)
Pt refusing CXR until seen by provider.

## 2022-02-08 NOTE — ED Notes (Signed)
E signature pad not working. Pt educated on discharge instructions and verbalized understanding.  

## 2022-02-11 ENCOUNTER — Other Ambulatory Visit: Payer: Self-pay

## 2022-02-11 ENCOUNTER — Encounter: Payer: Self-pay | Admitting: Gastroenterology

## 2022-02-11 ENCOUNTER — Telehealth: Payer: Self-pay | Admitting: *Deleted

## 2022-02-11 NOTE — Patient Outreach (Signed)
Care Coordination  02/11/2022  ZENNIE AYARS 09-22-1990 [id number]  Transition Care Management Follow-up Telephone Call Date of discharge and from where: 02/08/22, [facility] How have you been since you were released from the hospital? Patient continues to have chest pain. Any questions or concerns? Yes, Patient would like to find a new PCP and has an appointment in September  Items Reviewed: Did the pt receive and understand the discharge instructions provided? Yes  Medications obtained and verified? Yes  Other? Yes  Any new allergies since your discharge? No  Dietary orders reviewed? Yes Do you have support at home? Yes   Home Care and Equipment/Supplies: Were home health services ordered? no If so, what is the name of the agency? N/A  Has the agency set up a time to come to the patient's home? not applicable Were any new equipment or medical supplies ordered?  No What is the name of the medical supply agency? N/A Were you able to get the supplies/equipment? not applicable Do you have any questions related to the use of the equipment or supplies? No  Functional Questionnaire: (I = Independent and D = Dependent) ADLs: I  Bathing/Dressing- I  Meal Prep- I  Eating- I  Maintaining continence- I  Transferring/Ambulation- I  Managing Meds- I  Follow up appointments reviewed:  PCP Hospital f/u appt confirmed? Yes  Scheduled to see [facility] on 02/13/22. Specialist Hospital f/u appt confirmed? Yes  Scheduled to see GI for EGD on 02/21/22. Are transportation arrangements needed? No  If their condition worsens, is the pt aware to call PCP or go to the Emergency Dept.? Yes Was the patient provided with contact information for the PCP's office or ED? No Was to pt encouraged to call back with questions or concerns? Yes

## 2022-02-13 ENCOUNTER — Emergency Department: Payer: Medicaid Other

## 2022-02-13 ENCOUNTER — Other Ambulatory Visit: Payer: Self-pay

## 2022-02-13 ENCOUNTER — Emergency Department
Admission: EM | Admit: 2022-02-13 | Discharge: 2022-02-13 | Disposition: A | Discharge status: Home / Self Care | Payer: Medicaid Other | Attending: Emergency Medicine | Admitting: Emergency Medicine

## 2022-02-13 DIAGNOSIS — R0602 Shortness of breath: Secondary | ICD-10-CM | POA: Insufficient documentation

## 2022-02-13 DIAGNOSIS — R0789 Other chest pain: Secondary | ICD-10-CM | POA: Diagnosis present

## 2022-02-13 LAB — BASIC METABOLIC PANEL
Anion gap: 3 — ABNORMAL LOW (ref 5–15)
BUN: 10 mg/dL (ref 6–20)
CO2: 28 mmol/L (ref 22–32)
Calcium: 9.3 mg/dL (ref 8.9–10.3)
Chloride: 108 mmol/L (ref 98–111)
Creatinine, Ser: 0.93 mg/dL (ref 0.44–1.00)
GFR, Estimated: >60 mL/min (ref >60)
Glucose, Bld: 103 mg/dL — ABNORMAL HIGH (ref 70–99)
Potassium: 3.8 mmol/L (ref 3.5–5.1)
Sodium: 139 mmol/L (ref 135–145)

## 2022-02-13 LAB — CBC
HCT: 38.3 % (ref 36.0–46.0)
Hemoglobin: 12.9 g/dL (ref 12.0–15.0)
MCH: 33.1 pg (ref 26.0–34.0)
MCHC: 33.7 g/dL (ref 30.0–36.0)
MCV: 98.2 fL (ref 80.0–100.0)
Platelets: 274 10*3/uL (ref 150–400)
RBC: 3.9 MIL/uL (ref 3.87–5.11)
RDW: 12.1 % (ref 11.5–15.5)
WBC: 4.9 10*3/uL (ref 4.0–10.5)
nRBC: 0 % (ref 0.0–0.2)

## 2022-02-13 LAB — TROPONIN I (HIGH SENSITIVITY): Troponin I (High Sensitivity): 3 ng/L (ref <18)

## 2022-02-13 LAB — POC URINE PREG, ED: Preg Test, Ur: NEGATIVE

## 2022-02-13 MED ORDER — IPRATROPIUM-ALBUTEROL 0.5-2.5 (3) MG/3ML IN SOLN
3.0000 mL | Freq: Once | RESPIRATORY_TRACT | Status: AC
  Administered 2022-02-13: 3 mL via RESPIRATORY_TRACT
  Filled 2022-02-13: qty 3

## 2022-02-13 NOTE — ED Provider Notes (Signed)
[facility] Provider Note    Event Date/Time   First MD Initiated Contact with Patient 02/13/22 1325     (approximate)   History   Chest Pain and Shortness of Breath   HPI  Kendra [name] is a 31 y.o. female who comes in complaining of chest pain and tightness since this morning.  About an hour and a half ago began feeling shortness of breath.  She is come to the ER repeat immediately for chest pain.  But this time is feels different.      Physical Exam   Triage Vital Signs: ED Triage Vitals  Enc Vitals Group     BP 02/13/22 1241 129/86     Pulse Rate 02/13/22 1241 92     Resp 02/13/22 1241 16     Temp 02/13/22 1241 97.9 F (36.6 C)     Temp Source 02/13/22 1241 Oral     SpO2 02/13/22 1241 95 %     Weight 02/13/22 1237 220 lb (99.8 kg)     Height 02/13/22 1237 5\' 7"  (1.702 m)     Head Circumference --      Peak Flow --      Pain Score 02/13/22 1236 9     Pain Loc --      Pain Edu? --      Excl. in GC? --     Most recent vital signs: Vitals:   02/13/22 1330 02/13/22 1350  BP: 111/67 109/65  Pulse: 91 92  Resp:  16  Temp:    SpO2: 100% 96%    General: Awake, no distress.  CV:  Good peripheral perfusion.  Heart regular rate and rhythm no audible murmurs Resp:  Normal effort.  Lungs are clear with the exception of a new localized wheezing on the left upper lobe. Abd:  No distention.  Soft nontender no organomegaly    ED Results / Procedures / Treatments   Labs (all labs ordered are listed, but only abnormal results are displayed) Labs Reviewed  BASIC METABOLIC PANEL - Abnormal; Notable for the following components:      Result Value   Glucose, Bld 103 (*)    Anion gap 3 (*)    All other components within normal limits  CBC  POC URINE PREG, ED  TROPONIN I (HIGH SENSITIVITY)  TROPONIN I (HIGH SENSITIVITY)     EKG EKG read and interpreted by me shows normal sinus rhythm rate of 80 normal axis essentially normal EKG no  obvious change from prior    RADIOLOGY Chest x-ray read by radiology reviewed and interpreted by me shows nothing in the chest no cause for the localized wheeze   PROCEDURES:  Critical Care performed:   Procedures   MEDICATIONS ORDERED IN ED: Medications  ipratropium-albuterol (DUONEB) 0.5-2.5 (3) MG/3ML nebulizer solution 3 mL (3 mLs Nebulization Given 02/13/22 1403)     IMPRESSION / MDM / ASSESSMENT AND PLAN / ED COURSE  I reviewed the triage vital signs and the nursing notes. ----------------------------------------- 3:18 PM on 02/13/2022 ----------------------------------------- Wheezing in the left upper lobe is now clear  There is no sign of any pulmonary or heart pathology at this point.  Patient has multiple visits for the same.  I will have her continue following up with her regular doctor.  Likely this is atypical chest pain with element of anxiety.  Patient's presentation is most consistent with acute complicated illness / injury requiring diagnostic workup.  The patient is on the  cardiac monitor to evaluate for evidence of arrhythmia and/or significant heart rate changes.  None were seen      FINAL CLINICAL IMPRESSION(S) / ED DIAGNOSES   Final diagnoses:  Other chest pain     Rx / DC Orders   ED Discharge Orders     None        Note:  This document was prepared using Dragon voice recognition software and may include unintentional dictation errors.   Arnaldo Natal, MD 02/13/22 1520

## 2022-02-13 NOTE — ED Triage Notes (Signed)
Pt to ED for central non-radiating chest pain since this AM when woke up. States about 1.5 hour ago began feeling SOB. States has hx panic attacks and has come to ER several times for chest pain.  States she gets chest pain every day and also feels weak "like I'm gonna collapse".

## 2022-02-13 NOTE — Discharge Instructions (Addendum)
Please follow-up with your regular doctor.  Return for any marked shortness of breath or different chest pain or fever.  We did not find anything on the chest x-ray or the EKG or the lab work today.  It is likely that the episodes of chest discomfort you are feeling are nothing significant.  I would have your doctor keep an eye on you though.

## 2022-02-18 ENCOUNTER — Other Ambulatory Visit: Payer: Self-pay | Admitting: Obstetrics and Gynecology

## 2022-02-18 NOTE — Patient Instructions (Signed)
Hey Ms. Revonda Standard, I hope you are doing okay, I'm sorry I didn't reach you today - as a part of your Medicaid benefit, you are eligible for care management and care coordination services at no cost or copay. I was unable to reach you by phone today but would be happy to help you with your health related needs. Please feel free to call me at [phone].  A member of the Managed Medicaid care management team will reach out to you again over the next 30 business  days.   Kathi Der RN, BSN [place]  [employer], [employer] - Managed Medicaid High Risk [phone].

## 2022-02-18 NOTE — Patient Outreach (Signed)
Care Coordination  02/18/2022  Kendra [name] 01-Mar-1991 [id number]   Medicaid Managed Care   Unsuccessful Outreach Note  02/18/2022 Name: Kendra [name] MRN: [id number] DOB: Dec 06, 1990  Referred by: [name], PA Reason for referral : High Risk Managed Medicaid (Unsuccessful telephone outreach)   A second unsuccessful telephone outreach was attempted today. The patient was referred to the case management team for assistance with care management and care coordination.   Follow Up Plan: The care management team will reach out to the patient again over the next 30 business  days.   Kathi Der RN, BSN [place]  [employer] - Managed Medicaid High Risk [phone]

## 2022-02-19 ENCOUNTER — Other Ambulatory Visit: Payer: Self-pay

## 2022-02-19 ENCOUNTER — Telehealth: Payer: Self-pay | Admitting: *Deleted

## 2022-02-19 DIAGNOSIS — R0789 Other chest pain: Secondary | ICD-10-CM | POA: Diagnosis not present

## 2022-02-19 DIAGNOSIS — R079 Chest pain, unspecified: Secondary | ICD-10-CM | POA: Diagnosis present

## 2022-02-19 LAB — BASIC METABOLIC PANEL
Anion gap: 7 (ref 5–15)
BUN: 12 mg/dL (ref 6–20)
CO2: 27 mmol/L (ref 22–32)
Calcium: 9.5 mg/dL (ref 8.9–10.3)
Chloride: 104 mmol/L (ref 98–111)
Creatinine, Ser: 0.91 mg/dL (ref 0.44–1.00)
GFR, Estimated: >60 mL/min (ref >60)
Glucose, Bld: 78 mg/dL (ref 70–99)
Potassium: 3.5 mmol/L (ref 3.5–5.1)
Sodium: 138 mmol/L (ref 135–145)

## 2022-02-19 LAB — CBC
HCT: 40.4 % (ref 36.0–46.0)
Hemoglobin: 13.7 g/dL (ref 12.0–15.0)
MCH: 33 pg (ref 26.0–34.0)
MCHC: 33.9 g/dL (ref 30.0–36.0)
MCV: 97.3 fL (ref 80.0–100.0)
Platelets: 250 10*3/uL (ref 150–400)
RBC: 4.15 MIL/uL (ref 3.87–5.11)
RDW: 11.9 % (ref 11.5–15.5)
WBC: 8.3 10*3/uL (ref 4.0–10.5)
nRBC: 0 % (ref 0.0–0.2)

## 2022-02-19 LAB — TROPONIN I (HIGH SENSITIVITY): Troponin I (High Sensitivity): 2 ng/L (ref <18)

## 2022-02-19 NOTE — ED Provider Triage Note (Signed)
Emergency Medicine Provider Triage Evaluation Note  Kendra [name] , a 31 y.o. female  was evaluated in triage.  Pt complains of central and right-sided chest pain that radiates to the left shoulder.  Patient states that she felt like her heart was racing earlier.  She is a daily smoker but denies history of PE.  She denies chest tightness but states that she has had some associated shortness of breath.  No nausea, vomiting or abdominal pain.  She reports that she has had similar pain in the past.  Review of Systems  Positive: Patient has chest pain.  Negative: No vomiting or abdominal pain  Physical Exam  LMP 02/08/2022 (Approximate)  Gen:   Awake, no distress   Resp:  Normal effort  MSK:   Moves extremities without difficulty  Other:    Medical Decision Making  Medically screening exam initiated at 10:54 PM.  Appropriate orders placed.  Kendra [name] was informed that the remainder of the evaluation will be completed by another provider, this initial triage assessment does not replace that evaluation, and the importance of remaining in the ED until their evaluation is complete.     Pia Mau [place], [place] 02/19/22 2255

## 2022-02-19 NOTE — ED Notes (Signed)
Pt refusing CXR until seen by EDP.  Pt states "I've had lots of tests recently and only want the xray if they say I need it."

## 2022-02-19 NOTE — Patient Outreach (Signed)
Care Coordination  02/19/2022  ARTHURINE OLEARY February 08, 1991 [id number]  Transition Care Management Follow-up Telephone Call Date of discharge and from where: 02/13/22, [facility] How have you been since you were released from the hospital? "I'm still having some pain" Any questions or concerns? No  Items Reviewed: Did the pt receive and understand the discharge instructions provided? Yes  Medications obtained and verified?  No new prescriptions  Other? No  Any new allergies since your discharge? No  Dietary orders reviewed? No Do you have support at home? Yes   Home Care and Equipment/Supplies: Were home health services ordered? no If so, what is the name of the agency? N/A  Has the agency set up a time to come to the patient's home? not applicable Were any new equipment or medical supplies ordered?  No What is the name of the medical supply agency? N/A Were you able to get the supplies/equipment? not applicable Do you have any questions related to the use of the equipment or supplies? No  Functional Questionnaire: (I = Independent and D = Dependent) ADLs: I  Bathing/Dressing- I  Meal Prep- I  Eating- I  Maintaining continence- I  Transferring/Ambulation- I  Managing Meds- I  Follow up appointments reviewed:  PCP Hospital f/u appt confirmed? Yes  Patient was seen by PCP on 02/14/22-per her report . Specialist Hospital f/u appt confirmed? Yes  Scheduled to see GI for EGD on 02/21/22 . Are transportation arrangements needed? No  If their condition worsens, is the pt aware to call PCP or go to the Emergency Dept.? Yes Was the patient provided with contact information for the PCP's office or ED? No Was to pt encouraged to call back with questions or concerns? Yes  [name] RN, BSN [place]  [employer]

## 2022-02-19 NOTE — ED Notes (Signed)
Attempted blood draw x2 without success.  Lab called for blood draw at this time.

## 2022-02-19 NOTE — ED Triage Notes (Signed)
Pt presents to ER from home c/o left-center chest pain x1 hr.  Pt states chest pain radiates to left shoulder and to back.  Pt endorses some sob.  Pt states this chest pain is "more painful than it has been previously."  Pt denies anxiety or recent panic attacks.  Pt otherwise A&O x4 at this time in NAD in triage.

## 2022-02-20 ENCOUNTER — Emergency Department
Admission: EM | Admit: 2022-02-20 | Discharge: 2022-02-20 | Disposition: A | Discharge status: Home / Self Care | Payer: Medicaid Other | Attending: Emergency Medicine | Admitting: Emergency Medicine

## 2022-02-20 DIAGNOSIS — R0789 Other chest pain: Secondary | ICD-10-CM

## 2022-02-20 LAB — TROPONIN I (HIGH SENSITIVITY): Troponin I (High Sensitivity): 2 ng/L (ref <18)

## 2022-02-20 MED ORDER — KETOROLAC TROMETHAMINE 30 MG/ML IJ SOLN
30.0000 mg | Freq: Once | INTRAMUSCULAR | Status: AC
  Administered 2022-02-20: 30 mg via INTRAMUSCULAR
  Filled 2022-02-20: qty 1

## 2022-02-20 MED ORDER — PHENYLEPHRINE HCL (PRESSORS) 10 MG/ML IV SOLN
INTRAVENOUS | Status: AC
  Filled 2022-02-20: qty 1

## 2022-02-20 NOTE — ED Provider Notes (Signed)
[facility] Provider Note    Event Date/Time   First MD Initiated Contact with Patient 02/20/22 [phone]     (approximate)   History   Chest Pain   HPI  Kendra [name] is a 31 y.o. female who presents to the ED for evaluation of Chest Pain   I reviewed recurrent ED visits for chest pain.  She has a cardiologist and has had stress test, echoes and CT scans without evidence of acute pathology.  She presents to the ED for evaluation of left-sided chest pain.  Symptoms present for a few hours by time evaluate the patient, but improving.  Denies any falls or injuries, shortness of breath, emesis, syncope or abdominal pain.   Physical Exam   Triage Vital Signs: ED Triage Vitals  Enc Vitals Group     BP 02/19/22 2254 (!) 127/93     Pulse Rate 02/19/22 2254 94     Resp 02/19/22 2254 18     Temp 02/19/22 2254 97.9 F (36.6 C)     Temp Source 02/19/22 2254 Oral     SpO2 02/19/22 2254 96 %     Weight --      Height --      Head Circumference --      Peak Flow --      Pain Score 02/19/22 2303 8     Pain Loc --      Pain Edu? --      Excl. in GC? --     Most recent vital signs: Vitals:   02/20/22 0504 02/20/22 0530  BP: 120/74 128/72  Pulse: 73 71  Resp: 16 12  Temp: 97.8 F (36.6 C)   SpO2: 93% 99%    General: Awake, no distress.  CV:  Good peripheral perfusion.  RRR without appreciable murmur Resp:  Normal effort.  CTA B Abd:  No distention.  MSK:  No deformity noted.  Tenderness to palpation reproducing her symptoms, no overlying skin changes or signs of trauma. Neuro:  No focal deficits appreciated. Other:     ED Results / Procedures / Treatments   Labs (all labs ordered are listed, but only abnormal results are displayed) Labs Reviewed  BASIC METABOLIC PANEL  CBC  POC URINE PREG, ED  TROPONIN I (HIGH SENSITIVITY)  TROPONIN I (HIGH SENSITIVITY)    EKG Sinus rhythm with a rate of 88 bpm.  Normal axis and intervals.   Nonspecific ST changes isolated to lead III without reciprocal changes or further stigmata of acute ischemia.  RADIOLOGY   Official radiology report(s): No results found.  PROCEDURES and INTERVENTIONS:  .1-3 Lead EKG Interpretation  Performed by: Delton Prairie, MD Authorized by: Delton Prairie, MD     Interpretation: normal     ECG rate:  72   ECG rate assessment: normal     Rhythm: sinus rhythm     Ectopy: none     Conduction: normal     Medications  ketorolac (TORADOL) 30 MG/ML injection 30 mg (30 mg Intramuscular Given 02/20/22 0554)     IMPRESSION / MDM / ASSESSMENT AND PLAN / ED COURSE  I reviewed the triage vital signs and the nursing notes.  Differential diagnosis includes, but is not limited to, ACS, PTX, PNA, muscle strain/spasm, PE, dissection  {Patient presents with symptoms of an acute illness or injury that is potentially life-threatening.  31 year old female presents with chest pain.  She is PERC negative and PE is unlikely.  Does not take  any OCPs.  Reassuring EKG.  Normal CBC and BMP.  2 negative troponins.  Improving pain after Toradol.  Doubt cardiopulmonary pathology.  Consider observation, but ultimately we will discharge with return precautions.  Discussed following up with her cardiologist and return precautions.      FINAL CLINICAL IMPRESSION(S) / ED DIAGNOSES   Final diagnoses:  Other chest pain     Rx / DC Orders   ED Discharge Orders     None        Note:  This document was prepared using Dragon voice recognition software and may include unintentional dictation errors.   Delton Prairie, MD 02/20/22 [phone]

## 2022-02-21 ENCOUNTER — Other Ambulatory Visit: Payer: Self-pay

## 2022-02-21 ENCOUNTER — Encounter
Admission: RE | Disposition: A | Discharge status: Home / Self Care | Payer: Self-pay | Source: Home / Self Care | Attending: Gastroenterology

## 2022-02-21 ENCOUNTER — Ambulatory Visit: Payer: Medicaid Other | Admitting: Anesthesiology

## 2022-02-21 ENCOUNTER — Encounter: Payer: Self-pay | Admitting: Gastroenterology

## 2022-02-21 ENCOUNTER — Ambulatory Visit
Admission: RE | Admit: 2022-02-21 | Discharge: 2022-02-21 | Disposition: A | Discharge status: Home / Self Care | Payer: Medicaid Other | Attending: Gastroenterology | Admitting: Gastroenterology

## 2022-02-21 DIAGNOSIS — K3189 Other diseases of stomach and duodenum: Secondary | ICD-10-CM | POA: Insufficient documentation

## 2022-02-21 DIAGNOSIS — F17210 Nicotine dependence, cigarettes, uncomplicated: Secondary | ICD-10-CM | POA: Insufficient documentation

## 2022-02-21 DIAGNOSIS — K219 Gastro-esophageal reflux disease without esophagitis: Secondary | ICD-10-CM | POA: Diagnosis present

## 2022-02-21 DIAGNOSIS — Z86010 Personal history of colonic polyps: Secondary | ICD-10-CM

## 2022-02-21 DIAGNOSIS — K319 Disease of stomach and duodenum, unspecified: Secondary | ICD-10-CM | POA: Diagnosis not present

## 2022-02-21 HISTORY — PX: ESOPHAGOGASTRODUODENOSCOPY (EGD) WITH PROPOFOL: SHX5813

## 2022-02-21 LAB — POCT PREGNANCY, URINE: Preg Test, Ur: NEGATIVE

## 2022-02-21 SURGERY — ESOPHAGOGASTRODUODENOSCOPY (EGD) WITH PROPOFOL
Anesthesia: General | Site: Mouth

## 2022-02-21 MED ORDER — SODIUM CHLORIDE 0.9 % IV SOLN: INTRAVENOUS | Status: DC

## 2022-02-21 MED ORDER — ONDANSETRON HCL 4 MG/2ML IJ SOLN: 4.0000 mg | Freq: Once | INTRAMUSCULAR | Status: DC | PRN

## 2022-02-21 MED ORDER — LACTATED RINGERS IV SOLN: INTRAVENOUS | Status: DC

## 2022-02-21 MED ORDER — STERILE WATER FOR IRRIGATION IR SOLN
Status: DC | PRN
  Administered 2022-02-21: 50 mL

## 2022-02-21 MED ORDER — LIDOCAINE HCL (CARDIAC) PF 100 MG/5ML IV SOSY
PREFILLED_SYRINGE | INTRAVENOUS | Status: DC | PRN
  Administered 2022-02-21: 40 mg via INTRAVENOUS

## 2022-02-21 MED ORDER — ACETAMINOPHEN 160 MG/5ML PO SOLN: 325.0000 mg | ORAL | Status: DC | PRN

## 2022-02-21 MED ORDER — GLYCOPYRROLATE 0.2 MG/ML IJ SOLN
INTRAMUSCULAR | Status: DC | PRN
  Administered 2022-02-21: .1 mg via INTRAVENOUS

## 2022-02-21 MED ORDER — ACETAMINOPHEN 325 MG PO TABS: 325.0000 mg | ORAL_TABLET | ORAL | Status: DC | PRN

## 2022-02-21 MED ORDER — PROPOFOL 10 MG/ML IV BOLUS
INTRAVENOUS | Status: DC | PRN
  Administered 2022-02-21: 80 mg via INTRAVENOUS
  Administered 2022-02-21 (×2): 50 mg via INTRAVENOUS
  Administered 2022-02-21 (×2): 30 mg via INTRAVENOUS

## 2022-02-21 SURGICAL SUPPLY — 8 items
BLOCK BITE 60FR ADLT L/F GRN (MISCELLANEOUS) ×2 IMPLANT
FORCEPS BIOP RAD 4 LRG CAP 4 (CUTTING FORCEPS) ×1 IMPLANT
GOWN CVR UNV OPN BCK APRN NK (MISCELLANEOUS) ×2 IMPLANT
GOWN ISOL THUMB LOOP REG UNIV (MISCELLANEOUS) ×4
KIT PRC NS LF DISP ENDO (KITS) ×1 IMPLANT
KIT PROCEDURE OLYMPUS (KITS) ×2
MANIFOLD NEPTUNE II (INSTRUMENTS) ×2 IMPLANT
WATER STERILE IRR 250ML POUR (IV SOLUTION) ×2 IMPLANT

## 2022-02-21 NOTE — Op Note (Signed)
[facility]astroenterology Patient Name: Kendra [name] Procedure Date: 02/21/2022 7:00 AM MRN: [id number] Account #: [id number] Date of Birth: 12/02/1990 Admit Type: Outpatient Age: 31 Room: [facility] OR ROOM 01 Gender: Female Note Status: Finalized Instrument Name: [id number] Procedure:             Upper GI endoscopy Indications:           Heartburn, Esophageal reflux symptoms that recur                         despite appropriate therapy Providers:             Toney Reil MD, MD Referring MD:          Melton Alar. Mordecai Maes (Referring MD) Medicines:             General Anesthesia Complications:         No immediate complications. Estimated blood loss: None. Procedure:             Pre-Anesthesia Assessment:                        - Prior to the procedure, a History and Physical was                         performed, and patient medications and allergies were                         reviewed. The patient is competent. The risks and                         benefits of the procedure and the sedation options and                         risks were discussed with the patient. All questions                         were answered and informed consent was obtained.                         Patient identification and proposed procedure were                         verified by the physician, the nurse, the                         anesthesiologist, the anesthetist and the technician                         in the pre-procedure area in the procedure room in the                         endoscopy suite. Mental Status Examination: alert and                         oriented. Airway Examination: normal oropharyngeal                         airway and neck mobility. Respiratory Examination:  clear to auscultation. CV Examination: normal.                         Prophylactic Antibiotics: The patient does not require                         prophylactic  antibiotics. Prior Anticoagulants: The                         patient has taken no previous anticoagulant or                         antiplatelet agents. ASA Grade Assessment: II - A                         patient with mild systemic disease. After reviewing                         the risks and benefits, the patient was deemed in                         satisfactory condition to undergo the procedure. The                         anesthesia plan was to use general anesthesia.                         Immediately prior to administration of medications,                         the patient was re-assessed for adequacy to receive                         sedatives. The heart rate, respiratory rate, oxygen                         saturations, blood pressure, adequacy of pulmonary                         ventilation, and response to care were monitored                         throughout the procedure. The physical status of the                         patient was re-assessed after the procedure.                        After obtaining informed consent, the endoscope was                         passed under direct vision. Throughout the procedure,                         the patient's blood pressure, pulse, and oxygen                         saturations were monitored continuously. The Endoscope  was introduced through the mouth, and advanced to the                         second part of duodenum. The upper GI endoscopy was                         accomplished without difficulty. The patient tolerated                         the procedure well. Findings:      The duodenal bulb and second portion of the duodenum were normal.      Striped mildly erythematous mucosa without bleeding was found in the       gastric antrum. Biopsies were taken with a cold forceps for Helicobacter       pylori testing.      The gastric body and incisura were normal. Biopsies were taken with a        cold forceps for Helicobacter pylori testing.      The cardia and gastric fundus were normal on retroflexion.      Esophagogastric landmarks were identified: the gastroesophageal junction       was found at 38 cm from the incisors.      The gastroesophageal junction and examined esophagus were normal.       Biopsies were taken with a cold forceps for histology. Impression:            - Normal duodenal bulb and second portion of the                         duodenum.                        - Erythematous mucosa in the antrum. Biopsied.                        - Normal gastric body and incisura. Biopsied.                        - Esophagogastric landmarks identified.                        - Normal gastroesophageal junction and esophagus.                         Biopsied. Recommendation:        - Await pathology results.                        - Discharge patient to home (with escort).                        - Resume previous diet today.                        - Follow an antireflux regimen.                        - Continue present medications. Procedure Code(s):     --- Professional ---                        [phone], Esophagogastroduodenoscopy,  flexible,                         transoral; with biopsy, single or multiple Diagnosis Code(s):     --- Professional ---                        K31.89, Other diseases of stomach and duodenum                        R12, Heartburn                        K21.9, Gastro-esophageal reflux disease without                         esophagitis CPT copyright 2019 American Medical Association. All rights reserved. The codes documented in this report are preliminary and upon coder review may  be revised to meet current compliance requirements. Dr. Libby Maw Toney Reil MD, MD 02/21/2022 7:57:28 AM This report has been signed electronically. Number of Addenda: 0 Note Initiated On: 02/21/2022 7:00 AM Total Procedure Duration: 0 hours 6 minutes 49  seconds  Estimated Blood Loss:  Estimated blood loss: none.      [facility]

## 2022-02-21 NOTE — H&P (Signed)
Arlyss Repress, MD [address]  [address]  [place], [place] [address]  Main: [phone]  Fax: [phone] Pager: [phone]  Primary Care Physician:  [name], PA Primary Gastroenterologist:  Dr. Arlyss Repress  Pre-Procedure History & Physical: HPI:  Kendra [name] is a 31 y.o. female is here for an endoscopy.   Past Medical History:  Diagnosis Date   Anxiety    Depression    GERD (gastroesophageal reflux disease)    Headache    stress   Heart murmur    History of cannabis abuse    Seizures (HCC)    age 12. syncopal episode. Hit head. had seizure.    Tobacco abuse    Vitamin D deficiency     Past Surgical History:  Procedure Laterality Date   COLON SURGERY  2013   pt states "colonoscopy while awake"   COLONOSCOPY WITH PROPOFOL N/A 12/14/2015   Procedure: COLONOSCOPY WITH PROPOFOL;  Surgeon: Midge Minium, MD;  Location: [facility];  Service: Endoscopy;  Laterality: N/A;   ESOPHAGOGASTRODUODENOSCOPY (EGD) WITH PROPOFOL N/A 12/14/2015   Procedure: ESOPHAGOGASTRODUODENOSCOPY (EGD) WITH PROPOFOL;  Surgeon: Midge Minium, MD;  Location: [facility];  Service: Endoscopy;  Laterality: N/A;   LAPAROSCOPIC TUBAL LIGATION Bilateral 12/03/2021   Procedure: LAPAROSCOPIC TUBAL LIGATION;  Surgeon: Linzie Collin, MD;  Location: [facility];  Service: Gynecology;  Laterality: Bilateral;   WISDOM TOOTH EXTRACTION  2013    Prior to Admission medications   Medication Sig Start Date End Date Taking? Authorizing Provider  buprenorphine-naloxone (SUBOXONE) 8-2 mg SUBL SL tablet Place 1 tablet under the tongue daily. Patient uses 8mg  film bid   Yes [provider]  meloxicam (MOBIC) 15 MG tablet Take 15 mg by mouth daily as needed. 09/25/21  Yes [provider]  methocarbamol (ROBAXIN) 500 MG tablet Take by mouth. 09/14/21  Yes [provider]  pantoprazole (PROTONIX) 40 MG tablet Take 1 tablet by mouth daily. 01/26/22 02/25/22 Yes  [provider]  sertraline (ZOLOFT) 50 MG tablet Take 100 mg by mouth daily.   Yes [provider]    Allergies as of 02/06/2022   (No Known Allergies)    Family History  Problem Relation Age of Onset   Depression Mother    Diabetes Maternal Grandfather    Congestive Heart Failure Maternal Grandfather    Diverticulitis Maternal Grandmother    Dementia Maternal Grandmother     Social History   Socioeconomic History   Marital status: Married    Spouse name: Not on file   Number of children: Not on file   Years of education: Not on file   Highest education level: Not on file  Occupational History   Not on file  Tobacco Use   Smoking status: Every Day    Packs/day: 1.00    Years: 8.00    Total pack years: 8.00    Types: Cigarettes   Smokeless tobacco: Never  Vaping Use   Vaping Use: Never used  Substance and Sexual Activity   Alcohol use: No   Drug use: Not Currently    Types: Marijuana   Sexual activity: Yes    Birth control/protection: Surgical    Comment: BTL  Other Topics Concern   Not on file  Social History Narrative   Not on file   Social Determinants of Health   Financial Resource Strain: Low Risk  (12/11/2021)   Overall Financial Resource Strain (CARDIA)    Difficulty of Paying Living  Expenses: Not very hard  Food Insecurity: No Food Insecurity (10/11/2021)   Hunger Vital Sign    Worried About Running Out of Food in the Last Year: Never true    Ran Out of Food in the Last Year: Never true  Transportation Needs: No Transportation Needs (09/13/2021)   PRAPARE - Administrator, Civil Service (Medical): No    Lack of Transportation (Non-Medical): No  Physical Activity: Sufficiently Active (12/11/2021)   Exercise Vital Sign    Days of Exercise per Week: 5 days    Minutes of Exercise per Session: 60 min  Stress: No Stress Concern Present (11/07/2021)   [employer] of Occupational Health - Occupational Stress Questionnaire     Feeling of Stress : Only a little  Social Connections: Not on file  Intimate Partner Violence: Not At Risk (09/13/2021)   Humiliation, Afraid, Rape, and Kick questionnaire    Fear of Current or Ex-Partner: No    Emotionally Abused: No    Physically Abused: No    Sexually Abused: No    Review of Systems: See HPI, otherwise negative ROS  Physical Exam: BP 126/74   Pulse 87   Temp 97.6 F (36.4 C) (Temporal)   Ht 5\' 7"  (1.702 m)   Wt 102.5 kg   LMP 01/11/2022 (Approximate)   SpO2 100%   BMI 35.40 kg/m  General:   Alert,  pleasant and cooperative in NAD Head:  Normocephalic and atraumatic. Neck:  Supple; no masses or thyromegaly. Lungs:  Clear throughout to auscultation.    Heart:  Regular rate and rhythm. Abdomen:  Soft, nontender and nondistended. Normal bowel sounds, without guarding, and without rebound.   Neurologic:  Alert and  oriented x4;  grossly normal neurologically.  Impression/Plan: Kendra [name] is here for an endoscopy to be performed for chronic GERD  Risks, benefits, limitations, and alternatives regarding  endoscopy have been reviewed with the patient.  Questions have been answered.  All parties agreeable.   [name], MD  02/21/2022, 7:33 AM

## 2022-02-21 NOTE — Anesthesia Preprocedure Evaluation (Signed)
Anesthesia Evaluation  Patient identified by MRN, date of birth, ID band Patient awake    Reviewed: Allergy & Precautions, H&P , NPO status   Airway Mallampati: II  TM Distance: >3 FB Neck ROM: full    Dental   Pulmonary Current Smoker,    breath sounds clear to auscultation       Cardiovascular Normal cardiovascular exam+ Valvular Problems/Murmurs MVP      Neuro/Psych  Headaches, Seizures -,  PSYCHIATRIC DISORDERS    GI/Hepatic GERD  ,  Endo/Other    Renal/GU      Musculoskeletal   Abdominal   Peds  Hematology   Anesthesia Other Findings   Reproductive/Obstetrics                             Anesthesia Physical  Anesthesia Plan  ASA: II  Anesthesia Plan: General   Post-op Pain Management:    Induction: Intravenous  PONV Risk Score and Plan:   Airway Management Planned: Natural Airway  Additional Equipment:   Intra-op Plan:   Post-operative Plan:   Informed Consent: I have reviewed the patients History and Physical, chart, labs and discussed the procedure including the risks, benefits and alternatives for the proposed anesthesia with the patient or authorized representative who has indicated his/her understanding and acceptance.       Plan Discussed with: CRNA  Anesthesia Plan Comments:         Anesthesia Quick Evaluation

## 2022-02-21 NOTE — Transfer of Care (Signed)
Immediate Anesthesia Transfer of Care Note  Patient: KENEDEE MOLESKY  Procedure(s) Performed: ESOPHAGOGASTRODUODENOSCOPY (EGD) WITH BIOPSY (Mouth)  Patient Location: PACU  Anesthesia Type: No value filed.  Level of Consciousness: awake, alert  and patient cooperative  Airway and Oxygen Therapy: Patient Spontanous Breathing and Patient connected to supplemental oxygen  Post-op Assessment: Post-op Vital signs reviewed, Patient's Cardiovascular Status Stable, Respiratory Function Stable, Patent Airway and No signs of Nausea or vomiting  Post-op Vital Signs: Reviewed and stable  Complications: No notable events documented.

## 2022-02-21 NOTE — Anesthesia Postprocedure Evaluation (Signed)
Anesthesia Post Note  Patient: Kendra [name]  Procedure(s) Performed: ESOPHAGOGASTRODUODENOSCOPY (EGD) WITH BIOPSY (Mouth)     Patient location during evaluation: PACU Anesthesia Type: General Level of consciousness: awake and alert Pain management: pain level controlled Vital Signs Assessment: post-procedure vital signs reviewed and stable Respiratory status: spontaneous breathing, nonlabored ventilation, respiratory function stable and patient connected to nasal cannula oxygen Cardiovascular status: blood pressure returned to baseline and stable Postop Assessment: no apparent nausea or vomiting Anesthetic complications: no   No notable events documented.  Gayland Curry Ximenna Fonseca

## 2022-02-22 ENCOUNTER — Encounter: Payer: Self-pay | Admitting: Obstetrics and Gynecology

## 2022-02-22 ENCOUNTER — Encounter: Payer: Self-pay | Admitting: Gastroenterology

## 2022-02-26 ENCOUNTER — Emergency Department
Admission: EM | Admit: 2022-02-26 | Discharge: 2022-02-26 | Disposition: A | Discharge status: Home / Self Care | Payer: Medicaid Other | Attending: Emergency Medicine | Admitting: Emergency Medicine

## 2022-02-26 ENCOUNTER — Emergency Department: Payer: Medicaid Other

## 2022-02-26 ENCOUNTER — Encounter: Payer: Self-pay | Admitting: Emergency Medicine

## 2022-02-26 ENCOUNTER — Other Ambulatory Visit: Payer: Self-pay

## 2022-02-26 DIAGNOSIS — R002 Palpitations: Secondary | ICD-10-CM | POA: Insufficient documentation

## 2022-02-26 DIAGNOSIS — R0789 Other chest pain: Secondary | ICD-10-CM | POA: Insufficient documentation

## 2022-02-26 DIAGNOSIS — R5383 Other fatigue: Secondary | ICD-10-CM | POA: Insufficient documentation

## 2022-02-26 DIAGNOSIS — G8929 Other chronic pain: Secondary | ICD-10-CM

## 2022-02-26 LAB — BASIC METABOLIC PANEL
Anion gap: 8 (ref 5–15)
BUN: 9 mg/dL (ref 6–20)
CO2: 28 mmol/L (ref 22–32)
Calcium: 9.6 mg/dL (ref 8.9–10.3)
Chloride: 104 mmol/L (ref 98–111)
Creatinine, Ser: 0.8 mg/dL (ref 0.44–1.00)
GFR, Estimated: >60 mL/min (ref >60)
Glucose, Bld: 94 mg/dL (ref 70–99)
Potassium: 4 mmol/L (ref 3.5–5.1)
Sodium: 140 mmol/L (ref 135–145)

## 2022-02-26 LAB — URINALYSIS, ROUTINE W REFLEX MICROSCOPIC
Bilirubin Urine: NEGATIVE
Glucose, UA: NEGATIVE mg/dL
Hgb urine dipstick: NEGATIVE
Ketones, ur: NEGATIVE mg/dL
Leukocytes,Ua: NEGATIVE
Nitrite: NEGATIVE
Protein, ur: NEGATIVE mg/dL
Specific Gravity, Urine: 1.02 (ref 1.005–1.030)
pH: 8 (ref 5.0–8.0)

## 2022-02-26 LAB — CBC
HCT: 42.8 % (ref 36.0–46.0)
Hemoglobin: 14.1 g/dL (ref 12.0–15.0)
MCH: 32.9 pg (ref 26.0–34.0)
MCHC: 32.9 g/dL (ref 30.0–36.0)
MCV: 100 fL (ref 80.0–100.0)
Platelets: 259 10*3/uL (ref 150–400)
RBC: 4.28 MIL/uL (ref 3.87–5.11)
RDW: 12 % (ref 11.5–15.5)
WBC: 5.6 10*3/uL (ref 4.0–10.5)
nRBC: 0 % (ref 0.0–0.2)

## 2022-02-26 LAB — SURGICAL PATHOLOGY

## 2022-02-26 LAB — TROPONIN I (HIGH SENSITIVITY): Troponin I (High Sensitivity): <2 ng/L (ref <18)

## 2022-02-26 LAB — POC URINE PREG, ED: Preg Test, Ur: NEGATIVE

## 2022-02-26 MED ORDER — ALBENDAZOLE 200 MG PO TABS
400.0000 mg | ORAL_TABLET | Freq: Every day | ORAL | 0 refills | Status: AC
Start: 2022-02-26 — End: 2022-03-01

## 2022-02-26 NOTE — ED Provider Notes (Signed)
[facility] Provider Note    Event Date/Time   First MD Initiated Contact with Patient 02/26/22 1724     (approximate)   History   Chief Complaint Weakness and Chest Pain   HPI  Kendra [name] is a 31 y.o. female with past medical history of anxiety, cyclic vomiting syndrome, GERD, and chronic chest pain who presents to the ED complaining of chest pain.  Patient reports that she has been dealing with pain in the left side of her chest intermittently for the past couple of months.  She describes intermittent palpitations, during which her pain seems to be more severe.  Pain does not seem to be exacerbated or alleviated by thing in particular.  She denies any associated fevers, cough, shortness of breath, leg swelling or pain.  She has been seen in the ED for this problem on multiple occasions with unremarkable work-ups.  She states she has also been seen by cardiology and neurology, was told that she may have thoracic outlet syndrome.  She was recently referred to specialist at [facility] for further evaluation for possible thoracic outlet syndrome.  She states she became more concerned today because she has been feeling fatigued for the past 2 to 3 days, which is new for her.     Physical Exam   Triage Vital Signs: ED Triage Vitals  Enc Vitals Group     BP 02/26/22 1423 122/64     Pulse Rate 02/26/22 1423 91     Resp 02/26/22 1423 18     Temp 02/26/22 1423 98.3 F (36.8 C)     Temp Source 02/26/22 1423 Oral     SpO2 02/26/22 1423 100 %     Weight 02/26/22 1432 226 lb (102.5 kg)     Height 02/26/22 1432 5\' 7"  (1.702 m)     Head Circumference --      Peak Flow --      Pain Score 02/26/22 1432 8     Pain Loc --      Pain Edu? --      Excl. in GC? --     Most recent vital signs: Vitals:   02/26/22 1423  BP: 122/64  Pulse: 91  Resp: 18  Temp: 98.3 F (36.8 C)  SpO2: 100%    Constitutional: Alert and oriented. Eyes: Conjunctivae are  normal. Head: Atraumatic. Nose: No congestion/rhinnorhea. Mouth/Throat: Mucous membranes are moist.  Cardiovascular: Normal rate, regular rhythm. Grossly normal heart sounds.  2+ radial pulses bilaterally. Respiratory: Normal respiratory effort.  No retractions. Lungs CTAB.  Left chest wall tenderness to palpation noted. Gastrointestinal: Soft and nontender. No distention. Musculoskeletal: No lower extremity tenderness nor edema.  Neurologic:  Normal speech and language. No gross focal neurologic deficits are appreciated.    ED Results / Procedures / Treatments   Labs (all labs ordered are listed, but only abnormal results are displayed) Labs Reviewed  URINALYSIS, ROUTINE W REFLEX MICROSCOPIC - Abnormal; Notable for the following components:      Result Value   Color, Urine YELLOW (*)    APPearance CLOUDY (*)    All other components within normal limits  BASIC METABOLIC PANEL  CBC  POC URINE PREG, ED  POC URINE PREG, ED  TROPONIN I (HIGH SENSITIVITY)     EKG  ED ECG REPORT I, 04/28/22, the attending physician, personally viewed and interpreted this ECG.   Date: 02/26/2022  EKG Time: 14:26  Rate: 87  Rhythm: normal sinus rhythm, sinus  arrhythmia  Axis: Normal  Intervals:none  ST&T Change: None  RADIOLOGY Chest x-ray reviewed and interpreted by me with no infiltrate, edema, or effusion.  PROCEDURES:  Critical Care performed: No  Procedures   MEDICATIONS ORDERED IN ED: Medications - No data to display   IMPRESSION / MDM / ASSESSMENT AND PLAN / ED COURSE  I reviewed the triage vital signs and the nursing notes.                              31 y.o. female with past medical history of anxiety, cyclic vomiting syndrome, GERD, and chronic chest pain who presents to the ED complaining of acute on chronic chest pain for the past couple of days along with increased fatigue.  Patient's presentation is most consistent with acute presentation with potential  threat to life or bodily function.  Differential diagnosis includes, but is not limited to, ACS, PE, pneumonia, pneumothorax, dissection, musculoskeletal pain, anxiety, GERD, thoracic outlet syndrome.  Patient well-appearing and in no acute distress, vital signs are unremarkable.  EKG shows no evidence of arrhythmia or ischemia, chest pain is chronic and atypical, reproducible with palpation on exam.  Troponin is within normal limits and I doubt ACS, no findings to suggest PE or dissection.  Additional labs are reassuring with no significant anemia, leukocytosis, electrolyte abnormality, or AKI.  Chest x-ray is also unremarkable, pregnancy testing is negative and urinalysis shows no signs of infection.  Patient is appropriate for discharge home with neurology follow-up as previously scheduled as well as follow-up at [facility] with specialist regarding thoracic outlet syndrome.  She was counseled to return to the ED for new worsening symptoms, patient agrees with plan.      FINAL CLINICAL IMPRESSION(S) / ED DIAGNOSES   Final diagnoses:  Chronic chest pain     Rx / DC Orders   ED Discharge Orders     None        Note:  This document was prepared using Dragon voice recognition software and may include unintentional dictation errors.   Chesley Noon, MD 02/26/22 [phone]

## 2022-02-26 NOTE — ED Provider Triage Note (Signed)
Emergency Medicine Provider Triage Evaluation Note  Kendra [name] , a 31 y.o. female  was evaluated in triage.  Pt complains of weakness, abdominal pain and Chest pain. Chest pain episodes are intermittent.  No prior hx of heart problems   Review of Systems  Positive: +SOB, + chills  + smoker Negative: - fever   Physical Exam  BP 122/64 (BP Location: Left Arm)   Pulse 91   Temp 98.3 F (36.8 C) (Oral)   Resp 18   Ht 5\' 7"  (1.702 m)   Wt 102.5 kg   LMP 02/08/2022 (Approximate)   SpO2 100%   BMI 35.40 kg/m  Gen:   Awake, no distress   Resp:  Normal effort.  Lungs clear  MSK:   Moves extremities without difficulty  Other:  Left anterior chest wall pain with palpitations.    Medical Decision Making  Medically screening exam initiated at 2:33 PM.  Appropriate orders placed.  MADDIE BRAZIER was informed that the remainder of the evaluation will be completed by another provider, this initial triage assessment does not replace that evaluation, and the importance of remaining in the ED until their evaluation is complete.     Kendra Coil, PA-C 02/26/22 1438

## 2022-02-26 NOTE — ED Triage Notes (Signed)
Patient c/o left sided chest pain into left shoulder and weakness since last night around midnight. States started as heart racing that resolved but had another episode this morning. Patient also states her stomach is hurting.

## 2022-02-27 ENCOUNTER — Telehealth: Payer: Self-pay

## 2022-02-27 NOTE — Patient Outreach (Signed)
Care Coordination  02/27/2022  Kendra [name] 03-13-1991 [id number]  Transition Care Management Unsuccessful Follow-up Telephone Call  Date of discharge and from where:  02/26/22 [facility]   Attempts:  1st Attempt  Reason for unsuccessful TCM follow-up call:  Voice mail full

## 2022-02-28 ENCOUNTER — Telehealth: Payer: Self-pay

## 2022-02-28 NOTE — Patient Outreach (Signed)
Care Coordination  02/28/2022  Kendra [name] Apr 17, 1991 [id number]  Transition Care Management Unsuccessful Follow-up Telephone Call  Date of discharge and from where:  02/26/22 [facility]  Attempts:  2nd Attempt  Reason for unsuccessful TCM follow-up call:  Voice mail full

## 2022-03-04 ENCOUNTER — Emergency Department
Admission: EM | Admit: 2022-03-04 | Discharge: 2022-03-04 | Disposition: A | Discharge status: Home / Self Care | Payer: Medicaid Other | Attending: Emergency Medicine | Admitting: Emergency Medicine

## 2022-03-04 ENCOUNTER — Other Ambulatory Visit: Payer: Self-pay

## 2022-03-04 ENCOUNTER — Encounter: Payer: Self-pay | Admitting: Emergency Medicine

## 2022-03-04 DIAGNOSIS — R079 Chest pain, unspecified: Secondary | ICD-10-CM | POA: Diagnosis not present

## 2022-03-04 DIAGNOSIS — R55 Syncope and collapse: Secondary | ICD-10-CM | POA: Insufficient documentation

## 2022-03-04 LAB — URINALYSIS, ROUTINE W REFLEX MICROSCOPIC
Bilirubin Urine: NEGATIVE
Glucose, UA: NEGATIVE mg/dL
Hgb urine dipstick: NEGATIVE
Ketones, ur: NEGATIVE mg/dL
Leukocytes,Ua: NEGATIVE
Nitrite: NEGATIVE
Protein, ur: NEGATIVE mg/dL
Specific Gravity, Urine: 1.029 (ref 1.005–1.030)
pH: 5 (ref 5.0–8.0)

## 2022-03-04 LAB — CBC WITH DIFFERENTIAL/PLATELET
Abs Immature Granulocytes: 0.01 10*3/uL (ref 0.00–0.07)
Basophils Absolute: 0 10*3/uL (ref 0.0–0.1)
Basophils Relative: 0 %
Eosinophils Absolute: 0.1 10*3/uL (ref 0.0–0.5)
Eosinophils Relative: 1 %
HCT: 40.5 % (ref 36.0–46.0)
Hemoglobin: 13.9 g/dL (ref 12.0–15.0)
Immature Granulocytes: 0 %
Lymphocytes Relative: 33 %
Lymphs Abs: 2.4 10*3/uL (ref 0.7–4.0)
MCH: 33.4 pg (ref 26.0–34.0)
MCHC: 34.3 g/dL (ref 30.0–36.0)
MCV: 97.4 fL (ref 80.0–100.0)
Monocytes Absolute: 0.7 10*3/uL (ref 0.1–1.0)
Monocytes Relative: 10 %
Neutro Abs: 3.9 10*3/uL (ref 1.7–7.7)
Neutrophils Relative %: 56 %
Platelets: 294 10*3/uL (ref 150–400)
RBC: 4.16 MIL/uL (ref 3.87–5.11)
RDW: 12.2 % (ref 11.5–15.5)
WBC: 7.1 10*3/uL (ref 4.0–10.5)
nRBC: 0 % (ref 0.0–0.2)

## 2022-03-04 LAB — TROPONIN I (HIGH SENSITIVITY)
Troponin I (High Sensitivity): 2 ng/L (ref <18)
Troponin I (High Sensitivity): <2 ng/L (ref <18)

## 2022-03-04 LAB — BASIC METABOLIC PANEL
Anion gap: 7 (ref 5–15)
BUN: 11 mg/dL (ref 6–20)
CO2: 26 mmol/L (ref 22–32)
Calcium: 9.4 mg/dL (ref 8.9–10.3)
Chloride: 107 mmol/L (ref 98–111)
Creatinine, Ser: 0.79 mg/dL (ref 0.44–1.00)
GFR, Estimated: >60 mL/min (ref >60)
Glucose, Bld: 89 mg/dL (ref 70–99)
Potassium: 4.1 mmol/L (ref 3.5–5.1)
Sodium: 140 mmol/L (ref 135–145)

## 2022-03-04 LAB — POC URINE PREG, ED: Preg Test, Ur: NEGATIVE

## 2022-03-04 NOTE — ED Triage Notes (Signed)
Patient arrives ambulatory by POV c/o dizzy spells, chest pain and had a syncopal episode earlier today. States she went to [facility] last week and had a CT scan done for the dizziness.

## 2022-03-04 NOTE — ED Provider Notes (Signed)
[facility] Emergency Department Provider Note     Event Date/Time   First MD Initiated Contact with Patient 03/04/22 1600     (approximate)   History   Loss of Consciousness and Chest Pain   HPI  Kendra [name] is a 31 y.o. female with a history of anxiety, depression, ADHD, and chest pain presents to the ED with complaints of dizzy spells and chest pain.  Patient reports onset of symptoms while at home.  She had a syncopal episode that was preceded by her sudden onset of chest pain.  She denies any head injury was only unconscious for a brief moment.  She presents to the ED for evaluation of her symptoms.  She reports last week she had a CT scan done at [facility] for her ongoing complaints of dizziness. She presents to the ED for concern over the left-sided chest pain she experienced with onset 2 hours prior to the syncopal episode. She reports the episode occurred at about 2 pm this afternoon.  She waited several hours before driving herself to the ED.    Physical Exam   Triage Vital Signs: ED Triage Vitals  Enc Vitals Group     BP 03/04/22 1523 113/82     Pulse Rate 03/04/22 1523 78     Resp 03/04/22 1523 18     Temp 03/04/22 1523 99 F (37.2 C)     Temp Source 03/04/22 1523 Oral     SpO2 03/04/22 1523 100 %     Weight 03/04/22 1522 226 lb (102.5 kg)     Height 03/04/22 1522 5\' 7"  (1.702 m)     Head Circumference --      Peak Flow --      Pain Score 03/04/22 1522 7     Pain Loc --      Pain Edu? --      Excl. in GC? --     Most recent vital signs: Vitals:   03/04/22 1523 03/04/22 1834  BP: 113/82 115/73  Pulse: 78 85  Resp: 18 17  Temp: 99 F (37.2 C)   SpO2: 100% 100%    General Awake, no distress. NAD HEENT NCAT. PERRL. EOMI. No rhinorrhea. Mucous membranes are moist.  CV:  Good peripheral perfusion.  RESP:  Normal effort.  ABD:  No distention.    ED Results / Procedures / Treatments   Labs (all labs ordered  are listed, but only abnormal results are displayed) Labs Reviewed  URINALYSIS, ROUTINE W REFLEX MICROSCOPIC - Abnormal; Notable for the following components:      Result Value   Color, Urine YELLOW (*)    APPearance CLOUDY (*)    All other components within normal limits  CBC WITH DIFFERENTIAL/PLATELET  BASIC METABOLIC PANEL  POC URINE PREG, ED  TROPONIN I (HIGH SENSITIVITY)  TROPONIN I (HIGH SENSITIVITY)     EKG  Vent. rate 89 BPM PR interval 156 ms QRS duration 92 ms QT/QTcB 366/445 ms P-R-T axes 69 65 33 NSR  No STEMI  RADIOLOGY   No results found.   PROCEDURES:  Critical Care performed: No  Procedures   MEDICATIONS ORDERED IN ED: Medications - No data to display   IMPRESSION / MDM / ASSESSMENT AND PLAN / ED COURSE  I reviewed the triage vital signs and the nursing notes.  Differential diagnosis includes, but is not limited to, alcohol, illicit or prescription medications, or other toxic ingestion; intracranial pathology such as stroke or intracerebral hemorrhage; fever or infectious causes including sepsis; hypoxemia and/or hypercarbia; uremia; trauma; endocrine related disorders such as diabetes, hypoglycemia, and thyroid-related diseases; hypertensive encephalopathy; etc.   Patient's presentation is most consistent with acute complicated illness / injury requiring diagnostic workup.  Patient's diagnosis is consistent with chronic chest pain without evidence of acute ACS.  Patient with overall reassuring workup for her reports of syncope and persistent chest pain.  No lab abnormalities noted.  Troponin negative x 2, EKG without malignant arrhythmia or STEMI.  Patient is stable throughout her course in the ED.   Patient is to follow up with her primary provider or cardiologist as needed or otherwise directed. Patient is given ED precautions to return to the ED for any worsening or new symptoms.     FINAL CLINICAL IMPRESSION(S) /  ED DIAGNOSES   Final diagnoses:  Syncope and collapse  Nonspecific chest pain     Rx / DC Orders   ED Discharge Orders     None        Note:  This document was prepared using Dragon voice recognition software and may include unintentional dictation errors.    Lissa Hoard, PA-C 03/04/22 1947    Sharyn Creamer, MD 03/05/22 [phone]

## 2022-03-04 NOTE — ED Provider Triage Note (Signed)
  Emergency Medicine Provider Triage Evaluation Note  Kendra [name] , a 31 y.o.female,  was evaluated in triage.  Pt complains of chest pain loss of consciousness.  Patient states that she was in the kitchen when she had a sudden onset of chest pain which caused her to lose consciousness.  Denies hitting her head, but did hit her right leg on something.  She states that is only for a brief period.  Denies any active symptoms at this time.   Review of Systems  Positive: Chest pain, LOC Negative: Denies fever, abdominal pain, vomiting  Physical Exam   Vitals:   03/04/22 1523  BP: 113/82  Pulse: 78  Resp: 18  Temp: 99 F (37.2 C)  SpO2: 100%   Gen:   Awake, no distress   Resp:  Normal effort  MSK:   Moves extremities without difficulty  Other:    Medical Decision Making  Given the patient's initial medical screening exam, the following diagnostic evaluation has been ordered. The patient will be placed in the appropriate treatment space, once one is available, to complete the evaluation and treatment. I have discussed the plan of care with the patient and I have advised the patient that an ED physician or mid-level practitioner will reevaluate their condition after the test results have been received, as the results may give them additional insight into the type of treatment they may need.    Diagnostics: Labs, EKG  Treatments: none immediately   Varney Daily, [place] 03/04/22 1545

## 2022-03-04 NOTE — ED Notes (Signed)
Lab called for blood redraw

## 2022-03-04 NOTE — Discharge Instructions (Addendum)
Your exam, labs, and EKG were all normal return + radial signs of any serious underlying cause for your ongoing chest pain.  Follow-up with primary provider and cardiology for ongoing symptoms.  Return to ED if needed.

## 2022-03-05 ENCOUNTER — Encounter: Payer: Self-pay | Admitting: Emergency Medicine

## 2022-03-05 ENCOUNTER — Emergency Department: Payer: Medicaid Other

## 2022-03-05 DIAGNOSIS — R55 Syncope and collapse: Secondary | ICD-10-CM | POA: Insufficient documentation

## 2022-03-05 DIAGNOSIS — R42 Dizziness and giddiness: Secondary | ICD-10-CM | POA: Insufficient documentation

## 2022-03-05 DIAGNOSIS — Z20822 Contact with and (suspected) exposure to covid-19: Secondary | ICD-10-CM | POA: Diagnosis not present

## 2022-03-05 DIAGNOSIS — R079 Chest pain, unspecified: Secondary | ICD-10-CM | POA: Insufficient documentation

## 2022-03-05 DIAGNOSIS — F17200 Nicotine dependence, unspecified, uncomplicated: Secondary | ICD-10-CM | POA: Diagnosis not present

## 2022-03-05 LAB — CBC
HCT: 41 % (ref 36.0–46.0)
Hemoglobin: 13.7 g/dL (ref 12.0–15.0)
MCH: 33.2 pg (ref 26.0–34.0)
MCHC: 33.4 g/dL (ref 30.0–36.0)
MCV: 99.3 fL (ref 80.0–100.0)
Platelets: 294 10*3/uL (ref 150–400)
RBC: 4.13 MIL/uL (ref 3.87–5.11)
RDW: 11.9 % (ref 11.5–15.5)
WBC: 7.5 10*3/uL (ref 4.0–10.5)
nRBC: 0 % (ref 0.0–0.2)

## 2022-03-05 LAB — POC URINE PREG, ED: Preg Test, Ur: NEGATIVE

## 2022-03-05 LAB — BASIC METABOLIC PANEL
Anion gap: 8 (ref 5–15)
BUN: 13 mg/dL (ref 6–20)
CO2: 25 mmol/L (ref 22–32)
Calcium: 9.4 mg/dL (ref 8.9–10.3)
Chloride: 105 mmol/L (ref 98–111)
Creatinine, Ser: 0.87 mg/dL (ref 0.44–1.00)
GFR, Estimated: >60 mL/min (ref >60)
Glucose, Bld: 101 mg/dL — ABNORMAL HIGH (ref 70–99)
Potassium: 3.7 mmol/L (ref 3.5–5.1)
Sodium: 138 mmol/L (ref 135–145)

## 2022-03-05 LAB — TROPONIN I (HIGH SENSITIVITY): Troponin I (High Sensitivity): 2 ng/L (ref <18)

## 2022-03-05 NOTE — ED Triage Notes (Signed)
Pt presents via POV with complaints of CP and dizziness that started roughly a week ago and has progressively gotten worse. Pt states she was seen here yesterday for same. Denies SOB.

## 2022-03-06 ENCOUNTER — Emergency Department
Admission: EM | Admit: 2022-03-06 | Discharge: 2022-03-06 | Disposition: A | Discharge status: Home / Self Care | Payer: Medicaid Other | Attending: Emergency Medicine | Admitting: Emergency Medicine

## 2022-03-06 DIAGNOSIS — R42 Dizziness and giddiness: Secondary | ICD-10-CM

## 2022-03-06 LAB — TROPONIN I (HIGH SENSITIVITY): Troponin I (High Sensitivity): <2 ng/L (ref <18)

## 2022-03-06 LAB — RESP PANEL BY RT-PCR (FLU A&B, COVID) ARPGX2
Influenza A by PCR: NEGATIVE
Influenza B by PCR: NEGATIVE
SARS Coronavirus 2 by RT PCR: NEGATIVE

## 2022-03-06 LAB — D-DIMER, QUANTITATIVE: D-Dimer, Quant: 0.4 ug/mL-FEU (ref 0.00–0.50)

## 2022-03-06 MED ORDER — MECLIZINE HCL 25 MG PO TABS: 25.0000 mg | ORAL_TABLET | Freq: Three times a day (TID) | ORAL | 0 refills | Status: DC | PRN

## 2022-03-06 MED ORDER — MECLIZINE HCL 25 MG PO TABS
25.0000 mg | ORAL_TABLET | Freq: Once | ORAL | Status: AC
  Administered 2022-03-06: 25 mg via ORAL
  Filled 2022-03-06: qty 1

## 2022-03-06 NOTE — ED Provider Notes (Signed)
[facility] Provider Note    Event Date/Time   First MD Initiated Contact with Patient 03/06/22 0114     (approximate)   History   Chest Pain   HPI  Kendra [name] is a 31 y.o. female with pmh depression, GERD, chronic chest pain presents with dizziness.  Patient notes for the last week she has had intermittent feeling of room spinning sensation.  Is worse when she moves around or moves her head.  Denies double vision focal numbness tingling weakness.  She has had ongoing chest pain but this is chronic for her is associated with shortness of breath.  She did pass out yesterday seen for this in the emergency department.  She also notes she has had decreased appetite and feeling generally more fatigued and weak over the last week as well denies fevers does have chronic nasal congestion.  No cough.     Past Medical History:  Diagnosis Date   Anxiety    Depression    GERD (gastroesophageal reflux disease)    Headache    stress   Heart murmur    History of cannabis abuse    Seizures (HCC)    age 52. syncopal episode. Hit head. had seizure.    Tobacco abuse    Vitamin D deficiency     Patient Active Problem List   Diagnosis Date Noted   Chronic GERD    Gastric erythema    Atypical chest pain 02/05/2022   Frequent patient in emergency department 01/22/2022   Seizure-like activity (HCC) 10/11/2021   Noninfectious diarrhea    Non-intractable cyclical vomiting with nausea    Diarrhea    Anxiety and depression 01/19/2015   History of cannabis dependence/abuse (HCC) 01/19/2015   Tobacco abuse 01/19/2015   Allergic rhinitis 08/04/2007   Attention deficit disorder 04/18/2007     Physical Exam  Triage Vital Signs: ED Triage Vitals  Enc Vitals Group     BP 03/05/22 2146 (!) 133/90     Pulse Rate 03/05/22 2146 85     Resp 03/05/22 2146 18     Temp 03/05/22 2146 98.9 F (37.2 C)     Temp Source 03/05/22 2146 Oral     SpO2 03/05/22 2146 98 %      Weight 03/05/22 2206 220 lb (99.8 kg)     Height 03/05/22 2206 5\' 7"  (1.702 m)     Head Circumference --      Peak Flow --      Pain Score 03/05/22 2206 8     Pain Loc --      Pain Edu? --      Excl. in GC? --     Most recent vital signs: Vitals:   03/05/22 2146  BP: (!) 133/90  Pulse: 85  Resp: 18  Temp: 98.9 F (37.2 C)  SpO2: 98%     General: Awake, no distress.  CV:  Good peripheral perfusion.  Resp:  Normal effort.  Abd:  No distention.  Neuro:             Awake, Alert, Oriented x 3  Other:  Aox3, nml speech  PERRL, EOMI, face symmetric, nml tongue movement  5/5 strength in the BL upper and lower extremities  Sensation grossly intact in the BL upper and lower extremities  Finger-nose-finger intact BL    ED Results / Procedures / Treatments  Labs (all labs ordered are listed, but only abnormal results are displayed) Labs Reviewed  BASIC METABOLIC  PANEL - Abnormal; Notable for the following components:      Result Value   Glucose, Bld 101 (*)    All other components within normal limits  CBC  D-DIMER, QUANTITATIVE  POC URINE PREG, ED  POC URINE PREG, ED  TROPONIN I (HIGH SENSITIVITY)  TROPONIN I (HIGH SENSITIVITY)     EKG  EKG interpretation performed by myself: NSR, nml axis, nml intervals, no acute ischemic changes    RADIOLOGY    PROCEDURES:  Critical Care performed: No  Procedures     MEDICATIONS ORDERED IN ED: Medications  meclizine (ANTIVERT) tablet 25 mg (25 mg Oral Given 03/06/22 0241)     IMPRESSION / MDM / ASSESSMENT AND PLAN / ED COURSE  I reviewed the triage vital signs and the nursing notes.                              Patient's presentation is most consistent with acute complicated illness / injury requiring diagnostic workup.  Differential diagnosis includes, but is not limited to, peripheral vertigo, labyrinthitis, viral illness, syncope, presyncope, hypovolemic dehydration, AKI, less likely ACS pulmonary  embolism  Patient is a 31 year old female who is well-known to this ED for multiple visits for chest pain and syncope presents today with dizziness.  She describes spinning sensation has been going on for the last week worse with movement and standing up.  Denies any associated focal neurologic symptoms.  She was seen in yesterday in the emergency department for syncope.  She has ongoing chest pain that is chronic issue for her.  Patient's vitals are unremarkable she looks well she has a nonfocal neurologic exam.  D-dimer and troponins are negative EKG is nonischemic the rest of her labs are reassuring pregnancy test is negative.  Her symptomatology sounds seem more like vertigo as opposed to syncope given the primary spinning component.  Neurologic exam is not concerning for central vertigo.  She may have an underlying viral illness given the loss of appetite and fatigue which could be causing this.  We will try meclizine.  She is appropriate for discharge.       FINAL CLINICAL IMPRESSION(S) / ED DIAGNOSES   Final diagnoses:  Vertigo     Rx / DC Orders   ED Discharge Orders     None        Note:  This document was prepared using Dragon voice recognition software and may include unintentional dictation errors.   [name], MD 03/06/22 [phone]

## 2022-03-06 NOTE — Discharge Instructions (Addendum)
I suspect that you have vertigo which is from an inner ear problem.  This could be triggered by a viral illness.  You can try the meclizine and take it if it helps with your symptoms.

## 2022-03-08 ENCOUNTER — Telehealth: Payer: Self-pay

## 2022-03-08 NOTE — Patient Outreach (Signed)
Care Coordination  03/08/2022  Kendra [name] 20-Dec-1990 [id number]  Transition Care Management Unsuccessful Follow-up Telephone Call  Date of discharge and from where:  03/07/22 [facility]   Attempts:  1st Attempt  Reason for unsuccessful TCM follow-up call:  Unable to leave message

## 2022-03-11 ENCOUNTER — Telehealth: Payer: Self-pay | Admitting: *Deleted

## 2022-03-11 NOTE — Patient Outreach (Signed)
Care Coordination  03/11/2022  Kendra [name] 1990-10-17 [id number]  Transition Care Management Unsuccessful Follow-up Telephone Call  Date of discharge and from where:  03/09/22 from [facility] ED and 03/10/22 from [facility] ED  Attempts:  1st Attempt  Reason for unsuccessful TCM follow-up call:  Voice mail full  [name] RN, BSN [place]  [facility] Care Coordinator

## 2022-03-12 ENCOUNTER — Telehealth: Payer: Self-pay | Admitting: *Deleted

## 2022-03-12 ENCOUNTER — Emergency Department
Admission: EM | Admit: 2022-03-12 | Discharge: 2022-03-12 | Disposition: A | Discharge status: Home / Self Care | Payer: Medicaid Other | Attending: Emergency Medicine | Admitting: Emergency Medicine

## 2022-03-12 ENCOUNTER — Other Ambulatory Visit: Payer: Self-pay

## 2022-03-12 ENCOUNTER — Emergency Department: Payer: Medicaid Other

## 2022-03-12 ENCOUNTER — Encounter: Payer: Self-pay | Admitting: Emergency Medicine

## 2022-03-12 DIAGNOSIS — F17210 Nicotine dependence, cigarettes, uncomplicated: Secondary | ICD-10-CM | POA: Diagnosis not present

## 2022-03-12 DIAGNOSIS — R079 Chest pain, unspecified: Secondary | ICD-10-CM

## 2022-03-12 DIAGNOSIS — R0789 Other chest pain: Secondary | ICD-10-CM | POA: Diagnosis not present

## 2022-03-12 DIAGNOSIS — R791 Abnormal coagulation profile: Secondary | ICD-10-CM | POA: Insufficient documentation

## 2022-03-12 LAB — CBC
HCT: 41.6 % (ref 36.0–46.0)
Hemoglobin: 13.9 g/dL (ref 12.0–15.0)
MCH: 33.1 pg (ref 26.0–34.0)
MCHC: 33.4 g/dL (ref 30.0–36.0)
MCV: 99 fL (ref 80.0–100.0)
Platelets: 322 10*3/uL (ref 150–400)
RBC: 4.2 MIL/uL (ref 3.87–5.11)
RDW: 11.8 % (ref 11.5–15.5)
WBC: 7.3 10*3/uL (ref 4.0–10.5)
nRBC: 0 % (ref 0.0–0.2)

## 2022-03-12 LAB — BASIC METABOLIC PANEL
Anion gap: 6 (ref 5–15)
BUN: 13 mg/dL (ref 6–20)
CO2: 27 mmol/L (ref 22–32)
Calcium: 9.4 mg/dL (ref 8.9–10.3)
Chloride: 105 mmol/L (ref 98–111)
Creatinine, Ser: 0.83 mg/dL (ref 0.44–1.00)
GFR, Estimated: >60 mL/min (ref >60)
Glucose, Bld: 88 mg/dL (ref 70–99)
Potassium: 4.3 mmol/L (ref 3.5–5.1)
Sodium: 138 mmol/L (ref 135–145)

## 2022-03-12 LAB — D-DIMER, QUANTITATIVE: D-Dimer, Quant: 0.53 ug/mL-FEU — ABNORMAL HIGH (ref 0.00–0.50)

## 2022-03-12 LAB — TROPONIN I (HIGH SENSITIVITY)
Troponin I (High Sensitivity): 3 ng/L (ref <18)
Troponin I (High Sensitivity): 3 ng/L (ref <18)

## 2022-03-12 LAB — POC URINE PREG, ED: Preg Test, Ur: NEGATIVE

## 2022-03-12 MED ORDER — IOHEXOL 350 MG/ML SOLN
75.0000 mL | Freq: Once | INTRAVENOUS | Status: AC | PRN
  Administered 2022-03-12: 75 mL via INTRAVENOUS

## 2022-03-12 NOTE — ED Triage Notes (Signed)
Patient to ED for chest pain "for months." Patient states that today she felt like her heart was going to beat out of her chest. Patient states pain on left side and radiates into back and shoulder.

## 2022-03-12 NOTE — ED Provider Notes (Signed)
[facility] Provider Note    Event Date/Time   First MD Initiated Contact with Patient 03/12/22 1701     (approximate)   History   Chest Pain   HPI  Kendra [name] is a 31 y.o. female with multiple visits to the ED for chest pain or atypical chest pain presents emergency department stating that she had chest pain while driving.  Got out of the car and flagged down a state trooper so that they can call EMS to bring her to the emergency department.  States that pain made her go pale and white.  Denies any recent travel, patient has cut back on her smoking to 3 cigarettes/day.  No birth control pills      Physical Exam   Triage Vital Signs: ED Triage Vitals  Enc Vitals Group     BP 03/12/22 1541 (!) 143/100     Pulse Rate 03/12/22 1541 80     Resp 03/12/22 1541 18     Temp 03/12/22 1541 98.3 F (36.8 C)     Temp Source 03/12/22 1541 Oral     SpO2 03/12/22 1541 100 %     Weight 03/12/22 1542 220 lb (99.8 kg)     Height 03/12/22 1542 5\' 7"  (1.702 m)     Head Circumference --      Peak Flow --      Pain Score 03/12/22 1542 8     Pain Loc --      Pain Edu? --      Excl. in GC? --     Most recent vital signs: Vitals:   03/12/22 1541 03/12/22 1829  BP: (!) 143/100 (!) 138/90  Pulse: 80 78  Resp: 18 18  Temp: 98.3 F (36.8 C)   SpO2: 100% 100%     General: Awake, no distress.   CV:  Good peripheral perfusion. regular rate and  rhythm Resp:  Normal effort. Lungs CTA Abd:  No distention.   Other:      ED Results / Procedures / Treatments   Labs (all labs ordered are listed, but only abnormal results are displayed) Labs Reviewed  D-DIMER, QUANTITATIVE - Abnormal; Notable for the following components:      Result Value   D-Dimer, Quant 0.53 (*)    All other components within normal limits  BASIC METABOLIC PANEL  CBC  POC URINE PREG, ED  TROPONIN I (HIGH SENSITIVITY)  TROPONIN I (HIGH SENSITIVITY)      EKG  EKG   RADIOLOGY Chest x-ray    PROCEDURES:   Procedures   MEDICATIONS ORDERED IN ED: Medications  iohexol (OMNIPAQUE) 350 MG/ML injection 75 mL (75 mLs Intravenous Contrast Given 03/12/22 1940)     IMPRESSION / MDM / ASSESSMENT AND PLAN / ED COURSE  I reviewed the triage vital signs and the nursing notes.                              Differential diagnosis includes, but is not limited to, atypical chest pain, palpitations, anxiety, panic attack  Patient's presentation is most consistent with acute complicated illness / injury requiring diagnostic workup.   Patient's initial labs are reassuring, CBC, metabolic panel and troponin are all normal.  We will order D-dimer and second troponin.  If D-dimer is elevated we will of course do CTA for PE.  Do feel this is less likely as a feel patient more than likely had  a panic attack.  He appears to be well at this time.  EKG showed normal sinus rhythm, see physician read  Clinical Course as of 03/12/22 1958  Tue Mar 12, 2022  1951 Assumed care from S. Nashaly Dorantes, PA-C. Awaiting CTA chest.  [JP]    Clinical Course User Index [JP] Poggi, Herb Grays, PA-C  Patient refused her chest x-ray  D-dimer is elevated so will order CTA for PE.  Patient is agreeable to the CT.  Care transferred to Alvy Beal, PA-C   FINAL CLINICAL IMPRESSION(S) / ED DIAGNOSES   Final diagnoses:  Atypical chest pain  Chest pain, unspecified type     Rx / DC Orders   ED Discharge Orders     None        Note:  This document was prepared using Dragon voice recognition software and may include unintentional dictation errors.    Faythe Ghee, PA-C 03/12/22 1959    Concha Se, MD 03/13/22 1343

## 2022-03-12 NOTE — ED Notes (Signed)
Patient discharged at this time. Ambulated to lobby with independent and steady gait. Breathing unlabored speaking in full sentences. Verbalized understanding of all discharge, follow up, and medication teaching. Discharged homed with all belongings.   

## 2022-03-12 NOTE — Discharge Instructions (Signed)
Your CT scan was normal.  Please follow-up with your cardiologist as you have scheduled.  Please return for any new, worsening, or change in symptoms or other concerns.  It was a pleasure caring for you today.

## 2022-03-12 NOTE — ED Provider Triage Note (Signed)
  Emergency Medicine Provider Triage Evaluation Note  Kendra [name] , a 31 y.o.female,  was evaluated in triage.  Pt complains of left-sided chest pain/palpitations that started today while she was on the road.  She states she had to pull over.  No active symptoms at this time.   Review of Systems  Positive: Chest pain Negative: Denies fever, chest pain, vomiting  Physical Exam   Vitals:   03/12/22 1541  BP: (!) 143/100  Pulse: 80  Resp: 18  Temp: 98.3 F (36.8 C)  SpO2: 100%   Gen:   Awake, no distress   Resp:  Normal effort  MSK:   Moves extremities without difficulty  Other:    Medical Decision Making  Given the patient's initial medical screening exam, the following diagnostic evaluation has been ordered. The patient will be placed in the appropriate treatment space, once one is available, to complete the evaluation and treatment. I have discussed the plan of care with the patient and I have advised the patient that an ED physician or mid-level practitioner will reevaluate their condition after the test results have been received, as the results may give them additional insight into the type of treatment they may need.    Diagnostics: Labs, EKG  Treatments: none immediately   Varney Daily, [place] 03/12/22 1557

## 2022-03-12 NOTE — Progress Notes (Deleted)
BH MD/PA/NP OP Progress Note  03/12/2022 10:44 AM Kendra [name]  MRN:  [id number]  Chief Complaint: No chief complaint on file.  HPI:  -Since the last visit, she was seen at [facility] for possible thoracic outlet syndrome. The patient did not have any significant findings consistent with this diagnosis.  She is referred to be seen by orthopedics.  -Since the last visit, she visited ED several times for spinning sensation and chest pain.    Visit Diagnosis: No diagnosis found.  Past Psychiatric History: Please see initial evaluation for full details. I have reviewed the history. No updates at this time.     Past Medical History:  Past Medical History:  Diagnosis Date   Anxiety    Depression    GERD (gastroesophageal reflux disease)    Headache    stress   Heart murmur    History of cannabis abuse    Seizures ([place])    age 31. syncopal episode. Hit head. had seizure.    Tobacco abuse    Vitamin D deficiency     Past Surgical History:  Procedure Laterality Date   COLON SURGERY  2013   pt states "colonoscopy while awake"   COLONOSCOPY WITH PROPOFOL N/A 12/14/2015   Procedure: COLONOSCOPY WITH PROPOFOL;  Surgeon: Lucilla Lame, MD;  Location: [place];  Service: Endoscopy;  Laterality: N/A;   ESOPHAGOGASTRODUODENOSCOPY (EGD) WITH PROPOFOL N/A 12/14/2015   Procedure: ESOPHAGOGASTRODUODENOSCOPY (EGD) WITH PROPOFOL;  Surgeon: Lucilla Lame, MD;  Location: [place];  Service: Endoscopy;  Laterality: N/A;   ESOPHAGOGASTRODUODENOSCOPY (EGD) WITH PROPOFOL N/A 02/21/2022   Procedure: ESOPHAGOGASTRODUODENOSCOPY (EGD) WITH BIOPSY;  Surgeon: Lin Landsman, MD;  Location: [place];  Service: Endoscopy;  Laterality: N/A;   LAPAROSCOPIC TUBAL LIGATION Bilateral 12/03/2021   Procedure: LAPAROSCOPIC TUBAL LIGATION;  Surgeon: Harlin Heys, MD;  Location: [facility];  Service: Gynecology;  Laterality: Bilateral;   WISDOM TOOTH EXTRACTION  2013     Family Psychiatric History: Please see initial evaluation for full details. I have reviewed the history. No updates at this time.     Family History:  Family History  Problem Relation Age of Onset   Depression Mother    Diabetes Maternal Grandfather    Congestive Heart Failure Maternal Grandfather    Diverticulitis Maternal Grandmother    Dementia Maternal Grandmother     Social History:  Social History   Socioeconomic History   Marital status: Married    Spouse name: Not on file   Number of children: Not on file   Years of education: Not on file   Highest education level: Not on file  Occupational History   Not on file  Tobacco Use   Smoking status: Every Day    Packs/day: 1.00    Years: 8.00    Total pack years: 8.00    Types: Cigarettes   Smokeless tobacco: Never  Vaping Use   Vaping Use: Never used  Substance and Sexual Activity   Alcohol use: No   Drug use: Not Currently    Types: Marijuana   Sexual activity: Yes    Birth control/protection: Surgical    Comment: BTL  Other Topics Concern   Not on file  Social History Narrative   Not on file   Social Determinants of Health   Financial Resource Strain: Low Risk  (12/11/2021)   Overall Financial Resource Strain (CARDIA)    Difficulty of Paying Living Expenses: Not very hard  Food Insecurity: No Food Insecurity (  10/11/2021)   Hunger Vital Sign    Worried About Running Out of Food in the Last Year: Never true    Ran Out of Food in the Last Year: Never true  Transportation Needs: No Transportation Needs (09/13/2021)   PRAPARE - Administrator, Civil Service (Medical): No    Lack of Transportation (Non-Medical): No  Physical Activity: Sufficiently Active (12/11/2021)   Exercise Vital Sign    Days of Exercise per Week: 5 days    Minutes of Exercise per Session: 60 min  Stress: No Stress Concern Present (11/07/2021)   [employer] of Occupational Health - Occupational Stress Questionnaire     Feeling of Stress : Only a little  Social Connections: Not on file    Allergies: No Known Allergies  Metabolic Disorder Labs: No results found for: "HGBA1C", "MPG" No results found for: "PROLACTIN" No results found for: "CHOL", "TRIG", "HDL", "CHOLHDL", "VLDL", "LDLCALC" Lab Results  Component Value Date   TSH 2.583 09/16/2021   TSH 2.813 07/10/2021    Therapeutic Level Labs: No results found for: "LITHIUM" No results found for: "VALPROATE" No results found for: "CBMZ"  Current Medications: Current Outpatient Medications  Medication Sig Dispense Refill   buprenorphine-naloxone (SUBOXONE) 8-2 mg SUBL SL tablet Place 1 tablet under the tongue daily. Patient uses 8mg  film bid     meclizine (ANTIVERT) 25 MG tablet Take 1 tablet (25 mg total) by mouth 3 (three) times daily as needed for dizziness. 30 tablet 0   meloxicam (MOBIC) 15 MG tablet Take 15 mg by mouth daily as needed.     methocarbamol (ROBAXIN) 500 MG tablet Take by mouth.     pantoprazole (PROTONIX) 40 MG tablet Take 1 tablet by mouth daily.     sertraline (ZOLOFT) 50 MG tablet Take 100 mg by mouth daily.     No current facility-administered medications for this visit.     Musculoskeletal: Strength & Muscle Tone:  N/A Gait & Station:  N/A Patient leans: N/A  Psychiatric Specialty Exam: Review of Systems  Last menstrual period 02/08/2022.There is no height or weight on file to calculate BMI.  General Appearance: {Appearance:22683}  Eye Contact:  {BHH EYE CONTACT:22684}  Speech:  Clear and Coherent  Volume:  Normal  Mood:  {BHH MOOD:22306}  Affect:  {Affect (PAA):22687}  Thought Process:  Coherent  Orientation:  Full (Time, Place, and Person)  Thought Content: Logical   Suicidal Thoughts:  {ST/HT (PAA):22692}  Homicidal Thoughts:  {ST/HT (PAA):22692}  Memory:  Immediate;   Good  Judgement:  {Judgement (PAA):22694}  Insight:  {Insight (PAA):22695}  Psychomotor Activity:  Normal  Concentration:   Concentration: Good and Attention Span: Good  Recall:  Good  Fund of Knowledge: Good  Language: Good  Akathisia:  No  Handed:  Right  AIMS (if indicated): not done  Assets:  Communication Skills Desire for Improvement  ADL's:  Intact  Cognition: WNL  Sleep:  {BHH GOOD/FAIR/POOR:22877}   Screenings: PHQ2-9    Flowsheet Row Video Visit from 12/05/2021 in [facility] Patient Outreach Telephone from 10/15/2021 in [facility] Care Coordination  PHQ-2 Total Score 1 4  PHQ-9 Total Score -- 10      Flowsheet Row ED from 03/06/2022 in [facility] from 03/04/2022 in [facility] from 02/26/2022 in [facility]  C-SSRS RISK CATEGORY No Risk No Risk No Risk  Assessment and Plan:  Kendra [name] is a 31 y.o. year old female with a history of depression, anxiety, history of opioid dependence on Suboxone, seizure, cervical spondylosis, r/o fibromyalgia, who presents for follow up appointment for below.       1. MDD (major depressive disorder), recurrent episode, mild (HCC) 2. Anxiety state She reports slight worsening in depressive symptoms and anxiety in the setting of ongoing somatic symptoms including chest pain since the last visit. Noted that she thinks her somatic symptoms has worsened since she had COVID in January.  She denies any significant psychosocial stressors otherwise, and reports great relationship with her daughter, her family, including her parents and grandmother.  We uptitrate sertraline to optimize treatment for depression and anxiety.  Provided psychoeducation of mindfulness.    # opioid dependence on Suboxone Unchanged. She has been on Suboxone for heroin dependence, prescribed at [facility].  She has been abstinent for the past 4 years without any craving.  Will continue motivational  interview.    Plan Increase sertraline 100 mg daily  Next appointment: 8/16 at 4 PM for 30 mins, video   The patient demonstrates the following risk factors for suicide: Chronic risk factors for suicide include: psychiatric disorder of depression . Acute risk factors for suicide include: N/A. Protective factors for this patient include: positive social support, responsibility to others (children, family), and hope for the future. Considering these factors, the overall suicide risk at this point appears to be low. Patient is appropriate for outpatient follow up.                Collaboration of Care: Collaboration of Care: {BH OP Collaboration of Care:21014065}  Patient/Guardian was advised Release of Information must be obtained prior to any record release in order to collaborate their care with an outside provider. Patient/Guardian was advised if they have not already done so to contact the registration department to sign all necessary forms in order for [place] to release information regarding their care.   Consent: Patient/Guardian gives verbal consent for treatment and assignment of benefits for services provided during this visit. Patient/Guardian expressed understanding and agreed to proceed.    Neysa Hotter, MD 03/12/2022, 10:44 AM

## 2022-03-12 NOTE — Patient Outreach (Signed)
Care Coordination  03/12/2022  ARNITRA SOKOLOSKI 1991/07/28 [id number]  Transition Care Management Unsuccessful Follow-up Telephone Call  Date of discharge and from where:  03/09/22 from [facility] ED and 03/10/22 from [facility] ED  Attempts:  2nd Attempt  Reason for unsuccessful TCM follow-up call:  Voice mail full  [name] RN, BSN [place]  [facility] Care Coordinator

## 2022-03-12 NOTE — ED Provider Notes (Signed)
-----------------------------------------   8:07 PM on 03/12/2022 -----------------------------------------  Blood pressure (!) 138/90, pulse 78, temperature 98.3 F (36.8 C), temperature source Oral, resp. rate 18, height 5\' 7"  (1.702 m), weight 99.8 kg, last menstrual period 02/08/2022, SpO2 100 %.  Assuming care from S. Fisher, PA-C/NP-C.  In short, Kendra [name] is a 31 y.o. female with a chief complaint of Chest Pain .  Refer to the original H&P for additional details.  The current plan of care is to await CTA.  ____________________________________________    Labs Reviewed  D-DIMER, QUANTITATIVE - Abnormal; Notable for the following components:      Result Value   D-Dimer, Quant 0.53 (*)    All other components within normal limits  BASIC METABOLIC PANEL  CBC  POC URINE PREG, ED  TROPONIN I (HIGH SENSITIVITY)  TROPONIN I (HIGH SENSITIVITY)   ____________________________________________   ____________________________________________    CT Angio Chest PE W and/or Wo Contrast  Result Date: 03/12/2022 CLINICAL DATA:  Left-sided chest pain and palpitations. EXAM: CT ANGIOGRAPHY CHEST WITH CONTRAST TECHNIQUE: Multidetector CT imaging of the chest was performed using the standard protocol during bolus administration of intravenous contrast. Multiplanar CT image reconstructions and MIPs were obtained to evaluate the vascular anatomy. RADIATION DOSE REDUCTION: This exam was performed according to the departmental dose-optimization program which includes automated exposure control, adjustment of the mA and/or kV according to patient size and/or use of iterative reconstruction technique. CONTRAST:  53mL OMNIPAQUE IOHEXOL 350 MG/ML SOLN COMPARISON:  January 28, 2022 FINDINGS: Cardiovascular: Satisfactory opacification of the pulmonary arteries to the segmental level. No evidence of pulmonary embolism. Normal heart size. No pericardial effusion. Mediastinum/Nodes: No enlarged mediastinal,  hilar, or axillary lymph nodes. Thyroid gland, trachea, and esophagus demonstrate no significant findings. Lungs/Pleura: Lungs are clear. No pleural effusion or pneumothorax. Upper Abdomen: No acute abnormality. Musculoskeletal: No chest wall abnormality. No acute or significant osseous findings. Review of the MIP images confirms the above findings. IMPRESSION: No CT evidence of pulmonary embolism or other acute intrathoracic pathology. Electronically Signed   By: January 30, 2022 M.D.   On: 03/12/2022 19:52   ____________________________________________  PROCEDURES   Procedures ____________________________________________  INITIAL IMPRESSION / ASSESSMENT AND PLAN / ED COURSE  See initial provider note for full history and exam.  Assumed care while awaiting CTA given slightly elevated D-dimer.  CTA was negative for acute pulmonary embolism.  Patient was reevaluated, reports that her pain has resolved and she feels very relieved that the CTA is negative.  She reports that she has follow-up tomorrow with her cardiologist.  We discussed return precautions and the importance of maintaining this close outpatient follow-up.  Patient understands and agrees with plan.  She was discharged in stable condition.    _________  FINAL CLINICAL IMPRESSION(S) / ED DIAGNOSES  Final diagnoses:  Atypical chest pain  Chest pain, unspecified type      03/14/2022, PA-C 03/12/22 2018    03/14/22, MD 03/15/22 [phone]

## 2022-03-12 NOTE — ED Notes (Signed)
Patient ambulated to and from bathroom with independent and steady gait.  

## 2022-03-13 ENCOUNTER — Telehealth: Payer: Medicaid Other | Admitting: Psychiatry

## 2022-03-13 ENCOUNTER — Telehealth: Payer: Self-pay | Admitting: Psychiatry

## 2022-03-13 ENCOUNTER — Telehealth: Payer: Self-pay | Admitting: *Deleted

## 2022-03-13 ENCOUNTER — Encounter: Payer: Self-pay | Admitting: Emergency Medicine

## 2022-03-13 ENCOUNTER — Ambulatory Visit (INDEPENDENT_AMBULATORY_CARE_PROVIDER_SITE_OTHER): Payer: Medicaid Other | Admitting: Obstetrics and Gynecology

## 2022-03-13 ENCOUNTER — Encounter: Payer: Self-pay | Admitting: Obstetrics and Gynecology

## 2022-03-13 ENCOUNTER — Emergency Department
Admission: EM | Admit: 2022-03-13 | Discharge: 2022-03-13 | Disposition: A | Discharge status: Home / Self Care | Payer: Medicaid Other | Attending: Emergency Medicine | Admitting: Emergency Medicine

## 2022-03-13 VITALS — BP 113/70 | HR 89 | Ht 67.0 in | Wt 226.2 lb

## 2022-03-13 DIAGNOSIS — R791 Abnormal coagulation profile: Secondary | ICD-10-CM | POA: Insufficient documentation

## 2022-03-13 DIAGNOSIS — R55 Syncope and collapse: Secondary | ICD-10-CM | POA: Diagnosis present

## 2022-03-13 DIAGNOSIS — Z113 Encounter for screening for infections with a predominantly sexual mode of transmission: Secondary | ICD-10-CM

## 2022-03-13 DIAGNOSIS — L989 Disorder of the skin and subcutaneous tissue, unspecified: Secondary | ICD-10-CM

## 2022-03-13 LAB — TROPONIN I (HIGH SENSITIVITY): Troponin I (High Sensitivity): <2 ng/L (ref <18)

## 2022-03-13 NOTE — ED Triage Notes (Signed)
Patient reports passing out this afternoon while sitting in car. States she was at an appt for STD testing and had labs drawn. Denies any pain.

## 2022-03-13 NOTE — Patient Outreach (Signed)
Care Coordination  03/13/2022  Kendra [name] 1990-10-09 [id number]  Transition Care Management Follow-up Telephone Call Date of discharge and from where: 03/09/22 from [facility] ED, 03/10/22 from [facility] ED and 03/12/22 from [facility] How have you been since you were released from the hospital? "Doing Ok" Any questions or concerns? No  Items Reviewed: Did the pt receive and understand the discharge instructions provided? Yes  Medications obtained and verified? Yes , new medication-valium Other? No  Any new allergies since your discharge? No  Dietary orders reviewed? Yes Do you have support at home? Yes   Home Care and Equipment/Supplies: Were home health services ordered? no If so, what is the name of the agency? N/A  Has the agency set up a time to come to the patient's home? not applicable Were any new equipment or medical supplies ordered?  No What is the name of the medical supply agency? N/A Were you able to get the supplies/equipment? not applicable Do you have any questions related to the use of the equipment or supplies? No  Functional Questionnaire: (I = Independent and D = Dependent) ADLs: I  Bathing/Dressing- I  Meal Prep- I  Eating- I  Maintaining continence- I  Transferring/Ambulation- I  Managing Meds- I  Follow up appointments reviewed:  PCP Hospital f/u appt confirmed? No , Patient discharged from ED, next PCP appointment is in September . Specialist Hospital f/u appt confirmed? Yes  Patient had appointment with Cardiology this morning 03/13/22 . Are transportation arrangements needed? No  If their condition worsens, is the pt aware to call PCP or go to the Emergency Dept.? Yes Was the patient provided with contact information for the PCP's office or ED? No Was to pt encouraged to call back with questions or concerns? Yes  [name] RN, BSN [place]  [employer]

## 2022-03-13 NOTE — Telephone Encounter (Signed)
Sent link for video visit through Epic. Patient did not sign in. Called the patient for appointment scheduled today.  She states that she has just left the hospital, and will not be able to do a video visit.  She agrees to schedule an appointment tomorrow.

## 2022-03-13 NOTE — ED Provider Notes (Addendum)
[facility] Provider Note    Event Date/Time   First MD Initiated Contact with Patient 03/13/22 1237     (approximate)   History   Loss of Consciousness   HPI  Kendra [name] is a 31 y.o. female who comes in for loss of consciousness.  Patient was seen here yesterday for chest pain and had a slightly elevated D-dimer so underwent CT imaging.  CT angio was without evidence of pulmonary embolism.  Patient reports that she did eat breakfast this morning and that she had gone and had some blood drawn and then about 20 minutes later she was walking on the building and sat down in the car when she started to feel nauseous and then felt lightheaded and had brief LOC for about 10 seconds while sitting down.  She did not hit her head no seizure activity no tongue biting.  Patient does report multiple episodes of syncope in the past but not just while sitting.  She does report seeing cardiology this morning and having a Holter monitor in place and having a cardiac MRI that they are going to do but she is had extensive cardiac work-up previously they have all been negative.     Physical Exam   Triage Vital Signs: ED Triage Vitals  Enc Vitals Group     BP 03/13/22 1227 (!) 115/94     Pulse Rate 03/13/22 1227 93     Resp 03/13/22 1227 18     Temp 03/13/22 1227 98 F (36.7 C)     Temp Source 03/13/22 1227 Oral     SpO2 03/13/22 1227 99 %     Weight --      Height --      Head Circumference --      Peak Flow --      Pain Score 03/13/22 1226 0     Pain Loc --      Pain Edu? --      Excl. in GC? --     Most recent vital signs: Vitals:   03/13/22 1227  BP: (!) 115/94  Pulse: 93  Resp: 18  Temp: 98 F (36.7 C)  SpO2: 99%     General: Awake, no distress.  CV:  Good peripheral perfusion.  Resp:  Normal effort.  Abd:  No distention.  Soft and nontender Other:  Pupils equal round and reactive equal strength in arms and legs.  Finger-nose  intact.   ED Results / Procedures / Treatments   Labs (all labs ordered are listed, but only abnormal results are displayed) Labs Reviewed  TROPONIN I (HIGH SENSITIVITY)     EKG  My interpretation of EKG:  Normal sinus rate of 64 without any ST elevation or T wave inversions, normal intervals  RADIOLOGY I have reviewed the CT personally interpreted from yesterday and do not see evidence of pulmonary embolism  PROCEDURES:  Critical Care performed: No  Procedures   MEDICATIONS ORDERED IN ED: Medications - No data to display   IMPRESSION / MDM / ASSESSMENT AND PLAN / ED COURSE  I reviewed the triage vital signs and the nursing notes.   Patient's presentation is most consistent with acute presentation with potential threat to life or bodily function.   I reviewed patient's labs from yesterday where she had a negative pregnancy test 2 negative cardiac markers D-dimer that was slightly elevated BMP that was normal CBC that was normal.  I have lower suspicion that this is cardiac in  nature but she is already having extensive work-up done outpatient to figure out if this is cardiac.  We discussed calling her cardiologist letting them know that this happen so they can review the Holter monitor.  No signs to suggest seizures.  No falls or hitting her head.  She had a pregnancy test yesterday that was negative and reports currently menstruating so declines repeat testing today  We did discuss utility of repeat troponin given she is had multiple ER visits for chest pain and syncope but blood work had already been drawn by the nurse so we will get a troponin per patient request.  Patient troponin is negative.  Repeat EKG is reassuring.  Patient is tolerating eating some crackers and water blood pressures have been normal.  Will ambulate patient and suspect discharge home as long as no recurrent syncope.  Patient's been here for over 3 hour.  On reevaluation patient is tearful stating  it is frustrating that they cannot figure out what is causing this however we had extensive conversation about how she is doing all the right things to try to get this worked up and she is going to let her cardiology team know that she did have another event today but that she has been told that they do not feel it is most likely not cardiac.  We discussed seeing neurology for tilt table testing, postural orthostatic hypotension testing etc. and explained how this work-up is not typically done inpatient.  We discussed precautions to avoid getting hurt when having syncopal episodes.  We discussed not driving or being in water.  She expressed understanding and will continue to work the syncopal episodes up outpatient       FINAL CLINICAL IMPRESSION(S) / ED DIAGNOSES   Final diagnoses:  Syncope, unspecified syncope type     Rx / DC Orders   ED Discharge Orders     None        Note:  This document was prepared using Dragon voice recognition software and may include unintentional dictation errors.   Concha Se, MD 03/13/22 1448    Concha Se, MD 03/13/22 [phone]

## 2022-03-13 NOTE — Progress Notes (Signed)
HPI:      [name] is a 31 y.o. G2P1011 who LMP was Patient's last menstrual period was 02/08/2022 (approximate).  Subjective:   She presents today stating that she again had an outbreak of bumps on her labia that were very painful.  She states the outbreak lasted about a week.  She is concerned that it may be herpes.  She reports that her husband has never had herpes.  She states that the outbreak is completely gone at this time. She did provide a picture of the outbreak which appears to be ruptured and intact small vesicles.    Hx: The following portions of the patient's history were reviewed and updated as appropriate:             She  has a past medical history of Anxiety, Depression, GERD (gastroesophageal reflux disease), Headache, Heart murmur, History of cannabis abuse, Seizures (HCC), Tobacco abuse, and Vitamin D deficiency. She does not have any pertinent problems on file. She  has a past surgical history that includes Wisdom tooth extraction (2013); Colon surgery (2013); Colonoscopy with propofol (N/A, 12/14/2015); Esophagogastroduodenoscopy (egd) with propofol (N/A, 12/14/2015); Laparoscopic tubal ligation (Bilateral, 12/03/2021); and Esophagogastroduodenoscopy (egd) with propofol (N/A, 02/21/2022). Her family history includes Congestive Heart Failure in her maternal grandfather; Dementia in her maternal grandmother; Depression in her mother; Diabetes in her maternal grandfather; Diverticulitis in her maternal grandmother. She  reports that she has been smoking cigarettes. She has a 8.00 pack-year smoking history. She has never used smokeless tobacco. She reports that she does not currently use drugs after having used the following drugs: Marijuana. She reports that she does not drink alcohol. She has a current medication list which includes the following prescription(s): buprenorphine-naloxone, meclizine, meloxicam, methocarbamol, pantoprazole, and sertraline. She has No Known  Allergies.       Review of Systems:  Review of Systems  Constitutional: Denied constitutional symptoms, night sweats, recent illness, fatigue, fever, insomnia and weight loss.  Eyes: Denied eye symptoms, eye pain, photophobia, vision change and visual disturbance.  Ears/Nose/Throat/Neck: Denied ear, nose, throat or neck symptoms, hearing loss, nasal discharge, sinus congestion and sore throat.  Cardiovascular: Denied cardiovascular symptoms, arrhythmia, chest pain/pressure, edema, exercise intolerance, orthopnea and palpitations.  Respiratory: Denied pulmonary symptoms, asthma, pleuritic pain, productive sputum, cough, dyspnea and wheezing.  Gastrointestinal: Denied, gastro-esophageal reflux, melena, nausea and vomiting.  Genitourinary: See HPI for additional information.  Musculoskeletal: Denied musculoskeletal symptoms, stiffness, swelling, muscle weakness and myalgia.  Dermatologic: Denied dermatology symptoms, rash and scar.  Neurologic: Denied neurology symptoms, dizziness, headache, neck pain and syncope.  Psychiatric: Denied psychiatric symptoms, anxiety and depression.  Endocrine: Denied endocrine symptoms including hot flashes and night sweats.   Meds:   Current Outpatient Medications on File Prior to Visit  Medication Sig Dispense Refill   buprenorphine-naloxone (SUBOXONE) 8-2 mg SUBL SL tablet Place 1 tablet under the tongue daily. Patient uses 8mg  film bid     meclizine (ANTIVERT) 25 MG tablet Take 1 tablet (25 mg total) by mouth 3 (three) times daily as needed for dizziness. 30 tablet 0   meloxicam (MOBIC) 15 MG tablet Take 15 mg by mouth daily as needed.     methocarbamol (ROBAXIN) 500 MG tablet Take by mouth.     pantoprazole (PROTONIX) 40 MG tablet Take 1 tablet by mouth daily.     sertraline (ZOLOFT) 50 MG tablet Take 100 mg by mouth daily.     No current facility-administered medications on file prior to visit.  Objective:     Vitals:   03/13/22 1107  BP:  113/70  Pulse: 89   Filed Weights   03/13/22 1107  Weight: 226 lb 3.2 oz (102.6 kg)                        Assessment:    G2P1011 Patient Active Problem List   Diagnosis Date Noted   Chronic GERD    Gastric erythema    Atypical chest pain 02/05/2022   Frequent patient in emergency department 01/22/2022   Seizure-like activity (HCC) 10/11/2021   Noninfectious diarrhea    Non-intractable cyclical vomiting with nausea    Diarrhea    Anxiety and depression 01/19/2015   History of cannabis dependence/abuse (HCC) 01/19/2015   Tobacco abuse 01/19/2015   Allergic rhinitis 08/04/2007   Attention deficit disorder 04/18/2007     1. Screening for STD (sexually transmitted disease)   2. Bumps on skin     Possible HSV   Plan:            1.  HSV antibody testing  HSV  I have discussed Herpes in detail with the patient.  Both HSV Type I and II were reviewed.  The sexually transmitted nature of HSV I and II was discussed.  The natural course and history of Herpes was discussed with the patient.  Management of repeat outbreaks, including the use of Anti-viral agents for prophylaxis, as well as intermittent treatment, was discussed.  The transmission of Herpes was discussed and I informed her that Herpes could be transmitted at any time, but the most likely time of transmission was when the patient had lesions or noticed prodromal symptoms.  Literature regarding Herpes was provided to the patient.  All of her questions have been answered and I believe she has an adequate understanding of Genital Herpes.  Orders Orders Placed This Encounter  Procedures   HSV(herpes simplex vrs) 1+2 ab-IgG    No orders of the defined types were placed in this encounter.     F/U  Return for We will contact her with any abnormal test results. I spent 21 minutes involved in the care of this patient preparing to see the patient by obtaining and reviewing her medical history (including labs, imaging tests  and prior procedures), documenting clinical information in the electronic health record (EHR), counseling and coordinating care plans, writing and sending prescriptions, ordering tests or procedures and in direct communicating with the patient and medical staff discussing pertinent items from her history and physical exam.  Elonda Husky, M.D. 03/13/2022 11:39 AM

## 2022-03-13 NOTE — Progress Notes (Unsigned)
Virtual Visit via Video Note  I connected with Kendra [name] on 03/14/22 at  4:00 PM EDT by a video enabled telemedicine application and verified that I am speaking with the correct person using two identifiers.  Location: Patient: home Provider: office Persons participated in the visit- patient, provider    I discussed the limitations of evaluation and management by telemedicine and the availability of in person appointments. The patient expressed understanding and agreed to proceed.    I discussed the assessment and treatment plan with the patient. The patient was provided an opportunity to ask questions and all were answered. The patient agreed with the plan and demonstrated an understanding of the instructions.   The patient was advised to call back or seek an in-person evaluation if the symptoms worsen or if the condition fails to improve as anticipated.  I provided 17 minutes of non-face-to-face time during this encounter.   Kendra Hotter, MD    [facility] MD/PA/NP OP Progress Note  03/14/2022 4:37 PM ESTHELA [name]  MRN:  [id number]  Chief Complaint:  Chief Complaint  Patient presents with   Follow-up   Depression   HPI:  -Since the last visit, she was seen at [facility] neurology for possible thoracic outlet syndrome. The patient did not have any significant findings consistent with this diagnosis.  She is referred to be seen by orthopedics.  -Since the last visit, she visited ED several times for spinning sensation and chest pain.   This is a follow-up appointment for depression.  She states that she is not doing well.  She has been to the Emergency Room for dizziness and chest pain.  They have not been able to find out the cause, and she does not know what to do.  She states that she has not taken sertraline.  She was prescribed Abilify by her neurologist.  She does not like this medication as it makes her feel tired.  She did not want to take surgery as she was told not to  take both of the medication due to potential interaction of the medication.  She had palpitation and then anxiety on the way to her aunt.  EMS was called.  She wants to figure out what is happening as it is so tiring and she feels scared.  She states that she was doing completely fine with no stress.  They are going well with her family.  She tries not to be alone as she is concerned about these episodes.  She has been able to be take care of her daughter.  She has fair sleep.  She has decrease in appetite.  She feels depressed.  She denies SI.  She denies alcohol use or drug use.  She would like to speak with her neurologist about change in medication.   Daily routine: household chores, gardening, sees her grandmother, helps her daughter to go to school Support: mother, grandmother, father Household: husband, daughter Marital status: married Number of children: 1 daughter, 64 yo Employment: unemployed, used to work in Physiological scientist, Engineer, civil (consulting):  had college experience in [place] Last PCP / ongoing medical evaluation:   Born in [place], grew up in [place] at age [age]. She moved from [place] to [place] to be closer with her grandmother and her father. She reports good childhood, although it was challenging when her parents got divorced when she was 2 year old. She describes her mother as a good mother, and reports great relationship with both of her parents.  Visit Diagnosis:    ICD-10-CM   1. MDD (major depressive disorder), recurrent episode, mild (HCC)  F33.0     2. Anxiety state  F41.1       Past Psychiatric History: Please see initial evaluation for full details. I have reviewed the history. No updates at this time.     Past Medical History:  Past Medical History:  Diagnosis Date   Anxiety    Depression    GERD (gastroesophageal reflux disease)    Headache    stress   Heart murmur    History of cannabis abuse    Seizures (HCC)    age 82. syncopal episode. Hit  head. had seizure.    Tobacco abuse    Vitamin D deficiency     Past Surgical History:  Procedure Laterality Date   COLON SURGERY  2013   pt states "colonoscopy while awake"   COLONOSCOPY WITH PROPOFOL N/A 12/14/2015   Procedure: COLONOSCOPY WITH PROPOFOL;  Surgeon: Midge Minium, MD;  Location: [facility];  Service: Endoscopy;  Laterality: N/A;   ESOPHAGOGASTRODUODENOSCOPY (EGD) WITH PROPOFOL N/A 12/14/2015   Procedure: ESOPHAGOGASTRODUODENOSCOPY (EGD) WITH PROPOFOL;  Surgeon: Midge Minium, MD;  Location: [facility];  Service: Endoscopy;  Laterality: N/A;   ESOPHAGOGASTRODUODENOSCOPY (EGD) WITH PROPOFOL N/A 02/21/2022   Procedure: ESOPHAGOGASTRODUODENOSCOPY (EGD) WITH BIOPSY;  Surgeon: Toney Reil, MD;  Location: [facility];  Service: Endoscopy;  Laterality: N/A;   LAPAROSCOPIC TUBAL LIGATION Bilateral 12/03/2021   Procedure: LAPAROSCOPIC TUBAL LIGATION;  Surgeon: Linzie Collin, MD;  Location: [facility] OR[facility];  Service: Gynecology;  Laterality: Bilateral;   WISDOM TOOTH EXTRACTION  2013    Family Psychiatric History: Please see initial evaluation for full details. I have reviewed the history. No updates at this time.    Family History:  Family History  Problem Relation Age of Onset   Depression Mother    Diabetes Maternal Grandfather    Congestive Heart Failure Maternal Grandfather    Diverticulitis Maternal Grandmother    Dementia Maternal Grandmother     Social History:  Social History   Socioeconomic History   Marital status: Married    Spouse name: Not on file   Number of children: Not on file   Years of education: Not on file   Highest education level: Not on file  Occupational History   Not on file  Tobacco Use   Smoking status: Every Day    Packs/day: 1.00    Years: 8.00    Total pack years: 8.00    Types: Cigarettes   Smokeless tobacco: Never  Vaping Use   Vaping Use: Never used  Substance and Sexual Activity   Alcohol use: No    Drug use: Not Currently    Types: Marijuana   Sexual activity: Yes    Birth control/protection: Surgical    Comment: BTL  Other Topics Concern   Not on file  Social History Narrative   Not on file   Social Determinants of Health   Financial Resource Strain: Low Risk  (12/11/2021)   Overall Financial Resource Strain (CARDIA)    Difficulty of Paying Living Expenses: Not very hard  Food Insecurity: No Food Insecurity (10/11/2021)   Hunger Vital Sign    Worried About Running Out of Food in the Last Year: Never true    Ran Out of Food in the Last Year: Never true  Transportation Needs: No Transportation Needs (09/13/2021)   PRAPARE - Administrator, Civil Service (Medical): No  Lack of Transportation (Non-Medical): No  Physical Activity: Sufficiently Active (12/11/2021)   Exercise Vital Sign    Days of Exercise per Week: 5 days    Minutes of Exercise per Session: 60 min  Stress: No Stress Concern Present (11/07/2021)   [employer] of Occupational Health - Occupational Stress Questionnaire    Feeling of Stress : Only a little  Social Connections: Not on file    Allergies: No Known Allergies  Metabolic Disorder Labs: No results found for: "HGBA1C", "MPG" No results found for: "PROLACTIN" No results found for: "CHOL", "TRIG", "HDL", "CHOLHDL", "VLDL", "LDLCALC" Lab Results  Component Value Date   TSH 2.583 09/16/2021   TSH 2.813 07/10/2021    Therapeutic Level Labs: No results found for: "LITHIUM" No results found for: "VALPROATE" No results found for: "CBMZ"  Current Medications: Current Outpatient Medications  Medication Sig Dispense Refill   buprenorphine-naloxone (SUBOXONE) 8-2 mg SUBL SL tablet Place 1 tablet under the tongue daily. Patient uses 8mg  film bid     meclizine (ANTIVERT) 25 MG tablet Take 1 tablet (25 mg total) by mouth 3 (three) times daily as needed for dizziness. 30 tablet 0   meloxicam (MOBIC) 15 MG tablet Take 15 mg by mouth daily as  needed.     methocarbamol (ROBAXIN) 500 MG tablet Take by mouth.     pantoprazole (PROTONIX) 40 MG tablet Take 1 tablet by mouth daily.     sertraline (ZOLOFT) 50 MG tablet Take 100 mg by mouth daily.     No current facility-administered medications for this visit.     Musculoskeletal: Strength & Muscle Tone:  N/A Gait & Station:  N/A Patient leans: N/A  Psychiatric Specialty Exam: Review of Systems  Psychiatric/Behavioral:  Positive for dysphoric mood. Negative for agitation, behavioral problems, confusion, decreased concentration, hallucinations, self-injury, sleep disturbance and suicidal ideas. The patient is nervous/anxious. The patient is not hyperactive.   All other systems reviewed and are negative.   Last menstrual period 02/08/2022.There is no height or weight on file to calculate BMI.  General Appearance: Fairly Groomed  Eye Contact:  Good  Speech:  Clear and Coherent  Volume:  Normal  Mood:   not good  Affect:  Appropriate, Congruent, and slightly down  Thought Process:  Coherent  Orientation:  Full (Time, Place, and Person)  Thought Content: Logical   Suicidal Thoughts:  No  Homicidal Thoughts:  No  Memory:  Immediate;   Good  Judgement:  Good  Insight:  Good  Psychomotor Activity:  Normal  Concentration:  Concentration: Good and Attention Span: Good  Recall:  Good  Fund of Knowledge: Good  Language: Good  Akathisia:  No  Handed:  Right  AIMS (if indicated): not done  Assets:  Communication Skills Desire for Improvement  ADL's:  Intact  Cognition: WNL  Sleep:  Good   Screenings: PHQ2-9    Flowsheet Row Video Visit from 12/05/2021 in [facility] Patient Outreach Telephone from 10/15/2021 in [facility] Care Coordination  PHQ-2 Total Score 1 4  PHQ-9 Total Score -- 10      Flowsheet Row ED from 03/13/2022 in [facility] from 03/12/2022 in [facility] from 03/06/2022 in [facility]  C-SSRS RISK CATEGORY No Risk No Risk No Risk        Assessment and Plan:  Kendra [name] is a 31 y.o. 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    1. MDD (major depressive disorder), recurrent episode, mild (HCC) 2. Anxiety state She continues to report significant anxiety and depressive symptoms due to somatic symptoms of chest pain, this is as from unknown origin. Noted that she thinks her somatic symptoms has worsened since she had COVID in January. She denies any significant psychosocial stressors otherwise, and reports great relationship with her daughter, her family, including her parents and grandmother.  Although the plan was to uptitrate sertraline, this occasion was discontinued and Abilify was started by neurologist.  She was recommended to restart and uptitrate sertraline to target depression and anxiety.  Provided psychoeducation about risk of QTc prolongation and serotonin syndrome with concomitant use of suboxone. She wants to speak with her neurologist about this medication change.   # opioid dependence on Suboxone Unchanged. She has been on Suboxone for heroin dependence, prescribed at [facility].  She has been abstinent for the past 4 years without any craving.  Will continue motivational interview.    Plan Start sertraline 50 mg at night for 1 week, then 100 mg at night Discontinue Abilify (prescribed by neurologist) Next appointment: 10/6 at 9:30 for 30 mins, video   The patient demonstrates the following risk factors for suicide: Chronic risk factors for suicide include: psychiatric disorder of depression . Acute risk factors for suicide include: N/A. Protective factors for this patient include: positive social  support, responsibility to others (children, family), and hope for the future. Considering these factors, the overall suicide risk at this point appears to be low. Patient is appropriate for outpatient follow up.           Collaboration of Care: Collaboration of Care: Other N/A  Patient/Guardian was advised Release of Information must be obtained prior to any record release in order to collaborate their care with an outside provider. Patient/Guardian was advised if they have not already done so to contact the registration department to sign all necessary forms in order for [place] to release information regarding their care.   Consent: Patient/Guardian gives verbal consent for treatment and assignment of benefits for services provided during this visit. Patient/Guardian expressed understanding and agreed to proceed.    Kendra Hotter, MD 03/14/2022, 4:37 PM

## 2022-03-13 NOTE — Progress Notes (Signed)
Patient presents today to discuss vaginal bumps. She states they are on the outside as well as internal and are painful. Patient states recent GC-CT test that were negative. No other concerns at this time.

## 2022-03-13 NOTE — Discharge Instructions (Addendum)
Please call the cardiologist and let them know that you had another syncopal episode so they can decide if they want you to turn in the Holter monitor early.  Your EKG and troponin was reassuring here.  We have elected to hold off on repeat blood work since you just had it yesterday.  Continue to work-up with your cardiologist and neurologist to help figure out what is causing your recurrent syncopal episodes and return to the ER if develop worsening symptoms or any other concerns  Do not drive.. Use caution when using heavy equipment or power tools. Avoid working on ladders or at heights. Take showers instead of baths. Ensure the water temperature is not too high on the home water heater. Do not go swimming alone. When caring for infants or small children, sit down when holding, feeding, or changing them to minimize risk of injury to the child in the event you have a syncopal episode.  Also, Maintain good sleep hygiene. Avoid alcohol.

## 2022-03-14 ENCOUNTER — Telehealth (INDEPENDENT_AMBULATORY_CARE_PROVIDER_SITE_OTHER): Payer: Medicaid Other | Admitting: Psychiatry

## 2022-03-14 ENCOUNTER — Encounter: Payer: Self-pay | Admitting: Psychiatry

## 2022-03-14 DIAGNOSIS — F330 Major depressive disorder, recurrent, mild: Secondary | ICD-10-CM | POA: Diagnosis not present

## 2022-03-14 DIAGNOSIS — F411 Generalized anxiety disorder: Secondary | ICD-10-CM

## 2022-03-14 LAB — HSV-2 IGG SUPPLEMENTAL TEST

## 2022-03-14 LAB — HSV(HERPES SIMPLEX VRS) I + II AB-IGG
HSV 1 Glycoprotein G Ab, IgG: 11.2 index — ABNORMAL HIGH (ref 0.00–0.90)
HSV 2 IgG, Type Spec: 4.48 index — ABNORMAL HIGH (ref 0.00–0.90)

## 2022-03-15 ENCOUNTER — Telehealth: Payer: Self-pay | Admitting: *Deleted

## 2022-03-15 NOTE — Patient Outreach (Signed)
Care Coordination  03/15/2022  MATTISON GOLAY 17-Aug-1990 [id number]  Transition Care Management Unsuccessful Follow-up Telephone Call  Date of discharge and from where:  03/13/22 from [facility]  Attempts:  1st Attempt  Reason for unsuccessful TCM follow-up call:  Voice mail full  [name] RN, BSN [place]  [facility] Care Coordinator

## 2022-03-18 ENCOUNTER — Telehealth: Payer: Self-pay | Admitting: Psychiatry

## 2022-03-18 ENCOUNTER — Encounter: Payer: Self-pay | Admitting: Emergency Medicine

## 2022-03-18 ENCOUNTER — Emergency Department: Payer: Medicaid Other

## 2022-03-18 ENCOUNTER — Emergency Department
Admission: EM | Admit: 2022-03-18 | Discharge: 2022-03-18 | Disposition: A | Discharge status: Home / Self Care | Payer: Medicaid Other | Attending: Emergency Medicine | Admitting: Emergency Medicine

## 2022-03-18 ENCOUNTER — Other Ambulatory Visit: Payer: Self-pay

## 2022-03-18 DIAGNOSIS — R0789 Other chest pain: Secondary | ICD-10-CM | POA: Insufficient documentation

## 2022-03-18 DIAGNOSIS — G8929 Other chronic pain: Secondary | ICD-10-CM | POA: Insufficient documentation

## 2022-03-18 LAB — CBC
HCT: 41.8 % (ref 36.0–46.0)
Hemoglobin: 13.9 g/dL (ref 12.0–15.0)
MCH: 33.2 pg (ref 26.0–34.0)
MCHC: 33.3 g/dL (ref 30.0–36.0)
MCV: 99.8 fL (ref 80.0–100.0)
Platelets: 296 10*3/uL (ref 150–400)
RBC: 4.19 MIL/uL (ref 3.87–5.11)
RDW: 11.9 % (ref 11.5–15.5)
WBC: 4.7 10*3/uL (ref 4.0–10.5)
nRBC: 0 % (ref 0.0–0.2)

## 2022-03-18 LAB — URINALYSIS, ROUTINE W REFLEX MICROSCOPIC
Bilirubin Urine: NEGATIVE
Glucose, UA: NEGATIVE mg/dL
Hgb urine dipstick: NEGATIVE
Ketones, ur: NEGATIVE mg/dL
Leukocytes,Ua: NEGATIVE
Nitrite: NEGATIVE
Protein, ur: 30 mg/dL — AB
Specific Gravity, Urine: 1.016 (ref 1.005–1.030)
Squamous Epithelial / HPF: >50 — ABNORMAL HIGH (ref 0–5)
pH: 8 (ref 5.0–8.0)

## 2022-03-18 LAB — HEPATIC FUNCTION PANEL
ALT: 17 U/L (ref 0–44)
AST: 16 U/L (ref 15–41)
Albumin: 4.2 g/dL (ref 3.5–5.0)
Alkaline Phosphatase: 49 U/L (ref 38–126)
Bilirubin, Direct: <0.1 mg/dL (ref 0.0–0.2)
Total Bilirubin: 0.7 mg/dL (ref 0.3–1.2)
Total Protein: 7.6 g/dL (ref 6.5–8.1)

## 2022-03-18 LAB — BASIC METABOLIC PANEL
Anion gap: 5 (ref 5–15)
BUN: 8 mg/dL (ref 6–20)
CO2: 28 mmol/L (ref 22–32)
Calcium: 9.5 mg/dL (ref 8.9–10.3)
Chloride: 105 mmol/L (ref 98–111)
Creatinine, Ser: 0.84 mg/dL (ref 0.44–1.00)
GFR, Estimated: >60 mL/min (ref >60)
Glucose, Bld: 101 mg/dL — ABNORMAL HIGH (ref 70–99)
Potassium: 3.9 mmol/L (ref 3.5–5.1)
Sodium: 138 mmol/L (ref 135–145)

## 2022-03-18 LAB — TSH: TSH: 2.145 u[IU]/mL (ref 0.350–4.500)

## 2022-03-18 LAB — TROPONIN I (HIGH SENSITIVITY)
Troponin I (High Sensitivity): <2 ng/L (ref <18)
Troponin I (High Sensitivity): <2 ng/L (ref <18)

## 2022-03-18 LAB — LIPASE, BLOOD: Lipase: 25 U/L (ref 11–51)

## 2022-03-18 LAB — T4, FREE: Free T4: 0.62 ng/dL (ref 0.61–1.12)

## 2022-03-18 NOTE — ED Notes (Signed)
Agree with previous RN assessment, pt breathing e/u at this time.

## 2022-03-18 NOTE — Telephone Encounter (Signed)
Pt left vm at 4:53 pm. Her appointment is 05-03-22 but thinking she needs a mental health evaluation due to everything that has been happening. Here currently at [facility] tying to see if she needs psych eval to be admitted as in patient due to everything that is going on. Just wanted to see if she would have a second opinion but will have to wait to be evaluated since cannot get hold of anyone.wanted you to be aware

## 2022-03-18 NOTE — ED Provider Notes (Signed)
[facility] Provider Note  Patient Contact: 4:42 PM (approximate)   History   Chest Pain   HPI  Kendra [name] is a 31 y.o. female with a history of anxiety, atypical chest pain, headaches, and GERD presents to the emergency department with left-sided chest pain that has occurred every day for the past 8 to 9 months.  Patient states that chest pain is not provoked with exertion and patient states that it can occur randomly throughout the day.  Patient states that she has tried meditation, exercise and antianxiety medications with little relief.  She states that she does have a counselor and is under the care of psychiatry but states that she does not have a follow-up appointment till October 6.  She states that she is mentally exhausted and is concerned that she needs to be evaluated by psychiatry in the emergency department.  She declines prior psychiatric admissions.  Denies suicidal or homicidal ideation at this time.  She denies associated shortness of breath, fever, vomiting or diarrhea.  She is an established patient with cardiology and is currently wearing a Holter monitor.  She has had an extensive work-up including an echo and anticipates having an MRI of her chest in the upcoming weeks.  Patient continues to take Suboxone as she is a reformed heroin user and states that she continues to be clean.      Physical Exam   Triage Vital Signs: ED Triage Vitals  Enc Vitals Group     BP 03/18/22 1319 116/63     Pulse Rate 03/18/22 1319 79     Resp 03/18/22 1319 17     Temp 03/18/22 1319 98.3 F (36.8 C)     Temp Source 03/18/22 1319 Oral     SpO2 03/18/22 1319 100 %     Weight 03/18/22 1633 226 lb 3.1 oz (102.6 kg)     Height 03/18/22 1633 5\' 7"  (1.702 m)     Head Circumference --      Peak Flow --      Pain Score 03/18/22 1328 8     Pain Loc --      Pain Edu? --      Excl. in GC? --     Most recent vital signs: Vitals:   03/18/22 1319 03/18/22  1824  BP: 116/63 120/60  Pulse: 79 80  Resp: 17 16  Temp: 98.3 F (36.8 C) 98 F (36.7 C)  SpO2: 100% 100%     General: Alert and in no acute distress. Eyes:  PERRL. EOMI. Head: No acute traumatic findings ENT:      Nose: No congestion/rhinnorhea.      Mouth/Throat: Mucous membranes are moist. Neck: No stridor. No cervical spine tenderness to palpation. Cardiovascular:  Good peripheral perfusion Respiratory: Normal respiratory effort without tachypnea or retractions. Lungs CTAB. Good air entry to the bases with no decreased or absent breath sounds. Gastrointestinal: Bowel sounds 4 quadrants. Soft and nontender to palpation. No guarding or rigidity. No palpable masses. No distention. No CVA tenderness. Musculoskeletal: Full range of motion to all extremities.  Neurologic:  No gross focal neurologic deficits are appreciated.  Skin:   No rash noted Other:   ED Results / Procedures / Treatments   Labs (all labs ordered are listed, but only abnormal results are displayed) Labs Reviewed  BASIC METABOLIC PANEL - Abnormal; Notable for the following components:      Result Value   Glucose, Bld 101 (*)  All other components within normal limits  URINALYSIS, ROUTINE W REFLEX MICROSCOPIC - Abnormal; Notable for the following components:   Color, Urine YELLOW (*)    APPearance CLOUDY (*)    Protein, ur 30 (*)    Bacteria, UA RARE (*)    Squamous Epithelial / LPF >50 (*)    All other components within normal limits  CBC  TSH  T4, FREE  HEPATIC FUNCTION PANEL  LIPASE, BLOOD  POC URINE PREG, ED  TROPONIN I (HIGH SENSITIVITY)  TROPONIN I (HIGH SENSITIVITY)     EKG  Normal sinus rhythm without ST segment elevation or other apparent arrhythmia.   RADIOLOGY  I personally viewed and evaluated these images as part of my medical decision making, as well as reviewing the written report by the radiologist.  ED Provider Interpretation:    PROCEDURES:  Critical Care  performed: No  Procedures   MEDICATIONS ORDERED IN ED: Medications - No data to display   IMPRESSION / MDM / ASSESSMENT AND PLAN / ED COURSE  I reviewed the triage vital signs and the nursing notes.                              Assessment and plan: Left sided chest pain:  Differential diagnosis includes, but is not limited to, STEMI, NSTEMI, hypothyroidism, anxiety, costochondritis...  31 year old female presents to the emergency department with persistent daily left-sided chest pain.  Vital signs were reassuring at triage.  On physical exam, patient was alert, active and nontoxic-appearing.  She had no increased work of breathing on exam.  She had no reproducible tenderness to palpation over the anterior chest wall.  Patient seemed very frustrated on exam with her chronic chest pain and was tearful several times.  Patient states that her chronic chest pain has taken a toll on her mental health.  Patient denies suicidal or homicidal ideation and declined psychiatric evaluation in the emergency department.  Upon recheck, patient continues to feel comfortable following up as an outpatient with her psychiatrist and once again, denied suicidal or homicidal ideation.  Her delta troponin was within range.  TSH and free T4 within range.  I reviewed CTA results from 8/15 which were noncontributory for PE.  Recommended patient continuing her Zoloft until she can be evaluated by psychiatry.  Patient also has follow-up instructions for cardiology and is currently wearing her Holter monitor.  Return precautions were given to return with new or worsening symptoms.  All patient questions were answered.  Clinical Course as of 03/18/22 Reece Agar Mar 18, 2022  1639 Troponin I (High Sensitivity) [JW]    Clinical Course User Index [JW] Orvil Feil, PA-C     FINAL CLINICAL IMPRESSION(S) / ED DIAGNOSES   Final diagnoses:  Other chest pain     Rx / DC Orders   ED Discharge Orders     None         Note:  This document was prepared using Dragon voice recognition software and may include unintentional dictation errors.   Pia Mau Redway, Cordelia Poche 03/18/22 Boyd Kerbs, MD 03/19/22 2219

## 2022-03-18 NOTE — Telephone Encounter (Signed)
Thanks. I see that she is currently at ED. Will wait hear any update.

## 2022-03-18 NOTE — ED Triage Notes (Signed)
Says chest pain  --sharp and pressure on and off and palpitaiotn for 8 months.  Tearful and says it is taking toll on her mental health.  Has a monitor on now.  Says it happens every day.

## 2022-03-19 ENCOUNTER — Emergency Department
Admission: EM | Admit: 2022-03-19 | Discharge: 2022-03-19 | Disposition: A | Discharge status: Home / Self Care | Payer: Medicaid Other | Attending: Emergency Medicine | Admitting: Emergency Medicine

## 2022-03-19 ENCOUNTER — Other Ambulatory Visit: Payer: Self-pay | Admitting: Psychiatry

## 2022-03-19 ENCOUNTER — Other Ambulatory Visit: Payer: Self-pay

## 2022-03-19 ENCOUNTER — Telehealth: Payer: Self-pay | Admitting: Psychiatry

## 2022-03-19 DIAGNOSIS — F419 Anxiety disorder, unspecified: Secondary | ICD-10-CM | POA: Insufficient documentation

## 2022-03-19 DIAGNOSIS — F17210 Nicotine dependence, cigarettes, uncomplicated: Secondary | ICD-10-CM | POA: Diagnosis not present

## 2022-03-19 DIAGNOSIS — Z046 Encounter for general psychiatric examination, requested by authority: Secondary | ICD-10-CM | POA: Diagnosis present

## 2022-03-19 DIAGNOSIS — Y900 Blood alcohol level of less than 20 mg/100 ml: Secondary | ICD-10-CM | POA: Diagnosis not present

## 2022-03-19 DIAGNOSIS — F32A Depression, unspecified: Secondary | ICD-10-CM | POA: Diagnosis present

## 2022-03-19 LAB — URINE DRUG SCREEN, QUALITATIVE (ARMC ONLY)
Amphetamines, Ur Screen: NOT DETECTED
Barbiturates, Ur Screen: NOT DETECTED
Benzodiazepine, Ur Scrn: NOT DETECTED
Cannabinoid 50 Ng, Ur ~~LOC~~: NOT DETECTED
Cocaine Metabolite,Ur ~~LOC~~: NOT DETECTED
MDMA (Ecstasy)Ur Screen: NOT DETECTED
Methadone Scn, Ur: NOT DETECTED
Opiate, Ur Screen: NOT DETECTED
Phencyclidine (PCP) Ur S: NOT DETECTED
Tricyclic, Ur Screen: NOT DETECTED

## 2022-03-19 LAB — CBC
HCT: 42.6 % (ref 36.0–46.0)
Hemoglobin: 14.3 g/dL (ref 12.0–15.0)
MCH: 33.7 pg (ref 26.0–34.0)
MCHC: 33.6 g/dL (ref 30.0–36.0)
MCV: 100.5 fL — ABNORMAL HIGH (ref 80.0–100.0)
Platelets: 288 10*3/uL (ref 150–400)
RBC: 4.24 MIL/uL (ref 3.87–5.11)
RDW: 11.9 % (ref 11.5–15.5)
WBC: 6.8 10*3/uL (ref 4.0–10.5)
nRBC: 0 % (ref 0.0–0.2)

## 2022-03-19 LAB — COMPREHENSIVE METABOLIC PANEL
ALT: 17 U/L (ref 0–44)
AST: 17 U/L (ref 15–41)
Albumin: 4.4 g/dL (ref 3.5–5.0)
Alkaline Phosphatase: 55 U/L (ref 38–126)
Anion gap: 8 (ref 5–15)
BUN: 8 mg/dL (ref 6–20)
CO2: 29 mmol/L (ref 22–32)
Calcium: 9.6 mg/dL (ref 8.9–10.3)
Chloride: 103 mmol/L (ref 98–111)
Creatinine, Ser: 0.96 mg/dL (ref 0.44–1.00)
GFR, Estimated: >60 mL/min (ref >60)
Glucose, Bld: 126 mg/dL — ABNORMAL HIGH (ref 70–99)
Potassium: 3.6 mmol/L (ref 3.5–5.1)
Sodium: 140 mmol/L (ref 135–145)
Total Bilirubin: 0.6 mg/dL (ref 0.3–1.2)
Total Protein: 8 g/dL (ref 6.5–8.1)

## 2022-03-19 LAB — ETHANOL: Alcohol, Ethyl (B): <10 mg/dL (ref <10)

## 2022-03-19 LAB — POC URINE PREG, ED: Preg Test, Ur: NEGATIVE

## 2022-03-19 LAB — TROPONIN I (HIGH SENSITIVITY): Troponin I (High Sensitivity): 3 ng/L (ref <18)

## 2022-03-19 LAB — SALICYLATE LEVEL: Salicylate Lvl: <7 mg/dL — ABNORMAL LOW (ref 7.0–30.0)

## 2022-03-19 LAB — ACETAMINOPHEN LEVEL: Acetaminophen (Tylenol), Serum: <10 ug/mL — ABNORMAL LOW (ref 10–30)

## 2022-03-19 MED ORDER — QUETIAPINE FUMARATE 25 MG PO TABS: 25.0000 mg | ORAL_TABLET | Freq: Every day | ORAL | 0 refills | Status: DC

## 2022-03-19 NOTE — ED Notes (Signed)
Speaking with psych team

## 2022-03-19 NOTE — ED Notes (Signed)
Pt belongings:  1 yellow shirt 1 pair of black and pink shorts 1 pair of black sandals  1 brown purse 1 lip ring, wedding ring, a yellow scrunchie

## 2022-03-19 NOTE — ED Triage Notes (Signed)
First Nurse Note:  Arrives today with father stating she was seen yesterday through ED and was "told to return today for a psych evaluation".  States she spoke with her psychiatrist today who agreed and thought it would be a good idea to be seen.  Reviewing chart from yesterday, the plan was for patient to follow up outpatient is psychiatry.  AAOx3.  Skin warm and dry.  Denies SI/HI.  Mood is upbeat, calm and cooperative.  NAD

## 2022-03-19 NOTE — ED Triage Notes (Signed)
Pt here with a mental health problem. Pt is dealing with several medical issues and states that they are weighing on her mental and she needs help. Pt also states her anxiety is getting worse.

## 2022-03-19 NOTE — ED Notes (Signed)
Verified correct patient and correct discharge papers given. Pt alert and oriented X 4, stable for discharge. RR even and unlabored, color WNL. Discussed discharge instructions and follow-up as directed. Discharge medications discussed, when prescribed. Pt had opportunity to ask questions, and RN available to provide patient and/or family education. Left with all of belongings. 

## 2022-03-19 NOTE — Telephone Encounter (Signed)
Discussed with the patient.  She states that she is in a "bad spot" mentally.  She has severe anxiety and has panic attacks.  She cannot function well.  She is planning to go to the ED or [facility] due to this.  She denies any SI/HI.  She denies alcohol use or drug use.  She has started sertraline.  She is not on the Abilify anymore due to drowsiness. She is concerned of starting benzodiazepine due to her being on Suboxone. Patient is informed that she will likely benefit from outpatient care as she is not currently at imminent danger to self or others, although she is advised to go there if she thinks it is needed. She agrees with the following plans (she states that she is still planning to go there).  - Continue sertraline with uptitration as directed at the previous visit.  - Start quetiapine 25 mg at night.  She is advised to take it as prn for anxiety if this causes drowsiness in the next morning - Next appointment: 9/29 at 8 AM for video.  -   Discussed the following, and she has agreed with the plans.

## 2022-03-19 NOTE — ED Provider Notes (Signed)
[facility] Provider Note    Event Date/Time   First MD Initiated Contact with Patient 03/19/22 1108     (approximate)   History   Mental Health Problem   HPI  Kendra [name] is a 31 y.o. female who reports she has been having multiple medical problems and these have really played with her nurse and now she is a nervous wreck.  She is asking to see psychiatry.  She is not currently having any chest tightness or discomfort although when she takes a deep breath occasionally she will have some pain.  She has an event monitor on.  Reviewing her old records she has had labs multiple times and in fact she was here yesterday had negative troponins.  She had a CT angio on the 15th of this month which was negative.     Physical Exam   Triage Vital Signs: ED Triage Vitals  Enc Vitals Group     BP 03/19/22 1042 (!) 120/95     Pulse Rate 03/19/22 1040 99     Resp 03/19/22 1040 16     Temp 03/19/22 1040 98.1 F (36.7 C)     Temp Source 03/19/22 1040 Oral     SpO2 03/19/22 1040 97 %     Weight 03/19/22 1043 220 lb (99.8 kg)     Height 03/19/22 1043 5\' 7"  (1.702 m)     Head Circumference --      Peak Flow --      Pain Score 03/19/22 1042 7     Pain Loc --      Pain Edu? --      Excl. in GC? --     Most recent vital signs: Vitals:   03/19/22 1040 03/19/22 1042  BP:  (!) 120/95  Pulse: 99   Resp: 16   Temp: 98.1 F (36.7 C)   SpO2: 97%      General: Awake, alert little anxious looking Mouth: No erythema or exudate CV:  Good peripheral perfusion.  Regular rate and rhythm no audible murmurs Chest: Nontender Resp:  Normal effort.  Lungs are clear Abd:  No distention.  Extremities: No edema   ED Results / Procedures / Treatments   Labs (all labs ordered are listed, but only abnormal results are displayed) Labs Reviewed  COMPREHENSIVE METABOLIC PANEL - Abnormal; Notable for the following components:      Result Value   Glucose, Bld 126 (*)     All other components within normal limits  SALICYLATE LEVEL - Abnormal; Notable for the following components:   Salicylate Lvl <7.0 (*)    All other components within normal limits  ACETAMINOPHEN LEVEL - Abnormal; Notable for the following components:   Acetaminophen (Tylenol), Serum <10 (*)    All other components within normal limits  CBC - Abnormal; Notable for the following components:   MCV 100.5 (*)    All other components within normal limits  ETHANOL  URINE DRUG SCREEN, QUALITATIVE ([facility] ONLY)  POC URINE PREG, ED  TROPONIN I (HIGH SENSITIVITY)     EKG  EKG read and interpreted by me shows normal sinus rhythm rate of 95 normal axis no acute ST-T wave changes similar to EKG from yesterday.  Chest x-ray was read as negative on the first of the month.   RADIOLOGY    PROCEDURES:  Critical Care performed:   Procedures   MEDICATIONS ORDERED IN ED: Medications - No data to display   IMPRESSION / MDM /  ASSESSMENT AND PLAN / ED COURSE  I reviewed the triage vital signs and the nursing notes. Patient seems to be anxious.  Lab work is stable.  I will have psych see her if she wishes.  Patient's presentation is most consistent with acute complicated illness / injury requiring diagnostic workup.    FINAL CLINICAL IMPRESSION(S) / ED DIAGNOSES   Final diagnoses:  Anxiety     Rx / DC Orders   ED Discharge Orders     None        Note:  This document was prepared using Dragon voice recognition software and may include unintentional dictation errors.   Arnaldo Natal, MD 03/19/22 [phone]

## 2022-03-19 NOTE — BH Assessment (Signed)
Comprehensive Clinical Assessment (CCA) Screening, Triage and Referral Note  03/19/2022 Kendra [name] [id number]  Kendra [name], 31 year old female who presents to [facility] ED voluntarily for treatment. Per triage note, First Nurse Note: Arrives today with father stating she was seen yesterday through ED and was "told to return today for a psych evaluation". States she spoke with her psychiatrist today who agreed and thought it would be a good idea to be seen.  Reviewing chart from yesterday, the plan was for patient to follow up outpatient is psychiatry. AAOx3.  Skin warm and dry.  Denies SI/HI.  Mood is upbeat, calm and cooperative.  NAD   During TTS assessment pt presents alert and oriented x 4, restless but cooperative, and mood-congruent with affect. The pt does not appear to be responding to internal or external stimuli. Neither is the pt presenting with any delusional thinking. Pt verified the information provided to triage RN.   Pt identifies her main complaint to be that she is having panic attacks and not able to manage her anxiety. Patient describes her attacks as pain in her chest, back and shoulders. Patient reports she can't seem to calm herself down. Patient reports being followed by a psychiatrist, whom she spoke with yesterday. Patient states Dr. Ulice Brilliant prescribed Seroquel but has yet to have it filled. Patient reports she is reluctant to adjust her meds due to her also taking Suboxone and not wanting any adverse reactions to take place. Patient reports she was taking Abilify but stopped because it was making her feel like a zombie. 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 Patient presented to the ED groomed and appropriately dressed. Patient denies using any illicit substances and alcohol. 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 Pt denies current SI/HI/AH/VH. "I do not want to die but I do not want to live like  this."    Pt provided husband, Kendra [name] as a collateral contact. Kendra [name] reports patient has progressively gotten worse over the past 6 months. Kendra [name] states patient does not want to be alone and wants him to stay home with her. "She does not have a life, but I have to work so I can provide." Kendra [name] believes medication will work if patient will listen to provider and take medicine.    Per [name], NP, pt is recommended to follow up with psychiatrist for outpatient treatment. Patient does not meet criteria for inpatient psychiatric admission.     Chief Complaint:  Chief Complaint  Patient presents with   Mental Health Problem   Visit Diagnosis: Anxiety  Patient Reported Information How did you hear about [place]? Family/Friend  What Is the Reason for Your Visit/Call Today? Patient was brought to the ED by her father due to increased anxiety.  How Long Has This Been Causing You Problems? > than 6 months  What Do You Feel Would Help You the Most Today? Treatment for Depression or other mood problem; Stress Management   Have You Recently Had Any Thoughts About Hurting Yourself? No  Are You Planning to Commit Suicide/Harm Yourself At This time? No   Have you Recently Had Thoughts About Hurting Someone Kendra [name]? No  Are You Planning to Harm Someone at This Time? No  Explanation: No data recorded  Have You Used Any Alcohol or Drugs in the Past 24 Hours? No  How Long Ago Did You Use Drugs or Alcohol? No data  recorded What Did You Use and How Much? No data recorded  Do You Currently Have a Therapist/Psychiatrist? Yes  Name of Therapist/Psychiatrist: Dr. Ulice Brilliant   Have You Been Recently Discharged From Any Office Practice or Programs? No  Explanation of Discharge From Practice/Program: No data recorded   CCA Screening Triage Referral Assessment Type of Contact: Face-to-Face  Telemedicine Service Delivery:   Is this Initial or Reassessment? No data recorded Date Telepsych consult  ordered in [facility]:  No data recorded Time Telepsych consult ordered in [facility]:  No data recorded Location of Assessment: [facility] ED  Provider Location: [facility] ED   Collateral Involvement: Husband- Kendra [name]   Does Patient Have a Automotive engineer Guardian? No data recorded Name and Contact of Legal Guardian: No data recorded If Minor and Not Living with Parent(s), Who has Custody? n/a  Is CPS involved or ever been involved? Never  Is APS involved or ever been involved? Never   Patient Determined To Be At Risk for Harm To Self or Others Based on Review of Patient Reported Information or Presenting Complaint? No  Method: No data recorded Availability of Means: No data recorded Intent: No data recorded Notification Required: No data recorded Additional Information for Danger to Others Potential: No data recorded Additional Comments for Danger to Others Potential: No data recorded Are There Guns or Other Weapons in Your Home? No data recorded Types of Guns/Weapons: No data recorded Are These Weapons Safely Secured?                            No data recorded Who Could Verify You Are Able To Have These Secured: No data recorded Do You Have any Outstanding Charges, Pending Court Dates, Parole/Probation? No data recorded Contacted To Inform of Risk of Harm To Self or Others: No data recorded  Does Patient Present under Involuntary Commitment? No  IVC Papers Initial File Date: No data recorded  [place] of Residence: [place]   Patient Currently Receiving the Following Services: Medication Management; Individual Therapy   Determination of Need: Emergent (2 hours)   Options For Referral: ED Visit; Medication Management; Outpatient Therapy   Discharge Disposition:     Kendra [name], Counselor, LCAS-A

## 2022-03-19 NOTE — ED Notes (Signed)
Lunch provided.

## 2022-03-19 NOTE — ED Notes (Signed)
VOL/  PENDING  CONSULT 

## 2022-03-19 NOTE — Telephone Encounter (Signed)
Patient calling back after discharge from [facility] ED. Still not sure if she needs to have voluntary admittance due to mental status. Thinking she is going back today for that. Wanting to speak to you regarding this. Please advise. Also let her know she can go to [facility] in [place] for evaluation.

## 2022-03-19 NOTE — ED Notes (Signed)
Psych team updating patient on recommended disposition.

## 2022-03-19 NOTE — Consult Note (Signed)
[facility] Face-to-Face Psychiatry Consult   Reason for Consult: Anxiety Referring Physician: Juliette Alcide Patient Identification: Kendra [name] MRN:  [id number] Principal Diagnosis: Anxiety and depression Diagnosis:  Principal Problem:   Anxiety and depression   Total Time spent with patient: 45 minutes  Subjective: "I am not suicidal; I am anxious"  Kendra [name] is a 31 y.o. female patient admitted with anxiety.  HPI:  Patient presents to the ED with anxiety related to believing that there is something medically wrong with her because she has pain in many areas of her body that "no one can find an answer to." She states she has been to the ED many times over the last 6 months and is being tested for several different medical problems. She is currently wearing a portable device on her chest that she has to keep on for 2 more weeks. She sees an outpatient psychiatrist, Dr. Vanetta Shawl, who she spoke with today. Per Dr. Bing Matter chart note, she was advised to start Seroquel and increase sertraline.  Patient was taking Abilify, but stopped taking it because it made her feel tired all the time.  Patient tells me that  she has not done that, as she is always concerned of "having a reaction." She tells me, as she did Dr. Vanetta Shawl, that she is afraid of benzodiazepines because she is on Suboxone and is afraid it will compromise her breathing.  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 Patient states that she has always been anxious, but her physical health has made it worse. She denies thought or intent of suicide. Denies homicidal ideation, paranoia, auditory or visual hallucinations.  Patient reports that she has never had a suicide attempt.  She does have a history of nonsuicidal self-injurious behavior, but has not done that in many years.  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  Patient says that she is able to take care of her when she does not feel  that she is able to do a lot of things around the home that she usually does and feels that she always needs to be with somebody because she feels anxious.  Patient is speaking in clear, linear sentences.  Makes good eye contact.  Alert and oriented x 4.  She appears somewhat anxious, but is lucid.  She is neat, well-groomed.   Discussed with patient that she needs to follow medication recommendations of her outpatient psychiatrist, as she has not tried the Seroquel or increased dose of sertraline to see if they are effective. Discussed the importance of weighing the possibility of medication reactions against the way she is currently feeling. Patient agreed that she would get the prescription for Seroquel filled and increase the sertaline as Dr. Vanetta Shawl prescribed. She verbalizes that she feels safe for discharge and will return if she has thoughts of suicide.         Collateral from husband, [name], [phone]: States can't stop focusing on different pains. This has been going on  for 6 months. She keeps looking stuff up on the internet, saying  "I think I have meningitis, blood clots, etc. Says she can't be alone. Labile moods. Husband says she doesn't take the advice of doctors because she is afraid of having a reaction to meds so it isn't helping.  Husband states that he has no concerns about her safety.  Discussed locking up objects that might be harmful, including firearms.  Past Psychiatric History: Anxiety  and depression.  No suicide attempts.  No psychiatric hospitalizations  Risk to Self:   Risk to Others:   Prior Inpatient Therapy:   Prior Outpatient Therapy:    Past Medical History:  Past Medical History:  Diagnosis Date   Anxiety    Depression    GERD (gastroesophageal reflux disease)    Headache    stress   Heart murmur    History of cannabis abuse    Seizures (HCC)    age 78. syncopal episode. Hit head. had seizure.    Tobacco abuse    Vitamin D deficiency      Past Surgical History:  Procedure Laterality Date   COLON SURGERY  2013   pt states "colonoscopy while awake"   COLONOSCOPY WITH PROPOFOL N/A 12/14/2015   Procedure: COLONOSCOPY WITH PROPOFOL;  Surgeon: Midge Minium, MD;  Location[facility];  Service: Endoscopy;  Laterality: N/A;   ESOPHAGOGASTRODUODENOSCOPY (EGD) WITH PROPOFOL N/A 12/14/2015   Procedure: ESOPHAGOGASTRODUODENOSCOPY (EGD) WITH PROPOFOL;  Surgeon: Midge Minium, MD;  Location: [facility];  Service: Endoscopy;  Laterality: N/A;   ESOPHAGOGASTRODUODENOSCOPY (EGD) WITH PROPOFOL N/A 02/21/2022   Procedure: ESOPHAGOGASTRODUODENOSCOPY (EGD) WITH BIOPSY;  Surgeon: Toney Reil, MD;  Location: [facility];  Service: Endoscopy;  Laterality: N/A;   LAPAROSCOPIC TUBAL LIGATION Bilateral 12/03/2021   Procedure: LAPAROSCOPIC TUBAL LIGATION;  Surgeon: Linzie Collin, MD;  Location: [facility];  Service: Gynecology;  Laterality: Bilateral;   WISDOM TOOTH EXTRACTION  2013   Family History:  Family History  Problem Relation Age of Onset   Depression Mother    Diabetes Maternal Grandfather    Congestive Heart Failure Maternal Grandfather    Diverticulitis Maternal Grandmother    Dementia Maternal Grandmother    Family Psychiatric  History:  Social History:  Social History   Substance and Sexual Activity  Alcohol Use No     Social History   Substance and Sexual Activity  Drug Use Not Currently   Types: Marijuana    Social History   Socioeconomic History   Marital status: Married    Spouse name: Not on file   Number of children: Not on file   Years of education: Not on file   Highest education level: Not on file  Occupational History   Not on file  Tobacco Use   Smoking status: Every Day    Packs/day: 1.00    Years: 8.00    Total pack years: 8.00    Types: Cigarettes   Smokeless tobacco: Never  Vaping Use   Vaping Use: Never used  Substance and Sexual Activity   Alcohol use: No   Drug  use: Not Currently    Types: Marijuana   Sexual activity: Yes    Birth control/protection: Surgical    Comment: BTL  Other Topics Concern   Not on file  Social History Narrative   Not on file   Social Determinants of Health   Financial Resource Strain: Low Risk  (12/11/2021)   Overall Financial Resource Strain (CARDIA)    Difficulty of Paying Living Expenses: Not very hard  Food Insecurity: No Food Insecurity (10/11/2021)   Hunger Vital Sign    Worried About Running Out of Food in the Last Year: Never true    Ran Out of Food in the Last Year: Never true  Transportation Needs: No Transportation Needs (09/13/2021)   PRAPARE - Administrator, Civil Service (Medical): No    Lack of Transportation (Non-Medical): No  Physical  Activity: Sufficiently Active (12/11/2021)   Exercise Vital Sign    Days of Exercise per Week: 5 days    Minutes of Exercise per Session: 60 min  Stress: No Stress Concern Present (11/07/2021)   [employer] of Occupational Health - Occupational Stress Questionnaire    Feeling of Stress : Only a little  Social Connections: Not on file   Additional Social History:    Allergies:  No Known Allergies  Labs:  Results for orders placed or performed during the hospital encounter of 03/19/22 (from the past 48 hour(s))  Comprehensive metabolic panel     Status: Abnormal   Collection Time: 03/19/22 10:49 AM  Result Value Ref Range   Sodium 140 135 - 145 mmol/L   Potassium 3.6 3.5 - 5.1 mmol/L   Chloride 103 98 - 111 mmol/L   CO2 29 22 - 32 mmol/L   Glucose, Bld 126 (H) 70 - 99 mg/dL    Comment: Glucose reference range applies only to samples taken after fasting for at least 8 hours.   BUN 8 6 - 20 mg/dL   Creatinine, Ser 2.11 0.44 - 1.00 mg/dL   Calcium 9.6 8.9 - 94.1 mg/dL   Total Protein 8.0 6.5 - 8.1 g/dL   Albumin 4.4 3.5 - 5.0 g/dL   AST 17 15 - 41 U/L   ALT 17 0 - 44 U/L   Alkaline Phosphatase 55 38 - 126 U/L   Total Bilirubin 0.6 0.3 -  1.2 mg/dL   GFR, Estimated >74 >08 mL/min    Comment: (NOTE) Calculated using the CKD-EPI Creatinine Equation (2021)    Anion gap 8 5 - 15    Comment: Performed at [facility], [address] [place], [place] [address]  Ethanol     Status: None   Collection Time: 03/19/22 10:49 AM  Result Value Ref Range   Alcohol, Ethyl (B) <10 <10 mg/dL    Comment: (NOTE) Lowest detectable limit for serum alcohol is 10 mg/dL.  For medical purposes only. Performed at [facility], [address] [place], [place] [address]   Salicylate level     Status: Abnormal   Collection Time: 03/19/22 10:49 AM  Result Value Ref Range   Salicylate Lvl <7.0 (L) 7.0 - 30.0 mg/dL    Comment: Performed at [facility], [address] [place], [place] [address]  Acetaminophen level     Status: Abnormal   Collection Time: 03/19/22 10:49 AM  Result Value Ref Range   Acetaminophen (Tylenol), Serum <10 (L) 10 - 30 ug/mL    Comment: (NOTE) Therapeutic concentrations vary significantly. A range of 10-30 ug/mL  may be an effective concentration for many patients. However, some  are best treated at concentrations outside of this range. Acetaminophen concentrations >150 ug/mL at 4 hours after ingestion  and >50 ug/mL at 12 hours after ingestion are often associated with  toxic reactions.  Performed at [facility], [address] [place], [place] [address]   cbc     Status: Abnormal   Collection Time: 03/19/22 10:49 AM  Result Value Ref Range   WBC 6.8 4.0 - 10.5 K/uL   RBC 4.24 3.87 - 5.11 MIL/uL   Hemoglobin 14.3 12.0 - 15.0 g/dL   HCT 88.5 02.7 - 74.1 %   MCV 100.5 (H) 80.0 - 100.0 fL   MCH 33.7 26.0 - 34.0 pg   MCHC 33.6 30.0 - 36.0 g/dL   RDW 28.7 86.7 - 67.2 %  Platelets 288 150 - 400 K/uL   nRBC 0.0 0.0 - 0.2 %    Comment: Performed at [facility], [address] [place], [place] [address]  Urine Drug Screen, Qualitative     Status: None    Collection Time: 03/19/22 10:49 AM  Result Value Ref Range   Tricyclic, Ur Screen NONE DETECTED NONE DETECTED   Amphetamines, Ur Screen NONE DETECTED NONE DETECTED   MDMA (Ecstasy)Ur Screen NONE DETECTED NONE DETECTED   Cocaine Metabolite,Ur [place] NONE DETECTED NONE DETECTED   Opiate, Ur Screen NONE DETECTED NONE DETECTED   Phencyclidine (PCP) Ur S NONE DETECTED NONE DETECTED   Cannabinoid 50 Ng, Ur [place] NONE DETECTED NONE DETECTED   Barbiturates, Ur Screen NONE DETECTED NONE DETECTED   Benzodiazepine, Ur Scrn NONE DETECTED NONE DETECTED   Methadone Scn, Ur NONE DETECTED NONE DETECTED    Comment: (NOTE) Tricyclics + metabolites, urine    Cutoff 1000 ng/mL Amphetamines + metabolites, urine  Cutoff 1000 ng/mL MDMA (Ecstasy), urine              Cutoff 500 ng/mL Cocaine Metabolite, urine          Cutoff 300 ng/mL Opiate + metabolites, urine        Cutoff 300 ng/mL Phencyclidine (PCP), urine         Cutoff 25 ng/mL Cannabinoid, urine                 Cutoff 50 ng/mL Barbiturates + metabolites, urine  Cutoff 200 ng/mL Benzodiazepine, urine              Cutoff 200 ng/mL Methadone, urine                   Cutoff 300 ng/mL  The urine drug screen provides only a preliminary, unconfirmed analytical test result and should not be used for non-medical purposes. Clinical consideration and professional judgment should be applied to any positive drug screen result due to possible interfering substances. A more specific alternate chemical method must be used in order to obtain a confirmed analytical result. Gas chromatography / mass spectrometry (GC/MS) is the preferred confirm atory method. Performed at [facility], [address] [place], [place] [address]   Troponin I (High Sensitivity)     Status: None   Collection Time: 03/19/22 10:49 AM  Result Value Ref Range   Troponin I (High Sensitivity) 3 <18 ng/L    Comment: (NOTE) Elevated high sensitivity troponin I (hsTnI) values and  significant  changes across serial measurements may suggest ACS but many other  chronic and acute conditions are known to elevate hsTnI results.  Refer to the "Links" section for chest pain algorithms and additional  guidance. Performed at [facility], [address] [place], [place] [address]   POC urine preg, ED     Status: None   Collection Time: 03/19/22 11:07 AM  Result Value Ref Range   Preg Test, Ur NEGATIVE NEGATIVE    Comment:        THE SENSITIVITY OF THIS METHODOLOGY IS >24 mIU/mL     No current facility-administered medications for this encounter.   Current Outpatient Medications  Medication Sig Dispense Refill   buprenorphine-naloxone (SUBOXONE) 8-2 mg SUBL SL tablet Place 1 tablet under the tongue daily. Patient uses 8mg  film bid     meclizine (ANTIVERT) 25 MG tablet Take 1 tablet (25 mg total) by mouth 3 (three) times daily as needed for dizziness. 30 tablet 0  meloxicam (MOBIC) 15 MG tablet Take 15 mg by mouth daily as needed.     methocarbamol (ROBAXIN) 500 MG tablet Take by mouth.     pantoprazole (PROTONIX) 40 MG tablet Take 1 tablet by mouth daily.     QUEtiapine (SEROQUEL) 25 MG tablet Take 1 tablet (25 mg total) by mouth at bedtime. 30 tablet 0   sertraline (ZOLOFT) 50 MG tablet Take 100 mg by mouth daily.      Musculoskeletal: Strength & Muscle Tone: within normal limits Gait & Station: normal Patient leans: N/A            Psychiatric Specialty Exam:  Presentation  General Appearance: Appropriate for Environment  Eye Contact:Good  Speech:Clear and Coherent  Speech Volume:Normal  Handedness:No data recorded  Mood and Affect  Mood:Anxious  Affect:Appropriate   Thought Process  Thought Processes:Coherent  Descriptions of Associations:Intact  Orientation:Full (Time, Place and Person)  Thought Content:Logical  History of Schizophrenia/Schizoaffective disorder:No data recorded Duration of Psychotic Symptoms:No  data recorded Hallucinations:Hallucinations: None  Ideas of Reference:None  Suicidal Thoughts:Suicidal Thoughts: No  Homicidal Thoughts:Homicidal Thoughts: No   Sensorium  Memory:Immediate Good  Judgment:Fair  Insight:Fair   Executive Functions  Concentration:Good  Attention Span:Good  Recall:Good  Fund of Knowledge:Good  Language:Good   Psychomotor Activity  Psychomotor Activity:Psychomotor Activity: Normal   Assets  Assets:Communication Skills; Desire for Improvement; Financial Resources/Insurance; Housing; Physical Health; Resilience; Social Support   Sleep  Sleep:Sleep: Fair   Physical Exam: Physical Exam ROS Blood pressure 112/72, pulse 79, temperature 98.4 F (36.9 C), temperature source Oral, resp. rate 18, height 5\' 7"  (1.702 m), weight 99.8 kg, last menstrual period 03/12/2022, SpO2 99 %. Body mass index is 34.46 kg/m.  Treatment Plan Summary: Plan he needs to fill prescriptions that were called in by her psychiatrist today.  She will follow with her psychiatrist and medical outpatient providers.  Reviewed with [facility]  Disposition: Patient does not meet criteria for psychiatric inpatient admission. Supportive therapy provided about ongoing stressors. Discussed crisis plan, support from social network, calling 911, coming to the Emergency Department, and calling Suicide Hotline.  03/14/2022, NP 03/19/2022 5:28 PM

## 2022-03-19 NOTE — Discharge Instructions (Signed)
Please continue to follow-up with your doctor.  Please continue your medications.  Please return if you are worse.

## 2022-03-20 ENCOUNTER — Telehealth: Payer: Medicaid Other | Admitting: Obstetrics and Gynecology

## 2022-03-20 ENCOUNTER — Telehealth: Payer: Self-pay | Admitting: Licensed Clinical Social Worker

## 2022-03-20 ENCOUNTER — Telehealth: Payer: Self-pay | Admitting: Obstetrics and Gynecology

## 2022-03-20 NOTE — Telephone Encounter (Signed)
Patient called and states that she was unaware of how to use the video visit properly. Informed patient that she has to actually click the video icon in [facility], it is not a call that comes to her phone. Patient is now aware of how to use and have been rescheduled. Patient states that she was told she would need medication as one of her labs came back abnormal/positive. Patient is wondering if she could receive the RX now or if she is to wait until her next video visit  with provider. Patient states she was never told a name of the medication so was not sure when I asked what medication she is referring to. Please advise.

## 2022-03-21 NOTE — Patient Outreach (Signed)
  Medicaid Managed Care   Unsuccessful Attempt Note   03/21/2022 Name: MARYIAH OLVEY MRN: [id number] DOB: 1990/09/05  Referred by: [name], PA Reason for referral : Transitions Of Care   An unsuccessful telephone outreach was attempted today. The patient was referred to the case management team for assistance with care management and care coordination.    Follow Up Plan: A HIPAA compliant phone message was left for the patient providing contact information and requesting a return call.   Dickie La, BSW, MSW, [employer] Managed Medicaid LCSW [facility]  [facility] [email or web address] Phone: [phone]

## 2022-03-25 NOTE — Progress Notes (Unsigned)
Virtual Visit via Video Note  I connected with Kendra [name] on 03/26/22 at  8:00 AM EDT by a video enabled telemedicine application and verified that I am speaking with the correct person using two identifiers.  Location: Patient: home Provider: office Persons participated in the visit- patient, provider    I discussed the limitations of evaluation and management by telemedicine and the availability of in person appointments. The patient expressed understanding and agreed to proceed.    I discussed the assessment and treatment plan with the patient. The patient was provided an opportunity to ask questions and all were answered. The patient agreed with the plan and demonstrated an understanding of the instructions.   The patient was advised to call back or seek an in-person evaluation if the symptoms worsen or if the condition fails to improve as anticipated.  I provided 17 minutes of non-face-to-face time during this encounter.   Neysa Hotter, MD    [facility] MD/PA/NP OP Progress Note  03/26/2022 8:31 AM Kendra [name]  MRN:  [id number]  Chief Complaint:  Chief Complaint  Patient presents with   Follow-up   Depression   Anxiety   HPI:  This is a follow-up appointment for anxiety and depression.  She states that although she continues to have bad anxiety, it has been better for the past week.  She does not feel anxious as before, and it has been easier to calm down, although she still struggles.  She was able to visit her grandmother, and is planning to see her aunt. She enjoys the time with her family.  She feels bad to her husband, feeling that she is wasting her time due to her symptoms.  She reports great help from him.  She agrees to work on anxiety where she is getting medically evaluated.  She reports significant anxiety of starting a new medication since she had experience of "serotonin syndrome" when she was started on Lexapro while she is still on hydroxyzine, and  tapering off from sertraline.  Although she is concerned about respiratory suppression was combination of Suboxone and benzodiazepine, she would like to have the medication for severe anxiety if possible.  She denies alcohol use or drug use.  She sleeps well.  She feels drowsy for a few hours in the morning after starting quetiapine.  She feels more hungry, although she denies any change in her diet.  Although she reports passive SI of feeling others would feel better if she were to be off, she adamantly denies any intent or plan.  She agrees to contact emergency resources if any worsening.    Daily routine: household chores, gardening, sees her grandmother, helps her daughter to go to school Support: mother, grandmother, father Household: husband, daughter Marital status: married Number of children: 1 daughter, 59 yo Employment: unemployed, used to work in Physiological scientist, Engineer, civil (consulting):  had college experience in [place] Last PCP / ongoing medical evaluation:   Born in [place], grew up in [place] at age [age]. She moved from [place] to [place] to be closer with her grandmother and her father. She reports good childhood, although it was challenging when her parents got divorced when she was 63 year old. She describes her mother as a good mother, and reports great relationship with both of her parents.   Visit Diagnosis:    ICD-10-CM   1. MDD (major depressive disorder), recurrent episode, mild (HCC)  F33.0     2. Anxiety state  F41.1  3. Panic disorder  F41.0       Past Psychiatric History: Please see initial evaluation for full details. I have reviewed the history. No updates at this time.     Past Medical History:  Past Medical History:  Diagnosis Date   Anxiety    Depression    GERD (gastroesophageal reflux disease)    Headache    stress   Heart murmur    History of cannabis abuse    Seizures (HCC)    age 69. syncopal episode. Hit head. had seizure.    Tobacco  abuse    Vitamin D deficiency     Past Surgical History:  Procedure Laterality Date   COLON SURGERY  2013   pt states "colonoscopy while awake"   COLONOSCOPY WITH PROPOFOL N/A 12/14/2015   Procedure: COLONOSCOPY WITH PROPOFOL;  Surgeon: Midge Minium, MD;  Location: [facility];  Service: Endoscopy;  Laterality: N/A;   ESOPHAGOGASTRODUODENOSCOPY (EGD) WITH PROPOFOL N/A 12/14/2015   Procedure: ESOPHAGOGASTRODUODENOSCOPY (EGD) WITH PROPOFOL;  Surgeon: Midge Minium, MD;  Location: [facility];  Service: Endoscopy;  Laterality: N/A;   ESOPHAGOGASTRODUODENOSCOPY (EGD) WITH PROPOFOL N/A 02/21/2022   Procedure: ESOPHAGOGASTRODUODENOSCOPY (EGD) WITH BIOPSY;  Surgeon: Toney Reil, MD;  Location: [facility];  Service: Endoscopy;  Laterality: N/A;   LAPAROSCOPIC TUBAL LIGATION Bilateral 12/03/2021   Procedure: LAPAROSCOPIC TUBAL LIGATION;  Surgeon: Linzie Collin, MD;  Location: [facility];  Service: Gynecology;  Laterality: Bilateral;   WISDOM TOOTH EXTRACTION  2013    Family Psychiatric History: Please see initial evaluation for full details. I have reviewed the history. No updates at this time.     Family History:  Family History  Problem Relation Age of Onset   Depression Mother    Diabetes Maternal Grandfather    Congestive Heart Failure Maternal Grandfather    Diverticulitis Maternal Grandmother    Dementia Maternal Grandmother     Social History:  Social History   Socioeconomic History   Marital status: Married    Spouse name: Not on file   Number of children: Not on file   Years of education: Not on file   Highest education level: Not on file  Occupational History   Not on file  Tobacco Use   Smoking status: Every Day    Packs/day: 1.00    Years: 8.00    Total pack years: 8.00    Types: Cigarettes   Smokeless tobacco: Never  Vaping Use   Vaping Use: Never used  Substance and Sexual Activity   Alcohol use: No   Drug use: Not Currently     Types: Marijuana   Sexual activity: Yes    Birth control/protection: Surgical    Comment: BTL  Other Topics Concern   Not on file  Social History Narrative   Not on file   Social Determinants of Health   Financial Resource Strain: Low Risk  (12/11/2021)   Overall Financial Resource Strain (CARDIA)    Difficulty of Paying Living Expenses: Not very hard  Food Insecurity: No Food Insecurity (10/11/2021)   Hunger Vital Sign    Worried About Running Out of Food in the Last Year: Never true    Ran Out of Food in the Last Year: Never true  Transportation Needs: No Transportation Needs (09/13/2021)   PRAPARE - Administrator, Civil Service (Medical): No    Lack of Transportation (Non-Medical): No  Physical Activity: Sufficiently Active (12/11/2021)   Exercise Vital Sign  Days of Exercise per Week: 5 days    Minutes of Exercise per Session: 60 min  Stress: No Stress Concern Present (11/07/2021)   [employer] of Occupational Health - Occupational Stress Questionnaire    Feeling of Stress : Only a little  Social Connections: Not on file    Allergies: No Known Allergies  Metabolic Disorder Labs: No results found for: "HGBA1C", "MPG" No results found for: "PROLACTIN" No results found for: "CHOL", "TRIG", "HDL", "CHOLHDL", "VLDL", "LDLCALC" Lab Results  Component Value Date   TSH 2.145 03/18/2022   TSH 2.583 09/16/2021    Therapeutic Level Labs: No results found for: "LITHIUM" No results found for: "VALPROATE" No results found for: "CBMZ"  Current Medications: Current Outpatient Medications  Medication Sig Dispense Refill   LORazepam (ATIVAN) 0.5 MG tablet Take 1 tablet (0.5 mg total) by mouth daily as needed for anxiety. 30 tablet 0   buprenorphine-naloxone (SUBOXONE) 8-2 mg SUBL SL tablet Place 1 tablet under the tongue daily. Patient uses 8mg  film bid     meclizine (ANTIVERT) 25 MG tablet Take 1 tablet (25 mg total) by mouth 3 (three) times daily as needed for  dizziness. 30 tablet 0   meloxicam (MOBIC) 15 MG tablet Take 15 mg by mouth daily as needed.     methocarbamol (ROBAXIN) 500 MG tablet Take by mouth.     pantoprazole (PROTONIX) 40 MG tablet Take 1 tablet by mouth daily.     QUEtiapine (SEROQUEL) 25 MG tablet Take 1 tablet (25 mg total) by mouth at bedtime. 30 tablet 0   sertraline (ZOLOFT) 100 MG tablet Take 1 tablet (100 mg total) by mouth daily. 30 tablet 0   valACYclovir (VALTREX) 500 MG tablet Take 1 tablet (500 mg total) by mouth daily. Can increase to twice a day for 5 days in the event of a recurrence 30 tablet 12   No current facility-administered medications for this visit.     Musculoskeletal: Strength & Muscle Tone:    /a Gait & Station:  N/A Patient leans: N/A  Psychiatric Specialty Exam: Review of Systems  Psychiatric/Behavioral:  Positive for dysphoric mood and suicidal ideas. Negative for agitation, behavioral problems, confusion, decreased concentration, hallucinations, self-injury and sleep disturbance. The patient is nervous/anxious. The patient is not hyperactive.   All other systems reviewed and are negative.   Last menstrual period 03/12/2022.There is no height or weight on file to calculate BMI.  General Appearance: Fairly Groomed  Eye Contact:  Good  Speech:  Clear and Coherent  Volume:  Normal  Mood:  Anxious  Affect:  Appropriate, Congruent, and fatigue  Thought Process:  Coherent  Orientation:  Full (Time, Place, and Person)  Thought Content: Logical   Suicidal Thoughts:  No  Homicidal Thoughts:  No  Memory:  Immediate;   Good  Judgement:  Good  Insight:  Good  Psychomotor Activity:  Normal  Concentration:  Concentration: Good and Attention Span: Good  Recall:  Good  Fund of Knowledge: Good  Language: Good  Akathisia:  No  Handed:  Right  AIMS (if indicated): not done  Assets:  Communication Skills Desire for Improvement  ADL's:  Intact  Cognition: WNL  Sleep:  Good   Screenings: PHQ2-9     Flowsheet Row Video Visit from 12/05/2021 in [facility] Patient Outreach Telephone from 10/15/2021 in [facility] Care Coordination  PHQ-2 Total Score 1 4  PHQ-9 Total Score -- 10      Flowsheet Row ED from  03/19/2022 in [facility] from 03/18/2022 in [facility] ED from 03/13/2022 in [facility]  C-SSRS RISK CATEGORY No Risk No Risk No Risk        Assessment and Plan:  Kendra [name] is a 31 y.o. year old female with a history of depression, anxiety, history of opioid dependence on Suboxone, seizure, cervical spondylosis, r/o fibromyalgia, who presents for follow up appointment for below.   1. MDD (major depressive disorder), recurrent episode, mild (HCC) 2. Anxiety state 3. Panic disorder She continues to experience anxiety symptoms, and depressive symptoms in relation to her mood and the physical symptoms of chest pain, although it has been a slightly improving since initiation of quetiapine.  Noted that she thinks her somatic symptoms has worsened since she had COVID in January. She denies any significant psychosocial stressors otherwise, and reports great relationship with her daughter, her family, including her parents and grandmother.  Will lower the dose of quetiapine due to adverse reaction of drowsiness in the morning.  Will continue to monitor increase in appetite as well.  Will continue sertraline to target depression and anxiety. Provided psychoeducation about risk of QTc prolongation and serotonin syndrome with concomitant use of suboxone.  Will start lorazepam as needed for severe anxiety; discussed potential risk of respiratory suppression with concomitant use of Suboxone, tolerance and dependence.     # opioid dependence on Suboxone Unchanged. She has been on Suboxone for heroin dependence,  prescribed at [facility].  She has been abstinent for the past 4 years without any craving.  Will continue motivational interview.    Plan Continue 100 mg at night Decrease quetiapine 12.5 mg at night Start lorazepam 0.5 mg daily as needed for anxiety Next appointment: 9/27 at 8 AM for 30 mins, video   I have reviewed suicide assessment in detail. No change in the following assessment.    The patient demonstrates the following risk factors for suicide: Chronic risk factors for suicide include: psychiatric disorder of depression . Acute risk factors for suicide include: N/A. Protective factors for this patient include: positive social support, responsibility to others (children, family), and hope for the future. Considering these factors, the overall suicide risk at this point appears to be low. Patient is appropriate for outpatient follow up.    This clinician has discussed the side effect associated with medication prescribed during this encounter. Please refer to notes in the previous encounters for more details.    I have utilized the [place] Controlled Substances Reporting System (PMP AWARxE) to confirm adherence regarding the patient's medication. My review reveals appropriate prescription fills.      Collaboration of Care: Collaboration of Care: Other reviewed notes in Epic  Patient/Guardian was advised Release of Information must be obtained prior to any record release in order to collaborate their care with an outside provider. Patient/Guardian was advised if they have not already done so to contact the registration department to sign all necessary forms in order for [place] to release information regarding their care.   Consent: Patient/Guardian gives verbal consent for treatment and assignment of benefits for services provided during this visit. Patient/Guardian expressed understanding and agreed to proceed.    Neysa Hotter, MD 03/26/2022, 8:31 AM

## 2022-03-26 ENCOUNTER — Encounter: Payer: Self-pay | Admitting: Obstetrics and Gynecology

## 2022-03-26 ENCOUNTER — Telehealth (INDEPENDENT_AMBULATORY_CARE_PROVIDER_SITE_OTHER): Payer: Medicaid Other | Admitting: Obstetrics and Gynecology

## 2022-03-26 ENCOUNTER — Encounter: Payer: Self-pay | Admitting: Psychiatry

## 2022-03-26 ENCOUNTER — Telehealth (INDEPENDENT_AMBULATORY_CARE_PROVIDER_SITE_OTHER): Payer: Medicaid Other | Admitting: Psychiatry

## 2022-03-26 DIAGNOSIS — A6004 Herpesviral vulvovaginitis: Secondary | ICD-10-CM | POA: Diagnosis not present

## 2022-03-26 DIAGNOSIS — F330 Major depressive disorder, recurrent, mild: Secondary | ICD-10-CM

## 2022-03-26 DIAGNOSIS — F411 Generalized anxiety disorder: Secondary | ICD-10-CM | POA: Diagnosis not present

## 2022-03-26 DIAGNOSIS — F410 Panic disorder [episodic paroxysmal anxiety] without agoraphobia: Secondary | ICD-10-CM

## 2022-03-26 MED ORDER — LORAZEPAM 0.5 MG PO TABS: 0.5000 mg | ORAL_TABLET | Freq: Every day | ORAL | 0 refills | Status: DC | PRN

## 2022-03-26 MED ORDER — SERTRALINE HCL 100 MG PO TABS: 100.0000 mg | ORAL_TABLET | Freq: Every day | ORAL | 0 refills | Status: DC

## 2022-03-26 MED ORDER — VALACYCLOVIR HCL 500 MG PO TABS: 500.0000 mg | ORAL_TABLET | Freq: Every day | ORAL | 12 refills | Status: AC

## 2022-03-26 NOTE — Patient Instructions (Signed)
Continue 100 mg at night Decrease quetiapine 12.5 mg at night Start lorazepam 0.5 mg daily as needed for anxiety Next appointment: 9/27 at 8 AM

## 2022-03-26 NOTE — Progress Notes (Signed)
Virtual Visit via Video Note  I connected with [name] on 03/26/22 at  7:45 AM EDT by video and verified that I was speaking with the correct person using two identifiers.    [name] is a 31 y.o. G2P1011 who LMP was Patient's last menstrual period was 03/12/2022. I discussed the limitations, risks, security and privacy concerns of performing an evaluation and management service by video and the availability of in person appointments. I also discussed with the patient that there may be a patient responsible charge related to this service. The patient expressed understanding and agreed to proceed.  Location of patient:  Home  Patient gave explicit verbal consent for video visit:  YES  Location of provider:  [facility] office  Persons other than physician and patient involved in provider conference:  None   Subjective:   History of Present Illness:    After last visit she noted that she was having recurrent outbreaks of small blisters and she showed me a picture that appeared to be HSV.  HSV antibody testing was performed.  She states that these outbreaks occur every 1 to 2 months and last approximately 5 days.  She says this has been going on for approximately 1 year. Of significant note patient has been seen in the emergency department several times over the last 2 weeks.  She says she is having mental health issues that is very disruptive to her life.  She is seeing a psychiatrist and her care is ongoing.  Hx: The following portions of the patient's history were reviewed and updated as appropriate:             She  has a past medical history of Anxiety, Depression, GERD (gastroesophageal reflux disease), Headache, Heart murmur, History of cannabis abuse, Seizures (HCC), Tobacco abuse, and Vitamin D deficiency. She does not have any pertinent problems on file. She  has a past surgical history that includes Wisdom tooth extraction (2013); Colon surgery (2013); Colonoscopy  with propofol (N/A, 12/14/2015); Esophagogastroduodenoscopy (egd) with propofol (N/A, 12/14/2015); Laparoscopic tubal ligation (Bilateral, 12/03/2021); and Esophagogastroduodenoscopy (egd) with propofol (N/A, 02/21/2022). Her family history includes Congestive Heart Failure in her maternal grandfather; Dementia in her maternal grandmother; Depression in her mother; Diabetes in her maternal grandfather; Diverticulitis in her maternal grandmother. She  reports that she has been smoking cigarettes. She has a 8.00 pack-year smoking history. She has never used smokeless tobacco. She reports that she does not currently use drugs after having used the following drugs: Marijuana. She reports that she does not drink alcohol. She has a current medication list which includes the following prescription(s): buprenorphine-naloxone, meclizine, meloxicam, methocarbamol, pantoprazole, quetiapine, sertraline, and valacyclovir. She has No Known Allergies.       Review of Systems:  Review of Systems  Constitutional: Denied constitutional symptoms, night sweats, recent illness, fatigue, fever, insomnia and weight loss.  Eyes: Denied eye symptoms, eye pain, photophobia, vision change and visual disturbance.  Ears/Nose/Throat/Neck: Denied ear, nose, throat or neck symptoms, hearing loss, nasal discharge, sinus congestion and sore throat.  Cardiovascular: Denied cardiovascular symptoms, arrhythmia, chest pain/pressure, edema, exercise intolerance, orthopnea and palpitations.  Respiratory: Denied pulmonary symptoms, asthma, pleuritic pain, productive sputum, cough, dyspnea and wheezing.  Gastrointestinal: Denied, gastro-esophageal reflux, melena, nausea and vomiting.  Genitourinary: See HPI for additional information.  Musculoskeletal: Denied musculoskeletal symptoms, stiffness, swelling, muscle weakness and myalgia.  Dermatologic: Denied dermatology symptoms, rash and scar.  Neurologic: Denied neurology symptoms, dizziness,  headache, neck pain and syncope.  Psychiatric: See HPI for additional information.  Endocrine: Denied endocrine symptoms including hot flashes and night sweats.   Meds:   Current Outpatient Medications on File Prior to Visit  Medication Sig Dispense Refill   buprenorphine-naloxone (SUBOXONE) 8-2 mg SUBL SL tablet Place 1 tablet under the tongue daily. Patient uses 8mg  film bid     meclizine (ANTIVERT) 25 MG tablet Take 1 tablet (25 mg total) by mouth 3 (three) times daily as needed for dizziness. 30 tablet 0   meloxicam (MOBIC) 15 MG tablet Take 15 mg by mouth daily as needed.     methocarbamol (ROBAXIN) 500 MG tablet Take by mouth.     pantoprazole (PROTONIX) 40 MG tablet Take 1 tablet by mouth daily.     QUEtiapine (SEROQUEL) 25 MG tablet Take 1 tablet (25 mg total) by mouth at bedtime. 30 tablet 0   sertraline (ZOLOFT) 50 MG tablet Take 100 mg by mouth daily.     No current facility-administered medications on file prior to visit.    Assessment:    G2P1011 Patient Active Problem List   Diagnosis Date Noted   Chronic GERD    Gastric erythema    Atypical chest pain 02/05/2022   Frequent patient in emergency department 01/22/2022   Seizure-like activity (HCC) 10/11/2021   Noninfectious diarrhea    Non-intractable cyclical vomiting with nausea    Diarrhea    Anxiety and depression 01/19/2015   History of cannabis dependence/abuse (HCC) 01/19/2015   Tobacco abuse 01/19/2015   Allergic rhinitis 08/04/2007   Attention deficit disorder 04/18/2007     1. 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  Plan:            1.  HSV  I have discussed Herpes in detail with the patient.  Both HSV Type I and II were reviewed.  The sexually transmitted nature of HSV I and II was discussed.  The natural course and history of Herpes was discussed with the patient.  Management of repeat outbreaks, including the use of Anti-viral  agents for prophylaxis, as well as intermittent treatment, was discussed.  The transmission of Herpes was discussed and I informed her that Herpes could be transmitted at any time, but the most likely time of transmission was when the patient had lesions or noticed prodromal symptoms.  Literature regarding Herpes was provided to the patient.  All of her questions have been answered and I believe she has an adequate understanding of Genital Herpes.  Because of the frequency of her outbreaks she has decided to undergo HSV suppression.  Orders No orders of the defined types were placed in this encounter.    Meds ordered this encounter  Medications   valACYclovir (VALTREX) 500 MG tablet    Sig: Take 1 tablet (500 mg total) by mouth daily. Can increase to twice a day for 5 days in the event of a recurrence    Dispense:  30 tablet    Refill:  12      F/U  No follow-ups on file. I spent 19 minutes involved in the care of this patient preparing to see the patient by obtaining and reviewing her medical history (including labs, imaging tests and prior procedures), documenting clinical information in the electronic health record (EHR), counseling and coordinating care plans, writing and sending prescriptions, ordering tests or procedures and in direct communicating with the patient and medical staff discussing pertinent items from her  history and physical exam.   Elonda Husky, M.D. 03/26/2022 7:59 AM

## 2022-03-27 ENCOUNTER — Other Ambulatory Visit: Payer: Self-pay | Admitting: Obstetrics and Gynecology

## 2022-03-27 NOTE — Patient Outreach (Cosign Needed)
Care Coordination  03/27/2022  Kendra [name] 22-Mar-1991 [id number]   Medicaid Managed Care   Unsuccessful Outreach Note  03/27/2022 Name: Kendra [name] MRN: [id number] DOB: Sep 06, 1990  Referred by: [name], PA Reason for referral : High Risk Managed Medicaid (Unsuccessful telephone outreach)   Third unsuccessful telephone outreach was attempted. The patient was referred to the case management team for assistance with care management and care coordination. The patient's primary care provider has been notified of our unsuccessful attempts to make or maintain contact with the patient. The care management team is pleased to engage with this patient at any time in the future should he/she be interested in assistance from the care management team.   Follow Up Plan: We have been unable to make contact with the patient for follow up. The care management team is available to follow up with the patient after provider conversation with the patient regarding recommendation for care management engagement and subsequent re-referral to the care management team.   Kathi Der RN, BSN [place]  [facility] Care Management Coordinator - Managed [place] High Risk [phone]

## 2022-03-27 NOTE — Patient Instructions (Signed)
Visit Information  [name]  - as a part of your Medicaid benefit, you are eligible for care management and care coordination services at no cost or copay. We have unable to reach you by phone but would be happy to help you with your health related needs. Please feel free to call me at [phone].  Kathi Der RN, BSN [place]  [facility] - Managed Medicaid High Risk [phone]

## 2022-03-29 ENCOUNTER — Other Ambulatory Visit: Payer: Self-pay | Admitting: Internal Medicine

## 2022-03-29 DIAGNOSIS — R55 Syncope and collapse: Secondary | ICD-10-CM

## 2022-04-02 ENCOUNTER — Other Ambulatory Visit: Payer: Self-pay

## 2022-04-02 ENCOUNTER — Emergency Department
Admission: EM | Admit: 2022-04-02 | Discharge: 2022-04-02 | Disposition: A | Discharge status: Home / Self Care | Payer: Medicaid Other | Attending: Emergency Medicine | Admitting: Emergency Medicine

## 2022-04-02 ENCOUNTER — Emergency Department: Payer: Medicaid Other

## 2022-04-02 DIAGNOSIS — R0789 Other chest pain: Secondary | ICD-10-CM | POA: Diagnosis not present

## 2022-04-02 DIAGNOSIS — R079 Chest pain, unspecified: Secondary | ICD-10-CM | POA: Diagnosis present

## 2022-04-02 LAB — MAGNESIUM: Magnesium: 2.1 mg/dL (ref 1.7–2.4)

## 2022-04-02 LAB — CBC WITH DIFFERENTIAL/PLATELET
Abs Immature Granulocytes: 0.01 10*3/uL (ref 0.00–0.07)
Basophils Absolute: 0 10*3/uL (ref 0.0–0.1)
Basophils Relative: 0 %
Eosinophils Absolute: 0 10*3/uL (ref 0.0–0.5)
Eosinophils Relative: 1 %
HCT: 40.3 % (ref 36.0–46.0)
Hemoglobin: 13.6 g/dL (ref 12.0–15.0)
Immature Granulocytes: 0 %
Lymphocytes Relative: 23 %
Lymphs Abs: 1.2 10*3/uL (ref 0.7–4.0)
MCH: 33.4 pg (ref 26.0–34.0)
MCHC: 33.7 g/dL (ref 30.0–36.0)
MCV: 99 fL (ref 80.0–100.0)
Monocytes Absolute: 0.4 10*3/uL (ref 0.1–1.0)
Monocytes Relative: 9 %
Neutro Abs: 3.3 10*3/uL (ref 1.7–7.7)
Neutrophils Relative %: 67 %
Platelets: 251 10*3/uL (ref 150–400)
RBC: 4.07 MIL/uL (ref 3.87–5.11)
RDW: 11.9 % (ref 11.5–15.5)
WBC: 4.9 10*3/uL (ref 4.0–10.5)
nRBC: 0 % (ref 0.0–0.2)

## 2022-04-02 LAB — BASIC METABOLIC PANEL
Anion gap: 11 (ref 5–15)
BUN: 11 mg/dL (ref 6–20)
CO2: 26 mmol/L (ref 22–32)
Calcium: 9.5 mg/dL (ref 8.9–10.3)
Chloride: 104 mmol/L (ref 98–111)
Creatinine, Ser: 0.84 mg/dL (ref 0.44–1.00)
GFR, Estimated: >60 mL/min (ref >60)
Glucose, Bld: 98 mg/dL (ref 70–99)
Potassium: 4 mmol/L (ref 3.5–5.1)
Sodium: 141 mmol/L (ref 135–145)

## 2022-04-02 LAB — TROPONIN I (HIGH SENSITIVITY): Troponin I (High Sensitivity): <2 ng/L (ref <18)

## 2022-04-02 NOTE — ED Provider Notes (Signed)
[facility] Provider Note  Patient Contact: 3:38 PM (approximate)   History   Near Syncope (Patient presents with intermittent LEFT sided chest pain and near-syncopal episode that occurred approx. 2 hours ago (around 10 am))   HPI  Kendra [name] is a 31 y.o. female with a history of left-sided chest pain, presents to the emergency department with left-sided chest pain and a prodrome of diaphoresis.  Patient states that she was at her grandmother's house when symptoms occurred and she did not feel particularly stressed out.  She states that she felt lightheaded and thought that she was going to pass out.  She has experienced similar symptoms in the past.  She has been diagnosed with anxiety and currently takes Zoloft and Seroquel.  Patient is currently under the care of cardiology and is wearing a Holter monitor.  She denies current chest tightness or shortness of breath.  No nausea, vomiting, abdominal pain or fever.      Physical Exam   Triage Vital Signs: ED Triage Vitals  Enc Vitals Group     BP 04/02/22 1207 119/74     Pulse Rate 04/02/22 1207 93     Resp 04/02/22 1207 19     Temp 04/02/22 1207 (!) 96.6 F (35.9 C)     Temp Source 04/02/22 1207 Oral     SpO2 04/02/22 1207 100 %     Weight 04/02/22 1207 220 lb (99.8 kg)     Height 04/02/22 1207 5\' 7"  (1.702 m)     Head Circumference --      Peak Flow --      Pain Score 04/02/22 1208 8     Pain Loc --      Pain Edu? --      Excl. in [place]? --     Most recent vital signs: Vitals:   04/02/22 1207  BP: 119/74  Pulse: 93  Resp: 19  Temp: (!) 96.6 F (35.9 C)  SpO2: 100%     General: Alert and in no acute distress. Eyes:  PERRL. EOMI. Head: No acute traumatic findings ENT:      Nose: No congestion/rhinnorhea.      Mouth/Throat: Mucous membranes are moist. Neck: No stridor. No cervical spine tenderness to palpation. Cardiovascular:  Good peripheral perfusion Respiratory: Normal  respiratory effort without tachypnea or retractions. Lungs CTAB. Good air entry to the bases with no decreased or absent breath sounds. Gastrointestinal: Bowel sounds 4 quadrants. Soft and nontender to palpation. No guarding or rigidity. No palpable masses. No distention. No CVA tenderness. Musculoskeletal: Full range of motion to all extremities.  Neurologic:  No gross focal neurologic deficits are appreciated.  Skin:   No rash noted Other:   ED Results / Procedures / Treatments   Labs (all labs ordered are listed, but only abnormal results are displayed) Labs Reviewed  CBC WITH DIFFERENTIAL/PLATELET  BASIC METABOLIC PANEL  MAGNESIUM  TROPONIN I (HIGH SENSITIVITY)  TROPONIN I (HIGH SENSITIVITY)     EKG  Normal sinus rhythm without ST segment elevation or other apparent arrhythmia.   RADIOLOGY  I personally viewed and evaluated these images as part of my medical decision making, as well as reviewing the written report by the radiologist.  ED Provider Interpretation: No pneumothorax, pneumomediastinum or consolidation .    PROCEDURES:  Critical Care performed: No  Procedures   MEDICATIONS ORDERED IN ED: Medications - No data to display   IMPRESSION / MDM / ASSESSMENT AND PLAN / ED  COURSE  I reviewed the triage vital signs and the nursing notes.                              Assessment and plan Chest pain 31 year old female presents to the emergency department with left-sided chest pain.  Patient has a long history of left-sided chest pain and has had multiple work-ups in the past including CTA within the past year.  CBC, BMP and troponin within range.  EKG indicates normal sinus rhythm without ST segment elevation or other apparent arrhythmia.  We discussed anxiety management strategies in the emergency department and patient feels comfortable following up with psychiatry.  Her left-sided chest pain has improved since waiting.   FINAL CLINICAL IMPRESSION(S)  / ED DIAGNOSES   Final diagnoses:  Other chest pain     Rx / DC Orders   ED Discharge Orders     None        Note:  This document was prepared using Dragon voice recognition software and may include unintentional dictation errors.   Pia [place], PA-C 04/02/22 1548    Shaune Pollack, MD 04/02/22 [phone]

## 2022-04-02 NOTE — ED Triage Notes (Signed)
Patient presents with intermittent LEFT sided chest pain and near-syncopal episode that occurred approx. 2 hours ago (around 10 am)

## 2022-04-02 NOTE — ED Notes (Signed)
Pt ambulated to bathroom with steady gait. 

## 2022-04-04 ENCOUNTER — Ambulatory Visit: Payer: Medicaid Other | Admitting: Gastroenterology

## 2022-04-09 ENCOUNTER — Telehealth (HOSPITAL_COMMUNITY): Payer: Self-pay | Admitting: Emergency Medicine

## 2022-04-09 NOTE — Telephone Encounter (Signed)
Reaching out to patient to offer assistance regarding upcoming cardiac imaging study; pt verbalizes understanding of appt date/time, parking situation and where to check in, pre-test NPO status and medications ordered, and verified current allergies; name and call back number provided for further questions should they arise Rockwell Alexandria RN Navigator Cardiac Imaging [facility] [phone] office [phone] cell  Arrival [address] [facility] [address]  Difficult IV Denies claustro Denies metal implants other than fallopian tube clips

## 2022-04-10 ENCOUNTER — Ambulatory Visit: Admission: RE | Admit: 2022-04-10 | Payer: Medicaid Other | Source: Ambulatory Visit

## 2022-04-15 ENCOUNTER — Other Ambulatory Visit: Payer: Self-pay

## 2022-04-15 ENCOUNTER — Emergency Department
Admission: EM | Admit: 2022-04-15 | Discharge: 2022-04-15 | Disposition: A | Discharge status: Home / Self Care | Payer: Medicaid Other | Attending: Emergency Medicine | Admitting: Emergency Medicine

## 2022-04-15 ENCOUNTER — Encounter: Payer: Self-pay | Admitting: Emergency Medicine

## 2022-04-15 ENCOUNTER — Emergency Department: Payer: Medicaid Other

## 2022-04-15 DIAGNOSIS — R0789 Other chest pain: Secondary | ICD-10-CM | POA: Insufficient documentation

## 2022-04-15 DIAGNOSIS — R079 Chest pain, unspecified: Secondary | ICD-10-CM | POA: Diagnosis present

## 2022-04-15 LAB — BASIC METABOLIC PANEL
Anion gap: 8 (ref 5–15)
BUN: 11 mg/dL (ref 6–20)
CO2: 28 mmol/L (ref 22–32)
Calcium: 9.6 mg/dL (ref 8.9–10.3)
Chloride: 104 mmol/L (ref 98–111)
Creatinine, Ser: 0.85 mg/dL (ref 0.44–1.00)
GFR, Estimated: >60 mL/min (ref >60)
Glucose, Bld: 121 mg/dL — ABNORMAL HIGH (ref 70–99)
Potassium: 3.7 mmol/L (ref 3.5–5.1)
Sodium: 140 mmol/L (ref 135–145)

## 2022-04-15 LAB — CBC
HCT: 40.1 % (ref 36.0–46.0)
Hemoglobin: 13.5 g/dL (ref 12.0–15.0)
MCH: 32.9 pg (ref 26.0–34.0)
MCHC: 33.7 g/dL (ref 30.0–36.0)
MCV: 97.8 fL (ref 80.0–100.0)
Platelets: 278 10*3/uL (ref 150–400)
RBC: 4.1 MIL/uL (ref 3.87–5.11)
RDW: 11.6 % (ref 11.5–15.5)
WBC: 5.1 10*3/uL (ref 4.0–10.5)
nRBC: 0 % (ref 0.0–0.2)

## 2022-04-15 LAB — TROPONIN I (HIGH SENSITIVITY): Troponin I (High Sensitivity): <2 ng/L (ref <18)

## 2022-04-15 NOTE — ED Triage Notes (Signed)
Pt complains of chest pain radiates to neck and shoulder that started approx 2 hours ago after eating lunch. Pt states she was having heart palpitations and hr got up to 170 on her home pulse ox. Pt states heart palipations have resolved but chest pain is constant and feels tights.

## 2022-04-15 NOTE — ED Provider Notes (Signed)
[facility] Provider Note    Event Date/Time   First MD Initiated Contact with Patient 04/15/22 [phone]     (approximate)   History   Chief Complaint: Chest Pain   HPI  Kendra [name] is a 31 y.o. female with a history of anxiety, GERD, frequent ED visits for chest pain who comes the ED complaining of left-sided chest pain that started at about 1230 after eating a deli sandwich for lunch.  Symptoms are now resolved.  No associated shortness of breath diaphoresis or vomiting, nonradiating, no recent exertional symptoms.  She clarifies that the neck pain she has been experiencing has been going on for 2 days, not related to today's chest pain episode and she suspects that occurred due to sleeping in a side-lying position without adequate neck support.      Physical Exam   Triage Vital Signs: ED Triage Vitals  Enc Vitals Group     BP 04/15/22 1432 115/74     Pulse Rate 04/15/22 1432 84     Resp 04/15/22 1432 18     Temp 04/15/22 1432 98.3 F (36.8 C)     Temp Source 04/15/22 1432 Oral     SpO2 04/15/22 1432 95 %     Weight 04/15/22 1428 219 lb 12.8 oz (99.7 kg)     Height 04/15/22 1428 5\' 7"  (1.702 m)     Head Circumference --      Peak Flow --      Pain Score 04/15/22 1428 8     Pain Loc --      Pain Edu? --      Excl. in [place]? --     Most recent vital signs: Vitals:   04/15/22 1432  BP: 115/74  Pulse: 84  Resp: 18  Temp: 98.3 F (36.8 C)  SpO2: 95%    General: Awake, no distress.  CV:  Good peripheral perfusion.  Regular rate and rhythm.  Normal distal pulses. Resp:  Normal effort.  Clear to auscultation bilaterally Abd:  No distention.  Other:  No lower extremity edema, no calf tenderness.   ED Results / Procedures / Treatments   Labs (all labs ordered are listed, but only abnormal results are displayed) Labs Reviewed  BASIC METABOLIC PANEL - Abnormal; Notable for the following components:      Result Value   Glucose, Bld  121 (*)    All other components within normal limits  CBC  TROPONIN I (HIGH SENSITIVITY)     EKG Interpreted by me Normal sinus rhythm rate of 70.  Normal axis intervals QRS ST segments and T waves   RADIOLOGY    PROCEDURES:  Procedures   MEDICATIONS ORDERED IN ED: Medications - No data to display   IMPRESSION / MDM / [place] / ED COURSE  I reviewed the triage vital signs and the nursing notes.                               Patient presents with atypical chest pain which was brief and now resolved.  Consistent with multiple previous chest pain presentations.  She is now back to baseline. Considering the patient's symptoms, medical history, and physical examination today, I have low suspicion for ACS, PE, TAD, pneumothorax, carditis, mediastinitis, pneumonia, CHF, or sepsis.  Exam, vitals, EKG, labs are all normal.      FINAL CLINICAL IMPRESSION(S) / ED DIAGNOSES   Final  diagnoses:  Atypical chest pain     Rx / DC Orders   ED Discharge Orders     None        Note:  This document was prepared using Dragon voice recognition software and may include unintentional dictation errors.   Sharman Cheek, MD 04/15/22 [phone]

## 2022-04-16 ENCOUNTER — Telehealth (HOSPITAL_COMMUNITY): Payer: Self-pay | Admitting: *Deleted

## 2022-04-16 NOTE — Telephone Encounter (Signed)
Reaching out to patient to ensure she is prepared for her cardiac MRI tomorrow. She states she feels prepared and will have a ride to tomorrow's appointment.  Gordy Clement RN Navigator Cardiac Imaging [facility] [phone] Office [phone] Cell

## 2022-04-17 ENCOUNTER — Other Ambulatory Visit: Payer: Self-pay | Admitting: Internal Medicine

## 2022-04-17 ENCOUNTER — Ambulatory Visit
Admission: RE | Admit: 2022-04-17 | Discharge: 2022-04-17 | Disposition: A | Discharge status: Home / Self Care | Payer: Medicaid Other | Source: Ambulatory Visit | Attending: Internal Medicine | Admitting: Internal Medicine

## 2022-04-17 DIAGNOSIS — R55 Syncope and collapse: Secondary | ICD-10-CM | POA: Diagnosis present

## 2022-04-17 MED ORDER — GADOBUTROL 1 MMOL/ML IV SOLN
12.0000 mL | Freq: Once | INTRAVENOUS | Status: AC | PRN
  Administered 2022-04-17: 12 mL via INTRAVENOUS

## 2022-04-20 NOTE — Progress Notes (Unsigned)
Virtual Visit via Video Note  I connected with Kendra [name] on 04/24/22 at  8:00 AM EDT by a video enabled telemedicine application and verified that I am speaking with the correct person using two identifiers.  Location: Patient: home Provider: office Persons participated in the visit- patient, provider    I discussed the limitations of evaluation and management by telemedicine and the availability of in person appointments. The patient expressed understanding and agreed to proceed.    I discussed the assessment and treatment plan with the patient. The patient was provided an opportunity to ask questions and all were answered. The patient agreed with the plan and demonstrated an understanding of the instructions.   The patient was advised to call back or seek an in-person evaluation if the symptoms worsen or if the condition fails to improve as anticipated.  I provided 17 minutes of non-face-to-face time during this encounter.   Norman Clay, MD    [facility] MD/PA/NP OP Progress Note  04/24/2022 8:25 AM KIEYA SANDOR  MRN:  [id number]  Chief Complaint:  Chief Complaint  Patient presents with   Follow-up   HPI:  - she had cardiac MRI; no significant finding to explain her cardiac symptoms.  This is a follow-up appointment for depression and anxiety.  She states that she has been doing much better.  She thinks taking sertraline consistently has made a huge difference.  She states that she does not have any panic attack, and she has been able to be by herself without worrying.  She has started to do house chores.  Only the things she is concerned is eating restriction.  She was reportedly advised by the pharmacy that she cannot eat, chocolate, bananas, avocado, aged cheese.  She states that she was not taking sertraline consistently even when she first saw this Probation officer.  She did not think it was a big deal.  She was not taking it consistently as she was concerned of its potential  side effect.  However, she thinks the current dose is working very well, and feels comfortable to stay on the medication.  She sleeps better.  Although she tends to have less appetite in the morning, she has been this way for many years.  She denies SI.  She thinks her physical symptoms are 80% better.  She only notices some discomfort in her shoulder at times.  She feels reassured by the recent cardiac MRI, and is waiting to hear back from her doctor.    Visit Diagnosis:    ICD-10-CM   1. Recurrent major depressive disorder, in partial remission ([place])  F33.41     2. Anxiety state  F41.1     3. Panic disorder  F41.0       Past Psychiatric History: Please see initial evaluation for full details. I have reviewed the history. No updates at this time.     Past Medical History:  Past Medical History:  Diagnosis Date   Anxiety    Depression    GERD (gastroesophageal reflux disease)    Headache    stress   Heart murmur    History of cannabis abuse    Seizures ([place])    age 2. syncopal episode. Hit head. had seizure.    Tobacco abuse    Vitamin D deficiency     Past Surgical History:  Procedure Laterality Date   COLON SURGERY  2013   pt states "colonoscopy while awake"   COLONOSCOPY WITH PROPOFOL N/A 12/14/2015   Procedure:  COLONOSCOPY WITH PROPOFOL;  Surgeon: Lucilla Lame, MD;  Location: [place];  Service: Endoscopy;  Laterality: N/A;   ESOPHAGOGASTRODUODENOSCOPY (EGD) WITH PROPOFOL N/A 12/14/2015   Procedure: ESOPHAGOGASTRODUODENOSCOPY (EGD) WITH PROPOFOL;  Surgeon: Lucilla Lame, MD;  Location: [facility];  Service: Endoscopy;  Laterality: N/A;   ESOPHAGOGASTRODUODENOSCOPY (EGD) WITH PROPOFOL N/A 02/21/2022   Procedure: ESOPHAGOGASTRODUODENOSCOPY (EGD) WITH BIOPSY;  Surgeon: Lin Landsman, MD;  Location: [facility];  Service: Endoscopy;  Laterality: N/A;   LAPAROSCOPIC TUBAL LIGATION Bilateral 12/03/2021   Procedure: LAPAROSCOPIC TUBAL LIGATION;   Surgeon: Harlin Heys, MD;  Location: [facility];  Service: Gynecology;  Laterality: Bilateral;   WISDOM TOOTH EXTRACTION  2013    Family Psychiatric History: Please see initial evaluation for full details. I have reviewed the history. No updates at this time.     Family History:  Family History  Problem Relation Age of Onset   Depression Mother    Diabetes Maternal Grandfather    Congestive Heart Failure Maternal Grandfather    Diverticulitis Maternal Grandmother    Dementia Maternal Grandmother     Social History:  Social History   Socioeconomic History   Marital status: Married    Spouse name: Not on file   Number of children: Not on file   Years of education: Not on file   Highest education level: Not on file  Occupational History   Not on file  Tobacco Use   Smoking status: Every Day    Packs/day: 1.00    Years: 8.00    Total pack years: 8.00    Types: Cigarettes   Smokeless tobacco: Never  Vaping Use   Vaping Use: Never used  Substance and Sexual Activity   Alcohol use: No   Drug use: Not Currently    Types: Marijuana   Sexual activity: Yes    Birth control/protection: Surgical    Comment: BTL  Other Topics Concern   Not on file  Social History Narrative   Not on file   Social Determinants of Health   Financial Resource Strain: Low Risk  (12/11/2021)   Overall Financial Resource Strain (CARDIA)    Difficulty of Paying Living Expenses: Not very hard  Food Insecurity: No Food Insecurity (10/11/2021)   Hunger Vital Sign    Worried About Running Out of Food in the Last Year: Never true    Ran Out of Food in the Last Year: Never true  Transportation Needs: No Transportation Needs (09/13/2021)   PRAPARE - Hydrologist (Medical): No    Lack of Transportation (Non-Medical): No  Physical Activity: Sufficiently Active (12/11/2021)   Exercise Vital Sign    Days of Exercise per Week: 5 days    Minutes of Exercise per Session: 60  min  Stress: No Stress Concern Present (11/07/2021)   [place]    Feeling of Stress : Only a little  Social Connections: Not on file    Allergies: No Known Allergies  Metabolic Disorder Labs: No results found for: "HGBA1C", "MPG" No results found for: "PROLACTIN" No results found for: "CHOL", "TRIG", "HDL", "CHOLHDL", "VLDL", "LDLCALC" Lab Results  Component Value Date   TSH 2.145 03/18/2022   TSH 2.583 09/16/2021    Therapeutic Level Labs: No results found for: "LITHIUM" No results found for: "VALPROATE" No results found for: "CBMZ"  Current Medications: Current Outpatient Medications  Medication Sig Dispense Refill   buprenorphine-naloxone (SUBOXONE) 8-2 mg SUBL SL  tablet Place 1 tablet under the tongue daily. Patient uses 8mg  film bid     meclizine (ANTIVERT) 25 MG tablet Take 1 tablet (25 mg total) by mouth 3 (three) times daily as needed for dizziness. 30 tablet 0   meloxicam (MOBIC) 15 MG tablet Take 15 mg by mouth daily as needed.     methocarbamol (ROBAXIN) 500 MG tablet Take by mouth.     pantoprazole (PROTONIX) 40 MG tablet Take 1 tablet by mouth daily.     valACYclovir (VALTREX) 500 MG tablet Take 1 tablet (500 mg total) by mouth daily. Can increase to twice a day for 5 days in the event of a recurrence 30 tablet 12   No current facility-administered medications for this visit.     Musculoskeletal: Strength & Muscle Tone:  N/A Gait & Station:  N/A Patient leans: N/A  Psychiatric Specialty Exam: Review of Systems  Psychiatric/Behavioral: Negative.    All other systems reviewed and are negative.   Last menstrual period 04/10/2022.There is no height or weight on file to calculate BMI.  General Appearance: Fairly Groomed  Eye Contact:  Good  Speech:  Clear and Coherent  Volume:  Normal  Mood:   better  Affect:  Appropriate, Congruent, and Full Range  Thought Process:  Coherent   Orientation:  Full (Time, Place, and Person)  Thought Content: Logical   Suicidal Thoughts:  No  Homicidal Thoughts:  No  Memory:  Immediate;   Good  Judgement:  Good  Insight:  Good  Psychomotor Activity:  Normal  Concentration:  Concentration: Good and Attention Span: Good  Recall:  Good  Fund of Knowledge: Good  Language: Good  Akathisia:  No  Handed:  Right  AIMS (if indicated): not done  Assets:  Communication Skills Desire for Improvement  ADL's:  Intact  Cognition: WNL  Sleep:  Good   Screenings: PHQ2-9    Flowsheet Row Video Visit from 12/05/2021 in [place] Patient Outreach Telephone from 10/15/2021 in [place] Coordination  PHQ-2 Total Score 1 4  PHQ-9 Total Score -- 10      Flowsheet Row ED from 04/15/2022 in [facility] [facility] from 04/02/2022 in [place] [facility] from 03/19/2022 in [place] No Risk No Risk No Risk        Assessment and Plan:  KIONA TOPF is a 31 y.o. year old female with a history of depression, anxiety, history of opioid dependence on Suboxone, seizure, cervical spondylosis, r/o fibromyalgia , who presents for follow up appointment for below.   1. Recurrent major depressive disorder, in partial remission ([place]) 2. Anxiety state 3. Panic disorder There has been significant improvement in depressive symptoms, anxiety, panic attacks, and somatic symptoms of chest pain since taking sertraline consistently.  She continues to have great relationship with her family, including her daughter, parents and grandmother.  Will continue current dose of sertraline to target depression and anxiety.  Discussed potential risk of QTc prolongation and serotonin syndrome with concomitant use of Suboxone.  Noted that she was advised by the pharmacy to limit  tyramine containing food; will contact the pharmacy to verify this.  Will hold lorazepam, quetiapine at this time given significant improvement in her symptoms.       # opioid dependence on Suboxone Unchanged. She has been on Suboxone for heroin dependence, prescribed at [facility].  She has been abstinent  for the past 4 years without any craving.  Will continue motivational interview.    Plan Continue sertraline 50 mg at night Discontinue quetiapine  Discontinue lorazepam Next appointment: 12/13 at 1:20 for 20 mins, video  Addendum: spoke with the pharmacist. He is not aware of any need/does not recommend food restriction, although that they are concerned about minor risk of QTc prolongation with concomitant use of Suboxone, quetiapine and sertraline.  Contacted the patient about this, and she verbalized understanding.    I have reviewed suicide assessment in detail. No change in the following assessment.    The patient demonstrates the following risk factors for suicide: Chronic risk factors for suicide include: psychiatric disorder of depression . Acute risk factors for suicide include: N/A. Protective factors for this patient include: positive social support, responsibility to others (children, family), and hope for the future. Considering these factors, the overall suicide risk at this point appears to be low. Patient is appropriate for outpatient follow up.       Collaboration of Care: Collaboration of Care: Other N/A  Patient/Guardian was advised Release of Information must be obtained prior to any record release in order to collaborate their care with an outside provider. Patient/Guardian was advised if they have not already done so to contact the registration department to sign all necessary forms in order for [place] to release information regarding their care.   Consent: Patient/Guardian gives verbal consent for treatment and assignment of benefits for services provided during this visit.  Patient/Guardian expressed understanding and agreed to proceed.    Norman Clay, MD 04/24/2022, 8:25 AM

## 2022-04-24 ENCOUNTER — Encounter: Payer: Self-pay | Admitting: Psychiatry

## 2022-04-24 ENCOUNTER — Telehealth (INDEPENDENT_AMBULATORY_CARE_PROVIDER_SITE_OTHER): Payer: Medicaid Other | Admitting: Psychiatry

## 2022-04-24 DIAGNOSIS — F3341 Major depressive disorder, recurrent, in partial remission: Secondary | ICD-10-CM

## 2022-04-24 DIAGNOSIS — F411 Generalized anxiety disorder: Secondary | ICD-10-CM | POA: Diagnosis not present

## 2022-04-24 DIAGNOSIS — F410 Panic disorder [episodic paroxysmal anxiety] without agoraphobia: Secondary | ICD-10-CM | POA: Diagnosis not present

## 2022-04-24 NOTE — Patient Instructions (Signed)
Continue sertraline 50 mg at night Discontinue quetiapine  Discontinue lorazepam Next appointment: 12/13 at 1:20

## 2022-04-30 ENCOUNTER — Other Ambulatory Visit: Payer: Self-pay

## 2022-04-30 ENCOUNTER — Emergency Department
Admission: EM | Admit: 2022-04-30 | Discharge: 2022-04-30 | Disposition: A | Discharge status: Home / Self Care | Payer: Medicaid Other | Attending: Emergency Medicine | Admitting: Emergency Medicine

## 2022-04-30 ENCOUNTER — Encounter: Payer: Self-pay | Admitting: Medical Oncology

## 2022-04-30 DIAGNOSIS — F17200 Nicotine dependence, unspecified, uncomplicated: Secondary | ICD-10-CM | POA: Insufficient documentation

## 2022-04-30 DIAGNOSIS — R300 Dysuria: Secondary | ICD-10-CM | POA: Diagnosis not present

## 2022-04-30 LAB — URINALYSIS, ROUTINE W REFLEX MICROSCOPIC
Bilirubin Urine: NEGATIVE
Glucose, UA: NEGATIVE mg/dL
Hgb urine dipstick: NEGATIVE
Ketones, ur: NEGATIVE mg/dL
Leukocytes,Ua: NEGATIVE
Nitrite: NEGATIVE
Protein, ur: 30 mg/dL — AB
Specific Gravity, Urine: 1.026 (ref 1.005–1.030)
Squamous Epithelial / HPF: >50 — ABNORMAL HIGH (ref 0–5)
pH: 7 (ref 5.0–8.0)

## 2022-04-30 LAB — CHLAMYDIA/NGC RT PCR (ARMC ONLY)
Chlamydia Tr: NOT DETECTED
N gonorrhoeae: NOT DETECTED

## 2022-04-30 LAB — WET PREP, GENITAL
Clue Cells Wet Prep HPF POC: NONE SEEN
Sperm: NONE SEEN
Trich, Wet Prep: NONE SEEN
WBC, Wet Prep HPF POC: <10 (ref <10)
Yeast Wet Prep HPF POC: NONE SEEN

## 2022-04-30 LAB — POC URINE PREG, ED: Preg Test, Ur: NEGATIVE

## 2022-04-30 MED ORDER — CEPHALEXIN 500 MG PO CAPS: 500.0000 mg | ORAL_CAPSULE | Freq: Two times a day (BID) | ORAL | 0 refills | Status: AC

## 2022-04-30 NOTE — ED Triage Notes (Signed)
Pt comes with c/o urinary burning pain and frequency. Pt states this started 5 days ago.

## 2022-04-30 NOTE — Discharge Instructions (Addendum)
-  Please take the full course of your antibiotics as prescribed.  -If your symptoms persist despite treatment, please return here or follow-up with your primary care provider.  -Return to the emergency department anytime if you begin to experience any new or worsening symptoms.

## 2022-04-30 NOTE — ED Provider Notes (Signed)
[facility] Provider Note    Event Date/Time   First MD Initiated Contact with Patient 04/30/22 [phone]     (approximate)   History   Chief Complaint UTI   HPI Kendra [name] is a 31 y.o. female, history of anxiety/depression, cannabis use, tobacco use, ADD, chronic GERD, presents to the emergency department for evaluation of dysuria.  She reports burning sensation if she urinates, as well as pain overlying her bladder x5 days.  Additionally states that she has had a new odor around the genital region as well.  She states that she has had urinary tract infections in the past, though this does not feel like it.  Denies fever/chills, myalgias, chest pain, shortness of breath, abdominal pain, flank pain, nausea/vomiting, diarrhea, abdominal pain, rash/lesions, or dizziness/lightheadedness.  History Limitations: No limitations.        Physical Exam  Triage Vital Signs: ED Triage Vitals  Enc Vitals Group     BP 04/30/22 0937 (!) 154/96     Pulse Rate 04/30/22 0937 94     Resp 04/30/22 0937 18     Temp 04/30/22 0937 98.8 F (37.1 C)     Temp src --      SpO2 04/30/22 0937 100 %     Weight 04/30/22 0948 219 lb 12.8 oz (99.7 kg)     Height 04/30/22 0948 5\' 7"  (1.702 m)     Head Circumference --      Peak Flow --      Pain Score 04/30/22 0936 4     Pain Loc --      Pain Edu? --      Excl. in [place]? --     Most recent vital signs: Vitals:   04/30/22 0937  BP: (!) 154/96  Pulse: 94  Resp: 18  Temp: 98.8 F (37.1 C)  SpO2: 100%    General: Awake, NAD.  Skin: Warm, dry. No rashes or lesions.  Eyes: PERRL. Conjunctivae normal.  CV: Good peripheral perfusion.  Resp: Normal effort.  Abd: Soft, non-tender. No distention.  Neuro: At baseline. No gross neurological deficits.  Musculoskeletal: Normal ROM of all extremities.  Focused Exam: Mild tenderness with palpation of the suprapubic region.  No surrounding warmth or erythema.  Physical  Exam    ED Results / Procedures / Treatments  Labs (all labs ordered are listed, but only abnormal results are displayed) Labs Reviewed  URINALYSIS, ROUTINE W REFLEX MICROSCOPIC - Abnormal; Notable for the following components:      Result Value   Color, Urine YELLOW (*)    APPearance CLOUDY (*)    Protein, ur 30 (*)    Bacteria, UA FEW (*)    Squamous Epithelial / LPF >50 (*)    All other components within normal limits  WET PREP, GENITAL  CHLAMYDIA/NGC RT PCR ([facility] ONLY)            POC URINE PREG, ED     EKG N/A.    RADIOLOGY  ED Provider Interpretation: N/A.  No results found.  PROCEDURES:  Critical Care performed: N/A.  Procedures    MEDICATIONS ORDERED IN ED: Medications - No data to display   IMPRESSION / MDM / ASSESSMENT AND PLAN / ED COURSE  I reviewed the triage vital signs and the nursing notes.                              Differential diagnosis  includes, but is not limited to, cystitis, trichomonas, chlamydia/gonorrhea, candidiasis, bacterial vaginosis.  Assessment/Plan Patient presents with dysuria x5 days.  Blood prep unremarkable.  Complaint/gonorrhea screening negative.  Urine pregnancy negative.  Patient appears well clinically.  Urinalysis does show few bacteria, though also likely contamination given the presence of squamous epithelial cells.  However, given her suprapubic pain associated with dysuria, we discussed the possibility of treating with antibiotics and see how she responds.  She states that she would prefer this.  Provided with a prescription for cephalexin.  Encouraged her to follow-up with her primary care provider if her symptoms persist.  She was amenable to this plan.  Will discharge.  Provided the patient with anticipatory guidance, return precautions, and educational material. Encouraged the patient to return to the emergency department at any time if they begin to experience any new or worsening symptoms. Patient expressed  understanding and agreed with the plan.   Patient's presentation is most consistent with acute complicated illness / injury requiring diagnostic workup.       FINAL CLINICAL IMPRESSION(S) / ED DIAGNOSES   Final diagnoses:  Dysuria     Rx / DC Orders   ED Discharge Orders          Ordered    cephALEXin (KEFLEX) 500 MG capsule  2 times daily        04/30/22 1136             Note:  This document was prepared using Dragon voice recognition software and may include unintentional dictation errors.   Teodoro Spray, [place] 04/30/22 1337    Harvest Dark, MD 04/30/22 1442

## 2022-05-03 ENCOUNTER — Telehealth: Payer: Medicaid Other | Admitting: Psychiatry

## 2022-05-19 ENCOUNTER — Emergency Department: Payer: Medicaid Other

## 2022-05-19 ENCOUNTER — Other Ambulatory Visit: Payer: Self-pay

## 2022-05-19 ENCOUNTER — Emergency Department
Admission: EM | Admit: 2022-05-19 | Discharge: 2022-05-19 | Disposition: A | Discharge status: Home / Self Care | Payer: Medicaid Other | Attending: Emergency Medicine | Admitting: Emergency Medicine

## 2022-05-19 DIAGNOSIS — R000 Tachycardia, unspecified: Secondary | ICD-10-CM | POA: Diagnosis not present

## 2022-05-19 DIAGNOSIS — R42 Dizziness and giddiness: Secondary | ICD-10-CM | POA: Diagnosis not present

## 2022-05-19 DIAGNOSIS — R0789 Other chest pain: Secondary | ICD-10-CM

## 2022-05-19 DIAGNOSIS — R300 Dysuria: Secondary | ICD-10-CM | POA: Diagnosis not present

## 2022-05-19 LAB — CBC WITH DIFFERENTIAL/PLATELET
Abs Immature Granulocytes: 0.08 10*3/uL — ABNORMAL HIGH (ref 0.00–0.07)
Basophils Absolute: 0 10*3/uL (ref 0.0–0.1)
Basophils Relative: 1 %
Eosinophils Absolute: 0.1 10*3/uL (ref 0.0–0.5)
Eosinophils Relative: 1 %
HCT: 39.9 % (ref 36.0–46.0)
Hemoglobin: 13.5 g/dL (ref 12.0–15.0)
Immature Granulocytes: 1 %
Lymphocytes Relative: 29 %
Lymphs Abs: 1.6 10*3/uL (ref 0.7–4.0)
MCH: 33 pg (ref 26.0–34.0)
MCHC: 33.8 g/dL (ref 30.0–36.0)
MCV: 97.6 fL (ref 80.0–100.0)
Monocytes Absolute: 0.6 10*3/uL (ref 0.1–1.0)
Monocytes Relative: 10 %
Neutro Abs: 3.3 10*3/uL (ref 1.7–7.7)
Neutrophils Relative %: 58 %
Platelets: 244 10*3/uL (ref 150–400)
RBC: 4.09 MIL/uL (ref 3.87–5.11)
RDW: 11.6 % (ref 11.5–15.5)
WBC: 5.7 10*3/uL (ref 4.0–10.5)
nRBC: 0 % (ref 0.0–0.2)

## 2022-05-19 LAB — URINALYSIS, ROUTINE W REFLEX MICROSCOPIC
Bilirubin Urine: NEGATIVE
Glucose, UA: NEGATIVE mg/dL
Hgb urine dipstick: NEGATIVE
Ketones, ur: NEGATIVE mg/dL
Leukocytes,Ua: NEGATIVE
Nitrite: NEGATIVE
Protein, ur: NEGATIVE mg/dL
Specific Gravity, Urine: 1.03 (ref 1.005–1.030)
pH: 5 (ref 5.0–8.0)

## 2022-05-19 LAB — COMPREHENSIVE METABOLIC PANEL
ALT: 13 U/L (ref 0–44)
AST: 14 U/L — ABNORMAL LOW (ref 15–41)
Albumin: 3.9 g/dL (ref 3.5–5.0)
Alkaline Phosphatase: 46 U/L (ref 38–126)
Anion gap: 4 — ABNORMAL LOW (ref 5–15)
BUN: 12 mg/dL (ref 6–20)
CO2: 29 mmol/L (ref 22–32)
Calcium: 9.2 mg/dL (ref 8.9–10.3)
Chloride: 104 mmol/L (ref 98–111)
Creatinine, Ser: 0.85 mg/dL (ref 0.44–1.00)
GFR, Estimated: >60 mL/min (ref >60)
Glucose, Bld: 102 mg/dL — ABNORMAL HIGH (ref 70–99)
Potassium: 3.7 mmol/L (ref 3.5–5.1)
Sodium: 137 mmol/L (ref 135–145)
Total Bilirubin: 0.5 mg/dL (ref 0.3–1.2)
Total Protein: 7.2 g/dL (ref 6.5–8.1)

## 2022-05-19 LAB — TROPONIN I (HIGH SENSITIVITY): Troponin I (High Sensitivity): 3 ng/L (ref <18)

## 2022-05-19 NOTE — ED Notes (Signed)
Pt refusing xray at this time until seeing the provider.

## 2022-05-19 NOTE — ED Provider Notes (Signed)
[facility] Provider Note    Event Date/Time   First MD Initiated Contact with Patient 05/19/22 [phone]     (approximate)   History   Dizziness   HPI  Kendra [name] is a 31 y.o. female  here with chest pain. Pt reports that her sx started overnight with low-grade tachycardia, with HR in the 100s that is higher than her usual. She had begun to feel an aching, mild, upper back pain between her shoulder blades last night. Describes it as a pressure sensation. She checked her HR with a pulse ox and it was in the 100-110 range which is higher than usual. When she awoke this AM, she continued to have HR 100s and it persisted, so she presents for evaluation. HR has now returned to normal. She has some mild ongoing aching back pain but otherwise feels well. Denies any fevers, chills, nausea, vomiting. No recent infectious sx. She does endorse some mild dysuria that has been ongoing for weeks but states she was tested and told she did not have a UTI.       Physical Exam   Triage Vital Signs: ED Triage Vitals  Enc Vitals Group     BP 05/19/22 0621 123/86     Pulse Rate 05/19/22 0621 96     Resp 05/19/22 0621 16     Temp 05/19/22 0621 98.5 F (36.9 C)     Temp Source 05/19/22 0621 Oral     SpO2 05/19/22 0621 99 %     Weight 05/19/22 0622 220 lb (99.8 kg)     Height 05/19/22 0622 5\' 7"  (1.702 m)     Head Circumference --      Peak Flow --      Pain Score 05/19/22 0622 7     Pain Loc --      Pain Edu? --      Excl. in [place]? --     Most recent vital signs: Vitals:   05/19/22 0700 05/19/22 0849  BP: 116/69 114/69  Pulse: 80 92  Resp: 16 14  Temp:  97.8 F (36.6 C)  SpO2: 99% 100%     General: Awake, no distress.  CV:  Good peripheral perfusion. RRR. No m/r/g. Resp:  Normal effort. Lungs CTAB. Abd:  No distention. No tenderness. Other:  No LE edema.   ED Results / Procedures / Treatments   Labs (all labs ordered are listed, but only abnormal  results are displayed) Labs Reviewed  CBC WITH DIFFERENTIAL/PLATELET - Abnormal; Notable for the following components:      Result Value   Abs Immature Granulocytes 0.08 (*)    All other components within normal limits  COMPREHENSIVE METABOLIC PANEL - Abnormal; Notable for the following components:   Glucose, Bld 102 (*)    AST 14 (*)    Anion gap 4 (*)    All other components within normal limits  URINALYSIS, ROUTINE W REFLEX MICROSCOPIC - Abnormal; Notable for the following components:   Color, Urine YELLOW (*)    APPearance CLOUDY (*)    All other components within normal limits  TROPONIN I (HIGH SENSITIVITY)     EKG Normal sinus rhythm, ventricular rate 84.  PR 173, QRS 113, QTc 451.  No acute ST elevations or depressions.  No acute evidence of acute ischemia or infarct.   RADIOLOGY    I also independently reviewed and agree with radiologist interpretations.   PROCEDURES:  Critical Care performed: No  .1-3 Lead  EKG Interpretation  Performed by: Duffy Bruce, MD Authorized by: Duffy Bruce, MD     Interpretation: normal     ECG rate:  80-90   ECG rate assessment: normal     Rhythm: sinus rhythm     Ectopy: none     Conduction: normal   Comments:     Indication: Palpitations    MEDICATIONS ORDERED IN ED: Medications - No data to display   IMPRESSION / MDM / [place] / ED COURSE  I reviewed the triage vital signs and the nursing notes.                               The patient is on the cardiac monitor to evaluate for evidence of arrhythmia and/or significant heart rate changes.   Ddx:  Differential includes the following, with pertinent life- or limb-threatening emergencies considered:  Sinus tachycardia, anxiety, arrhythmia/SVT, anemia, electrolyte abnormality, MSK pain causing ST, GERD/gastritis/esophageal spasm  Patient's presentation is most consistent with acute presentation with potential threat to life or bodily  function.  MDM:  31 year old female with history of recurrent ED visits for chest pain status post extensive cardiac and GI work-up here with tachycardia this morning.  Unclear etiology, suspect this could be related to anxiety versus pain from possible musculoskeletal source in her shoulders.  Low concern for ACS given her recent extensive negative cardiac work-up and EKG is nonischemic with negative troponin here.  CBC and electrolytes are unremarkable.  She has no evidence of arrhythmia on telemetry.  She has had no tachycardia here.  She has no hypoxia, tachypnea, leg swelling, or evidence to suggest DVT or PE and she has had recurrent PE studies without any evidence of this.  She feels back to her baseline.  Will treat supportively with encouraged hydration and good return precautions.   MEDICATIONS GIVEN IN ED: Medications - No data to display   Consults:     EMR reviewed  Extensive reviewed prior notes including cardiology notes, cardiac MRI, EGD     FINAL CLINICAL IMPRESSION(S) / ED DIAGNOSES   Final diagnoses:  Atypical chest pain     Rx / DC Orders   ED Discharge Orders     None        Note:  This document was prepared using Dragon voice recognition software and may include unintentional dictation errors.   Duffy Bruce, MD 05/19/22 [phone]

## 2022-05-19 NOTE — ED Notes (Signed)
Pt states coming in due to pain between her shoulder blades and an elevated heart rate at 112bpm. Pt states it started last night and came in this morning. Pt states a previous incident of this and was seen at cardiology. Pt on cardiac, bp and pulse ox monitor.  EKG completed and given to Dr. Ellender Hose for review.

## 2022-05-19 NOTE — ED Triage Notes (Signed)
Pt states has pain between shoulder blades, has been having tachycardia and dizziness. Pt appears in no acute distress, denies shob, fever, vomiting. Pt states her resting hr is usually in 60s.

## 2022-05-19 NOTE — ED Notes (Signed)
ED Provider at bedside. 

## 2022-06-05 ENCOUNTER — Other Ambulatory Visit: Payer: Self-pay

## 2022-06-05 ENCOUNTER — Emergency Department
Admission: EM | Admit: 2022-06-05 | Discharge: 2022-06-05 | Disposition: A | Discharge status: Home / Self Care | Payer: Medicaid Other | Attending: Emergency Medicine | Admitting: Emergency Medicine

## 2022-06-05 DIAGNOSIS — R110 Nausea: Secondary | ICD-10-CM | POA: Insufficient documentation

## 2022-06-05 DIAGNOSIS — R109 Unspecified abdominal pain: Secondary | ICD-10-CM | POA: Diagnosis present

## 2022-06-05 DIAGNOSIS — R300 Dysuria: Secondary | ICD-10-CM | POA: Diagnosis not present

## 2022-06-05 LAB — URINALYSIS, ROUTINE W REFLEX MICROSCOPIC
Bacteria, UA: NONE SEEN
Bilirubin Urine: NEGATIVE
Glucose, UA: NEGATIVE mg/dL
Ketones, ur: NEGATIVE mg/dL
Nitrite: NEGATIVE
Protein, ur: NEGATIVE mg/dL
Specific Gravity, Urine: 1.025 (ref 1.005–1.030)
pH: 6 (ref 5.0–8.0)

## 2022-06-05 LAB — COMPREHENSIVE METABOLIC PANEL
ALT: 13 U/L (ref 0–44)
AST: 14 U/L — ABNORMAL LOW (ref 15–41)
Albumin: 4.2 g/dL (ref 3.5–5.0)
Alkaline Phosphatase: 50 U/L (ref 38–126)
Anion gap: 5 (ref 5–15)
BUN: 22 mg/dL — ABNORMAL HIGH (ref 6–20)
CO2: 29 mmol/L (ref 22–32)
Calcium: 9.5 mg/dL (ref 8.9–10.3)
Chloride: 104 mmol/L (ref 98–111)
Creatinine, Ser: 0.9 mg/dL (ref 0.44–1.00)
GFR, Estimated: >60 mL/min (ref >60)
Glucose, Bld: 110 mg/dL — ABNORMAL HIGH (ref 70–99)
Potassium: 3.9 mmol/L (ref 3.5–5.1)
Sodium: 138 mmol/L (ref 135–145)
Total Bilirubin: 0.7 mg/dL (ref 0.3–1.2)
Total Protein: 8 g/dL (ref 6.5–8.1)

## 2022-06-05 LAB — CBC
HCT: 41.5 % (ref 36.0–46.0)
Hemoglobin: 14.2 g/dL (ref 12.0–15.0)
MCH: 32.9 pg (ref 26.0–34.0)
MCHC: 34.2 g/dL (ref 30.0–36.0)
MCV: 96.3 fL (ref 80.0–100.0)
Platelets: 273 10*3/uL (ref 150–400)
RBC: 4.31 MIL/uL (ref 3.87–5.11)
RDW: 11.8 % (ref 11.5–15.5)
WBC: 5.2 10*3/uL (ref 4.0–10.5)
nRBC: 0 % (ref 0.0–0.2)

## 2022-06-05 LAB — PREGNANCY, URINE: Preg Test, Ur: NEGATIVE

## 2022-06-05 LAB — LIPASE, BLOOD: Lipase: 32 U/L (ref 11–51)

## 2022-06-05 NOTE — ED Provider Notes (Signed)
   [facility] Provider Note    Event Date/Time   First MD Initiated Contact with Patient 06/05/22 [phone]     (approximate)  History   Chief Complaint: Abdominal Pain  HPI  Kendra [name] is a 31 y.o. female with a past medical history of anxiety, gastric reflux, presents to the emergency department for abdominal discomfort.  According to the patient for the past 1 week she has been experiencing abdominal pain as well as burning with urination.  Patient denies any fever but states she has been nauseated with occasional episodes of loose stool.  No vomiting.  Physical Exam   Triage Vital Signs: ED Triage Vitals  Enc Vitals Group     BP 06/05/22 0906 114/77     Pulse Rate 06/05/22 0906 83     Resp 06/05/22 0906 16     Temp 06/05/22 0906 98.2 F (36.8 C)     Temp Source 06/05/22 0906 Oral     SpO2 06/05/22 0906 96 %     Weight 06/05/22 0906 220 lb (99.8 kg)     Height 06/05/22 0906 5\' 7"  (1.702 m)     Head Circumference --      Peak Flow --      Pain Score 06/05/22 0909 8     Pain Loc --      Pain Edu? --      Excl. in [place]? --     Most recent vital signs: Vitals:   06/05/22 0906  BP: 114/77  Pulse: 83  Resp: 16  Temp: 98.2 F (36.8 C)  SpO2: 96%    General: Awake, no distress.  CV:  Good peripheral perfusion.  Regular rate and rhythm  Resp:  Normal effort.  Equal breath sounds bilaterally.  Abd:  No distention.  Soft, slight suprapubic tenderness otherwise benign abdomen without rebound or guarding.    ED Results / Procedures / Treatments   MEDICATIONS ORDERED IN ED: Medications - No data to display   IMPRESSION / MDM / ASSESSMENT AND PLAN / ED COURSE  I reviewed the triage vital signs and the nursing notes.  Patient's presentation is most consistent with acute presentation with potential threat to life or bodily function.  Patient presents emergency department for abdominal discomfort as well as burning with urination.  Patient  denies any vaginal discharge, just finished her menstrual cycle.  States some nausea but no vomiting, occasional loose stool.  Patient has very slight suprapubic tenderness otherwise benign abdomen on my exam.  Vital signs are reassuring.  Physical exam overall reassuring as well.  We will check labs and a urinalysis.  Differential would include UTI, pyelonephritis, gastroenteritis, less likely colitis, metabolic or electrolyte abnormality.  Patient's work-up is reassuring.  Analysis shows no sign of infection I have added a urine culture as a precaution.  Patient's lab work including CBC is normal, chemistry is normal, lipase is normal.  Pregnancy test is negative.  Given the patient's reassuring work-up reassuring physical exam and reassuring vitals I believe the patient is safe for discharge home with PCP follow-up.  Discussed these results with the patient.  Patient agreeable to plan of care.  FINAL CLINICAL IMPRESSION(S) / ED DIAGNOSES   Abdominal pain Dysuria   Note:  This document was prepared using Dragon voice recognition software and may include unintentional dictation errors.   13/08/23, MD 06/05/22 1022

## 2022-06-05 NOTE — ED Triage Notes (Signed)
Patient to ED via POV for generalized abd pain. Patient states she does have burning with urination. Patient states she has D/N but no vomiting.

## 2022-06-06 LAB — URINE CULTURE

## 2022-06-18 ENCOUNTER — Other Ambulatory Visit: Payer: Self-pay

## 2022-06-18 ENCOUNTER — Encounter: Payer: Self-pay | Admitting: Emergency Medicine

## 2022-06-18 ENCOUNTER — Emergency Department
Admission: EM | Admit: 2022-06-18 | Discharge: 2022-06-18 | Disposition: A | Discharge status: Home / Self Care | Payer: Medicaid Other | Attending: Emergency Medicine | Admitting: Emergency Medicine

## 2022-06-18 DIAGNOSIS — Z23 Encounter for immunization: Secondary | ICD-10-CM | POA: Insufficient documentation

## 2022-06-18 DIAGNOSIS — X58XXXA Exposure to other specified factors, initial encounter: Secondary | ICD-10-CM | POA: Diagnosis not present

## 2022-06-18 DIAGNOSIS — S3141XA Laceration without foreign body of vagina and vulva, initial encounter: Secondary | ICD-10-CM | POA: Insufficient documentation

## 2022-06-18 DIAGNOSIS — R102 Pelvic and perineal pain: Secondary | ICD-10-CM | POA: Diagnosis present

## 2022-06-18 MED ORDER — LIDOCAINE HCL (PF) 1 % IJ SOLN
5.0000 mL | Freq: Once | INTRAMUSCULAR | Status: AC
  Administered 2022-06-18: 5 mL
  Filled 2022-06-18: qty 5

## 2022-06-18 MED ORDER — TETANUS-DIPHTH-ACELL PERTUSSIS 5-2.5-18.5 LF-MCG/0.5 IM SUSY
0.5000 mL | PREFILLED_SYRINGE | Freq: Once | INTRAMUSCULAR | Status: AC
  Administered 2022-06-18: 0.5 mL via INTRAMUSCULAR
  Filled 2022-06-18: qty 0.5

## 2022-06-18 NOTE — ED Provider Notes (Signed)
[facility] Provider Note    Event Date/Time   First MD Initiated Contact with Patient 06/18/22 [phone]     (approximate)   History   Laceration   HPI  Kendra [name] is a 31 y.o. female otherwise healthy who comes in with concerns for vaginal pain.  Patient reports that she was having intercourse with her husband yesterday when her nail scratched her vagina which left her to have some bleeding.  She reports that the incident happened around 10:30 PM last night.  She reports that the bleeding restarted this morning therefore she decided to come to the emergency room.  She does report recently having dental surgery and currently on penicillin for that.  Physical Exam   Triage Vital Signs: ED Triage Vitals  Enc Vitals Group     BP 06/18/22 0814 (!) 141/89     Pulse Rate 06/18/22 0813 93     Resp 06/18/22 0813 16     Temp 06/18/22 0813 98.1 F (36.7 C)     Temp src --      SpO2 06/18/22 0813 100 %     Weight 06/18/22 0814 220 lb (99.8 kg)     Height 06/18/22 0814 5\' 7"  (1.702 m)     Head Circumference --      Peak Flow --      Pain Score 06/18/22 0814 7     Pain Loc --      Pain Edu? --      Excl. in [place]? --     Most recent vital signs: Vitals:   06/18/22 0813 06/18/22 0814  BP:  (!) 141/89  Pulse: 93   Resp: 16   Temp: 98.1 F (36.7 C)   SpO2: 100%      General: Awake, no distress.  CV:  Good peripheral perfusion.  Resp:  Normal effort.  Abd:  No distention.  Patient has about a 3 cm laceration to the left outer labia majora.  Does not transverse the urethra or the clitoris.  There is no inner labial laceration or lacerations inside the vagina.  It is superficial in nature with only exposure of additional tissue underneath but does have a little bit of active bleeding noted  ED Results / Procedures / Treatments   Labs (all labs ordered are listed, but only abnormal results are displayed) Labs Reviewed - No data to  display  PROCEDURES:  Critical Care performed: No  ..Laceration Repair  Date/Time: 06/18/2022 9:03 AM  Performed by: 06/20/2022, MD Authorized by: Concha Se, MD   Consent:    Consent obtained:  Verbal   Consent given by:  Patient   Risks discussed:  Infection, pain, retained foreign body, poor cosmetic result, need for additional repair, nerve damage, poor wound healing, vascular damage and tendon damage   Alternatives discussed:  No treatment Universal protocol:    Patient identity confirmed:  Verbally with patient Anesthesia:    Anesthesia method:  Local infiltration   Local anesthetic:  Lidocaine 1% w/o epi Laceration details:    Location:  Anogenital   Anogenital location:  Vagina   Length (cm):  3   Depth (mm):  1 Pre-procedure details:    Preparation:  Patient was prepped and draped in usual sterile fashion Exploration:    Contaminated: no   Treatment:    Area cleansed with:  Saline   Amount of cleaning:  Standard   Irrigation volume:  100cc   Irrigation method:  Pressure wash Skin repair:    Repair method:  Sutures   Suture size:  4-0   Suture material:  Fast-absorbing gut   Number of sutures:  3 Approximation:    Approximation:  Close Repair type:    Repair type:  Simple Post-procedure details:    Dressing:  Open (no dressing)   Procedure completion:  Tolerated well, no immediate complications    MEDICATIONS ORDERED IN ED: Medications  lidocaine (PF) (XYLOCAINE) 1 % injection 5 mL (5 mLs Other Given by Other 06/18/22 0857)  Tdap (BOOSTRIX) injection 0.5 mL (0.5 mLs Intramuscular Given 06/18/22 0858)     IMPRESSION / MDM / ASSESSMENT AND PLAN / ED COURSE  I reviewed the triage vital signs and the nursing notes.   Patient's presentation is most consistent with acute, uncomplicated illness.   Patient comes in with concerns for vaginal laceration.  The laceration is superficial in nature and does not transverse near the urethra or the  clitoris.  Discussed repair versus leaving open but she is within the 12-hour repair and given the intermittent bleeding associated with it we have elected to place some sutures.  3 observable sutures were placed.  She had resolution of bleeding afterwards.  Tetanus was updated given it was over 7 years since last tetanus.  Patient reports following [facility] for wound check in 2 days.  We discussed return to the emergency room if she notices a hematoma underneath it fevers or any other concerns.  Patient already on antibiotics so we will hold off on additional ones but they can keep a close eye on it and have a wound check done in 2 days.      FINAL CLINICAL IMPRESSION(S) / ED DIAGNOSES   Final diagnoses:  Vaginal laceration, initial encounter     Rx / DC Orders   ED Discharge Orders     None        Note:  This document was prepared using Dragon voice recognition software and may include unintentional dictation errors.   Concha Se, MD 06/18/22 [phone]

## 2022-06-18 NOTE — ED Triage Notes (Signed)
Patient arrives ambulatory by POV c/o having a cut on her vaginal from her fingernail last night. Was able to get bleeding to stop last night but restarted this am while changing the dressing.

## 2022-06-18 NOTE — Discharge Instructions (Addendum)
We have repaired your laceration with 3 absorbable sutures.  You should follow-up for wound check in 2 days with your OB/GYN and otherwise return to the ER if you develop fevers, redness, worsening pain, hematoma or swelling or any other concerns.

## 2022-06-27 ENCOUNTER — Other Ambulatory Visit: Payer: Self-pay

## 2022-06-27 ENCOUNTER — Encounter: Payer: Self-pay | Admitting: Radiology

## 2022-06-27 ENCOUNTER — Emergency Department: Payer: Medicaid Other

## 2022-06-27 ENCOUNTER — Emergency Department
Admission: EM | Admit: 2022-06-27 | Discharge: 2022-06-27 | Disposition: A | Discharge status: Home / Self Care | Payer: Medicaid Other | Attending: Emergency Medicine | Admitting: Emergency Medicine

## 2022-06-27 DIAGNOSIS — M542 Cervicalgia: Secondary | ICD-10-CM | POA: Diagnosis not present

## 2022-06-27 DIAGNOSIS — G43819 Other migraine, intractable, without status migrainosus: Secondary | ICD-10-CM | POA: Insufficient documentation

## 2022-06-27 DIAGNOSIS — R519 Headache, unspecified: Secondary | ICD-10-CM | POA: Diagnosis present

## 2022-06-27 LAB — CBC WITH DIFFERENTIAL/PLATELET
Abs Immature Granulocytes: 0 10*3/uL (ref 0.00–0.07)
Basophils Absolute: 0 10*3/uL (ref 0.0–0.1)
Basophils Relative: 1 %
Eosinophils Absolute: 0.1 10*3/uL (ref 0.0–0.5)
Eosinophils Relative: 2 %
HCT: 40.1 % (ref 36.0–46.0)
Hemoglobin: 13.2 g/dL (ref 12.0–15.0)
Immature Granulocytes: 0 %
Lymphocytes Relative: 33 %
Lymphs Abs: 1.7 10*3/uL (ref 0.7–4.0)
MCH: 33.2 pg (ref 26.0–34.0)
MCHC: 32.9 g/dL (ref 30.0–36.0)
MCV: 100.8 fL — ABNORMAL HIGH (ref 80.0–100.0)
Monocytes Absolute: 0.6 10*3/uL (ref 0.1–1.0)
Monocytes Relative: 11 %
Neutro Abs: 2.8 10*3/uL (ref 1.7–7.7)
Neutrophils Relative %: 53 %
Platelets: 277 10*3/uL (ref 150–400)
RBC: 3.98 MIL/uL (ref 3.87–5.11)
RDW: 12.2 % (ref 11.5–15.5)
WBC: 5.3 10*3/uL (ref 4.0–10.5)
nRBC: 0 % (ref 0.0–0.2)

## 2022-06-27 LAB — BASIC METABOLIC PANEL
Anion gap: 4 — ABNORMAL LOW (ref 5–15)
BUN: 12 mg/dL (ref 6–20)
CO2: 27 mmol/L (ref 22–32)
Calcium: 9.2 mg/dL (ref 8.9–10.3)
Chloride: 109 mmol/L (ref 98–111)
Creatinine, Ser: 0.9 mg/dL (ref 0.44–1.00)
GFR, Estimated: >60 mL/min (ref >60)
Glucose, Bld: 94 mg/dL (ref 70–99)
Potassium: 4 mmol/L (ref 3.5–5.1)
Sodium: 140 mmol/L (ref 135–145)

## 2022-06-27 MED ORDER — ACETAMINOPHEN 500 MG PO TABS
1000.0000 mg | ORAL_TABLET | Freq: Once | ORAL | Status: AC
  Administered 2022-06-27: 1000 mg via ORAL
  Filled 2022-06-27: qty 2

## 2022-06-27 MED ORDER — IOHEXOL 350 MG/ML SOLN
75.0000 mL | Freq: Once | INTRAVENOUS | Status: AC | PRN
  Administered 2022-06-27: 75 mL via INTRAVENOUS

## 2022-06-27 NOTE — ED Triage Notes (Signed)
Left side neck pain x 2-3 days.  Denies injury.  AAOx3.  Skin warm and dry. NAD

## 2022-06-27 NOTE — ED Provider Notes (Signed)
[facility] Provider Note    Event Date/Time   First MD Initiated Contact with Patient 06/27/22 1151     (approximate)   History   Neck Pain   HPI  Kendra [name] is a 31 y.o. female otherwise healthy who comes in with concerns for neck pain.  Patient reports off-and-on neck pain for the past 4 days with intermittent headache.  The pain is on the left side and goes up into her head occasionally behind her left eye.  She reports no headache now but does have the neck discomfort.  Denies a headache being sudden and severe in onset.  Reports it is more of a gradually worsening -does report having a surgery on her right teeth but this area has looked good and does not have any infections there.  She states that the pain was not sudden or severe in onset but her mom had a history of an aneurysm and she is worried because her mom had similar symptoms prior to the aneurysm rupturing and she wanted to make sure that there was no aneurysm that could have a potential for rupture.  Denies any blurred vision at this time.   Physical Exam   Triage Vital Signs: ED Triage Vitals  Enc Vitals Group     BP 06/27/22 1131 130/88     Pulse Rate 06/27/22 1131 91     Resp 06/27/22 1131 18     Temp 06/27/22 1131 98.2 F (36.8 C)     Temp Source 06/27/22 1131 Oral     SpO2 06/27/22 1131 97 %     Weight 06/27/22 1127 220 lb 2.8 oz (99.9 kg)     Height 06/27/22 1127 5\' 7"  (1.702 m)     Head Circumference --      Peak Flow --      Pain Score 06/27/22 1126 7     Pain Loc --      Pain Edu? --      Excl. in [place]? --     Most recent vital signs: Vitals:   06/27/22 1131  BP: 130/88  Pulse: 91  Resp: 18  Temp: 98.2 F (36.8 C)  SpO2: 97%     General: Awake, no distress.  CV:  Good peripheral perfusion.  Resp:  Normal effort.  Abd:  No distention.  Other:  Pupils equal round and reactive.  Extraocular movements are intact.  Cranial nerves appear intact equal strength  in arms and legs.  She reports some left-sided neck discomfort but patient still has full range of motion of neck.  No obvious rash noted.  No swelling noted.   ED Results / Procedures / Treatments   Labs (all labs ordered are listed, but only abnormal results are displayed) Labs Reviewed  CBC WITH DIFFERENTIAL/PLATELET  BASIC METABOLIC PANEL    RADIOLOGY I have reviewed the CT head personally and interpreted no evidence of intracranial hemorrhage PROCEDURES:  Critical Care performed: No  Procedures   MEDICATIONS ORDERED IN ED: Medications  acetaminophen (TYLENOL) tablet 1,000 mg (has no administration in time range)     IMPRESSION / MDM / ASSESSMENT AND PLAN / ED COURSE  I reviewed the triage vital signs and the nursing notes.   Patient's presentation is most consistent with acute presentation with potential threat to life or bodily function.   Patient comes in with concerns for intermittent headaches with concerns for some left-sided neck pain.  She does not think she has an aneurysm  that ruptured but worried that she could have one that is going to rupture given her mom had similar symptoms before her aneurysm ruptured.  On examination she is neuro intact.  We discussed that if we were worried about a ruptured aneurysm that would be a CT head, LP that would be gold standard.  But if were just worried about a potential aneurysm that could rupture that a CT angio would be better to evaluate.  Her headache is not sudden or severe in onset and this does not seem consistent with an active subarachnoid hemorrhage with normal neurological examination and after further discussion with patient's about pros and cons patient would like to proceed with CT angio instead.  BMP was reassuring.  CBC reassuring.  Patient given some Tylenol for pain  I reviewed patient's cardiology note from 05/08/2022 where she was seen for syncope  Patient CT angio was reassuring.  On repeat assessment she  reports resolution of the neck pain with Tylenol.  She remains full range of motion of neck I do not feel that she is got meningitis or abscess or other acute pathology.  It could be a neck strain and/or migraines.  She already has a neurologist that she can follow-up with outpatient.  She denies any urinary symptoms or concerns for pregnancy so declined UA   FINAL CLINICAL IMPRESSION(S) / ED DIAGNOSES   Final diagnoses:  Other migraine without status migrainosus, intractable  Neck pain     Rx / DC Orders   ED Discharge Orders     None        Note:  This document was prepared using Dragon voice recognition software and may include unintentional dictation errors.   Concha Se, MD 06/27/22 [phone]

## 2022-06-27 NOTE — Discharge Instructions (Addendum)
Take Tylenol 1 g every 8 hours, ibuprofen 600 every 6-8 hours with food to help with pain.  Follow-up with your neurologist.  Return to the ER if develop worsening symptoms, fever or any other concerns  IMPRESSION: 1. Normal noncontrast head CT. 2. Normal CTA of the head and neck.  No aneurysm identified.

## 2022-06-27 NOTE — ED Notes (Signed)
See triage note. Pt ambulatory to room. Pt reports left sided neck pain x2-3 days accompanied by severe HA.

## 2022-06-28 ENCOUNTER — Ambulatory Visit: Payer: Medicaid Other | Admitting: Obstetrics and Gynecology

## 2022-07-08 NOTE — Progress Notes (Deleted)
BH MD/PA/NP OP Progress Note  07/08/2022 12:14 PM GROVER ZOOK  MRN:  [id number]  Chief Complaint: No chief complaint on file.  HPI:  - since the last viist, she presented to ED five times in the past three months for complaint including chest pain, vaginal pain, dysuria, abdominal pain and neck pain.      Visit Diagnosis: No diagnosis found.  Past Psychiatric History: Please see initial evaluation for full details. I have reviewed the history. No updates at this time.     Past Medical History:  Past Medical History:  Diagnosis Date   Anxiety    Depression    GERD (gastroesophageal reflux disease)    Headache    stress   Heart murmur    History of cannabis abuse    Seizures ([place])    age 52. syncopal episode. Hit head. had seizure.    Tobacco abuse    Vitamin D deficiency     Past Surgical History:  Procedure Laterality Date   COLON SURGERY  2013   pt states "colonoscopy while awake"   COLONOSCOPY WITH PROPOFOL N/A 12/14/2015   Procedure: COLONOSCOPY WITH PROPOFOL;  Surgeon: Lucilla Lame, MD;  Location: [place];  Service: Endoscopy;  Laterality: N/A;   ESOPHAGOGASTRODUODENOSCOPY (EGD) WITH PROPOFOL N/A 12/14/2015   Procedure: ESOPHAGOGASTRODUODENOSCOPY (EGD) WITH PROPOFOL;  Surgeon: Lucilla Lame, MD;  Location: [place];  Service: Endoscopy;  Laterality: N/A;   ESOPHAGOGASTRODUODENOSCOPY (EGD) WITH PROPOFOL N/A 02/21/2022   Procedure: ESOPHAGOGASTRODUODENOSCOPY (EGD) WITH BIOPSY;  Surgeon: Lin Landsman, MD;  Location: [place];  Service: Endoscopy;  Laterality: N/A;   LAPAROSCOPIC TUBAL LIGATION Bilateral 12/03/2021   Procedure: LAPAROSCOPIC TUBAL LIGATION;  Surgeon: Harlin Heys, MD;  Location: [facility];  Service: Gynecology;  Laterality: Bilateral;   WISDOM TOOTH EXTRACTION  2013    Family Psychiatric History: Please see initial evaluation for full details. I have reviewed the history. No updates at this time.     Family  History:  Family History  Problem Relation Age of Onset   Depression Mother    Diabetes Maternal Grandfather    Congestive Heart Failure Maternal Grandfather    Diverticulitis Maternal Grandmother    Dementia Maternal Grandmother     Social History:  Social History   Socioeconomic History   Marital status: Married    Spouse name: Not on file   Number of children: Not on file   Years of education: Not on file   Highest education level: Not on file  Occupational History   Not on file  Tobacco Use   Smoking status: Every Day    Packs/day: 1.00    Years: 8.00    Total pack years: 8.00    Types: Cigarettes   Smokeless tobacco: Never  Vaping Use   Vaping Use: Never used  Substance and Sexual Activity   Alcohol use: No   Drug use: Not Currently    Types: Marijuana   Sexual activity: Yes    Birth control/protection: Surgical    Comment: BTL  Other Topics Concern   Not on file  Social History Narrative   Not on file   Social Determinants of Health   Financial Resource Strain: Low Risk  (12/11/2021)   Overall Financial Resource Strain (CARDIA)    Difficulty of Paying Living Expenses: Not very hard  Food Insecurity: No Food Insecurity (10/11/2021)   Hunger Vital Sign    Worried About Running Out of Food in the Last Year: Never true  Ran Out of Food in the Last Year: Never true  Transportation Needs: No Transportation Needs (09/13/2021)   PRAPARE - Administrator, Civil Service (Medical): No    Lack of Transportation (Non-Medical): No  Physical Activity: Sufficiently Active (12/11/2021)   Exercise Vital Sign    Days of Exercise per Week: 5 days    Minutes of Exercise per Session: 60 min  Stress: No Stress Concern Present (11/07/2021)   [employer] of Occupational Health - Occupational Stress Questionnaire    Feeling of Stress : Only a little  Social Connections: Not on file    Allergies: No Known Allergies  Metabolic Disorder Labs: No results  found for: "HGBA1C", "MPG" No results found for: "PROLACTIN" No results found for: "CHOL", "TRIG", "HDL", "CHOLHDL", "VLDL", "LDLCALC" Lab Results  Component Value Date   TSH 2.145 03/18/2022   TSH 2.583 09/16/2021    Therapeutic Level Labs: No results found for: "LITHIUM" No results found for: "VALPROATE" No results found for: "CBMZ"  Current Medications: Current Outpatient Medications  Medication Sig Dispense Refill   buprenorphine-naloxone (SUBOXONE) 8-2 mg SUBL SL tablet Place 1 tablet under the tongue daily. Patient uses 8mg  film bid     meclizine (ANTIVERT) 25 MG tablet Take 1 tablet (25 mg total) by mouth 3 (three) times daily as needed for dizziness. 30 tablet 0   meloxicam (MOBIC) 15 MG tablet Take 15 mg by mouth daily as needed.     methocarbamol (ROBAXIN) 500 MG tablet Take by mouth.     pantoprazole (PROTONIX) 40 MG tablet Take 1 tablet by mouth daily.     valACYclovir (VALTREX) 500 MG tablet Take 1 tablet (500 mg total) by mouth daily. Can increase to twice a day for 5 days in the event of a recurrence 30 tablet 12   No current facility-administered medications for this visit.     Musculoskeletal: Strength & Muscle Tone:  N/A Gait & Station:  N/A Patient leans: N/A  Psychiatric Specialty Exam: Review of Systems  There were no vitals taken for this visit.There is no height or weight on file to calculate BMI.  General Appearance: {Appearance:22683}  Eye Contact:  {BHH EYE CONTACT:22684}  Speech:  Clear and Coherent  Volume:  Normal  Mood:  {BHH MOOD:22306}  Affect:  {Affect (PAA):22687}  Thought Process:  Coherent  Orientation:  Full (Time, Place, and Person)  Thought Content: Logical   Suicidal Thoughts:  {ST/HT (PAA):22692}  Homicidal Thoughts:  {ST/HT (PAA):22692}  Memory:  Immediate;   Good  Judgement:  {Judgement (PAA):22694}  Insight:  {Insight (PAA):22695}  Psychomotor Activity:  Normal  Concentration:  Concentration: Good and Attention Span: Good   Recall:  Good  Fund of Knowledge: Good  Language: Good  Akathisia:  No  Handed:  Right  AIMS (if indicated): not done  Assets:  Communication Skills Desire for Improvement  ADL's:  Intact  Cognition: WNL  Sleep:  {BHH GOOD/FAIR/POOR:22877}   Screenings: PHQ2-9    Flowsheet Row Video Visit from 12/05/2021 in [facility] Patient Outreach Telephone from 10/15/2021 in [facility]  PHQ-2 Total Score 1 4  PHQ-9 Total Score -- 10      Flowsheet Row ED from 06/27/2022 in [facility] from 06/18/2022 in [facility] from 06[facility]05/2022 in [facility]  C-SSRS RISK CATEGORY No Risk No Risk No Risk  Assessment and Plan:  PAITYNN OLINSKI is a 31 y.o. year old female with a history of depression, anxiety, history of opioid dependence on Suboxone, seizure, cervical spondylosis, r/o fibromyalgia , who presents for follow up appointment for below.     1. Recurrent major depressive disorder, in partial remission ([place]) 2. Anxiety state 3. Panic disorder There has been significant improvement in depressive symptoms, anxiety, panic attacks, and somatic symptoms of chest pain since taking sertraline consistently.  She continues to have great relationship with her family, including her daughter, parents and grandmother.  Will continue current dose of sertraline to target depression and anxiety.  Discussed potential risk of QTc prolongation and serotonin syndrome with concomitant use of Suboxone.  Noted that she was advised by the pharmacy to limit tyramine containing food; will contact the pharmacy to verify this.  Will hold lorazepam, quetiapine at this time given significant improvement in her symptoms.       # opioid dependence on Suboxone Unchanged. She has been on Suboxone for heroin dependence,  prescribed at [facility].  She has been abstinent for the past 4 years without any craving.  Will continue motivational interview.    Plan Continue sertraline 50 mg at night Discontinue quetiapine  Discontinue lorazepam Next appointment: 12/13 at 1:20 for 20 mins, video   Addendum: spoke with the pharmacist. He is not aware of any need/does not recommend food restriction, although that they are concerned about minor risk of QTc prolongation with concomitant use of Suboxone, quetiapine and sertraline.  Contacted the patient about this, and she verbalized understanding.    I have reviewed suicide assessment in detail. No change in the following assessment.    The patient demonstrates the following risk factors for suicide: Chronic risk factors for suicide include: psychiatric disorder of depression . Acute risk factors for suicide include: N/A. Protective factors for this patient include: positive social support, responsibility to others (children, family), and hope for the future. Considering these factors, the overall suicide risk at this point appears to be low. Patient is appropriate for outpatient follow up.              Collaboration of Care: Collaboration of Care: {BH OP Collaboration of Care:21014065}  Patient/Guardian was advised Release of Information must be obtained prior to any record release in order to collaborate their care with an outside provider. Patient/Guardian was advised if they have not already done so to contact the registration department to sign all necessary forms in order for [place] to release information regarding their care.   Consent: Patient/Guardian gives verbal consent for treatment and assignment of benefits for services provided during this visit. Patient/Guardian expressed understanding and agreed to proceed.    Norman Clay, MD 07/08/2022, 12:14 PM

## 2022-07-10 ENCOUNTER — Telehealth: Payer: Medicaid Other | Admitting: Psychiatry

## 2022-07-10 ENCOUNTER — Telehealth: Payer: Self-pay | Admitting: Psychiatry

## 2022-07-10 NOTE — Telephone Encounter (Signed)
Sent link for video visit through Epic. Patient did not sign in. Called the patient for appointment scheduled today. The patient did not answer the phone. No option to leave a voice message.  

## 2022-08-07 ENCOUNTER — Emergency Department
Admission: EM | Admit: 2022-08-07 | Discharge: 2022-08-07 | Disposition: A | Discharge status: Home / Self Care | Payer: Medicaid Other | Attending: Emergency Medicine | Admitting: Emergency Medicine

## 2022-08-07 ENCOUNTER — Other Ambulatory Visit: Payer: Self-pay

## 2022-08-07 DIAGNOSIS — N7689 Other specified inflammation of vagina and vulva: Secondary | ICD-10-CM | POA: Insufficient documentation

## 2022-08-07 DIAGNOSIS — N9089 Other specified noninflammatory disorders of vulva and perineum: Secondary | ICD-10-CM

## 2022-08-07 DIAGNOSIS — N3000 Acute cystitis without hematuria: Secondary | ICD-10-CM | POA: Diagnosis not present

## 2022-08-07 DIAGNOSIS — R300 Dysuria: Secondary | ICD-10-CM | POA: Diagnosis present

## 2022-08-07 LAB — URINALYSIS, ROUTINE W REFLEX MICROSCOPIC
Bilirubin Urine: NEGATIVE
Glucose, UA: NEGATIVE mg/dL
Hgb urine dipstick: NEGATIVE
Ketones, ur: NEGATIVE mg/dL
Nitrite: NEGATIVE
Protein, ur: 30 mg/dL — AB
Specific Gravity, Urine: 1.029 (ref 1.005–1.030)
pH: 5 (ref 5.0–8.0)

## 2022-08-07 LAB — POC URINE PREG, ED: Preg Test, Ur: NEGATIVE

## 2022-08-07 MED ORDER — HYDROCORTISONE 1 % EX LOTN: 1.0000 | TOPICAL_LOTION | Freq: Two times a day (BID) | CUTANEOUS | 0 refills | Status: DC

## 2022-08-07 MED ORDER — CEPHALEXIN 500 MG PO CAPS: 500.0000 mg | ORAL_CAPSULE | Freq: Three times a day (TID) | ORAL | 0 refills | Status: AC

## 2022-08-07 NOTE — ED Provider Notes (Signed)
[facility] Provider Note    Event Date/Time   First MD Initiated Contact with Patient 08/07/22 2023     (approximate)   History   Dysuria and Groin Swelling   HPI  Kendra [name] is a 32 y.o. female with history of anxiety, depression, cyclic vomiting, headache and as listed in the EMR presents to the emergency department for treatment and evaluation of uti symptoms and feeling like something is protruding from her vagina.  She has not felt a bulge upon standing.  Symptoms started yesterday but have progressively worsened today.  She reports having intercourse a few days ago and using a "new toy." She's not sure if this is related to current symptoms.     Physical Exam   Triage Vital Signs: ED Triage Vitals [08/07/22 1842]  Enc Vitals Group     BP (!) 136/95     Pulse Rate 96     Resp 16     Temp 97.8 F (36.6 C)     Temp Source Oral     SpO2 98 %     Weight 220 lb (99.8 kg)     Height 5\' 7"  (1.702 m)     Head Circumference      Peak Flow      Pain Score      Pain Loc      Pain Edu?      Excl. in [place]?     Most recent vital signs: Vitals:   08/07/22 1842  BP: (!) 136/95  Pulse: 96  Resp: 16  Temp: 97.8 F (36.6 C)  SpO2: 98%     General: Awake, no distress.  CV:  Good peripheral perfusion.  Resp:  Normal effort.  Abd:  No distention.  Other:  Labia minor edematous without lesions or wounds. No uterine prolapse. Cervix closed. No cervical motion tenderness. No discharge or bleeding.   ED Results / Procedures / Treatments   Labs (all labs ordered are listed, but only abnormal results are displayed) Labs Reviewed  URINALYSIS, ROUTINE W REFLEX MICROSCOPIC - Abnormal; Notable for the following components:      Result Value   Color, Urine YELLOW (*)    APPearance HAZY (*)    Protein, ur 30 (*)    Leukocytes,Ua SMALL (*)    Bacteria, UA RARE (*)    All other components within normal limits  POC URINE PREG, ED      EKG  Not indicated.   RADIOLOGY  Not indicated.  PROCEDURES:  Critical Care performed: No  Procedures   MEDICATIONS ORDERED IN ED: Medications - No data to display   IMPRESSION / MDM / [place] / ED COURSE  I reviewed the triage vital signs and the nursing notes.                              Differential diagnosis includes, but is not limited to, acute cystitis, vaginal irritation, uterine prolapse  Patient's presentation is most consistent with acute illness / injury with system symptoms.  32 year old female presenting to the emergency department for treatment and evaluation of pain with urination today and sensation of something protruding from her vagina.  Urinalysis is consistent with acute cystitis.  Vaginal exam shows edematous labia minora without any wounds or lesions.  No uterine prolapse.  Plan will be to treat with antibiotics and hydrocortisone lotion.  She was instructed to only apply the  lotion to the external surface of the vagina and labia.  She was also instructed to call and schedule follow-up appointment with gynecology especially if symptoms or not improving over the next few days.     FINAL CLINICAL IMPRESSION(S) / ED DIAGNOSES   Final diagnoses:  Labial irritation  Acute cystitis without hematuria     Rx / DC Orders   ED Discharge Orders          Ordered    cephALEXin (KEFLEX) 500 MG capsule  3 times daily        08/07/22 2040    hydrocortisone 1 % lotion  2 times daily        08/07/22 2040             Note:  This document was prepared using Dragon voice recognition software and may include unintentional dictation errors.   Victorino Dike, FNP 08/07/22 2052    Lucillie Garfinkel, MD 08/08/22 2330

## 2022-08-07 NOTE — ED Triage Notes (Signed)
Pt states that she started having painful urination today and thinks her urethra is swollen and believes that tissue inside her vagina is swollen and protruding out

## 2022-08-07 NOTE — Discharge Instructions (Addendum)
Use the hydrocortisone lotion on the external surface of the vagina and labia.  Follow up with the gynecologist.

## 2022-08-13 ENCOUNTER — Other Ambulatory Visit: Payer: Self-pay

## 2022-08-13 ENCOUNTER — Emergency Department
Admission: EM | Admit: 2022-08-13 | Discharge: 2022-08-13 | Disposition: A | Discharge status: Home / Self Care | Payer: Medicaid Other | Attending: Emergency Medicine | Admitting: Emergency Medicine

## 2022-08-13 ENCOUNTER — Encounter: Payer: Self-pay | Admitting: Emergency Medicine

## 2022-08-13 DIAGNOSIS — A6000 Herpesviral infection of urogenital system, unspecified: Secondary | ICD-10-CM

## 2022-08-13 DIAGNOSIS — N898 Other specified noninflammatory disorders of vagina: Secondary | ICD-10-CM | POA: Diagnosis present

## 2022-08-13 DIAGNOSIS — B009 Herpesviral infection, unspecified: Secondary | ICD-10-CM | POA: Insufficient documentation

## 2022-08-13 LAB — CHLAMYDIA/NGC RT PCR (ARMC ONLY)
Chlamydia Tr: NOT DETECTED
N gonorrhoeae: NOT DETECTED

## 2022-08-13 LAB — URINALYSIS, ROUTINE W REFLEX MICROSCOPIC
Bilirubin Urine: NEGATIVE
Glucose, UA: NEGATIVE mg/dL
Hgb urine dipstick: NEGATIVE
Ketones, ur: NEGATIVE mg/dL
Leukocytes,Ua: NEGATIVE
Nitrite: NEGATIVE
Protein, ur: NEGATIVE mg/dL
Specific Gravity, Urine: 1.027 (ref 1.005–1.030)
pH: 5 (ref 5.0–8.0)

## 2022-08-13 LAB — WET PREP, GENITAL
Clue Cells Wet Prep HPF POC: NONE SEEN
Sperm: NONE SEEN
Trich, Wet Prep: NONE SEEN
WBC, Wet Prep HPF POC: NONE SEEN — AB (ref <10)
Yeast Wet Prep HPF POC: NONE SEEN

## 2022-08-13 NOTE — ED Provider Notes (Signed)
[facility] Provider Note    Event Date/Time   First MD Initiated Contact with Patient 08/13/22 [phone]     (approximate)   History   Vaginal Discharge   HPI  Kendra [name] is a 32 y.o. female with history of genital herpes, anxiety, seizures presents emergency department with concerns of vaginal irritation and swelling.  Patient was seen here on 08/07/2022 for same symptoms.  Was treated for UTI.  Patient states a speculum exam did not show any abnormalities at that time.      Physical Exam   Triage Vital Signs: ED Triage Vitals  Enc Vitals Group     BP 08/13/22 0845 126/82     Pulse Rate 08/13/22 0845 97     Resp 08/13/22 0845 16     Temp 08/13/22 0845 99.6 F (37.6 C)     Temp Source 08/13/22 0845 Oral     SpO2 08/13/22 0845 98 %     Weight 08/13/22 0850 219 lb 12.8 oz (99.7 kg)     Height 08/13/22 0850 5\' 7"  (1.702 m)     Head Circumference --      Peak Flow --      Pain Score 08/13/22 0850 0     Pain Loc --      Pain Edu? --      Excl. in [place]? --     Most recent vital signs: Vitals:   08/13/22 0845  BP: 126/82  Pulse: 97  Resp: 16  Temp: 99.6 F (37.6 C)  SpO2: 98%     General: Awake, no distress.   CV:  Good peripheral perfusion. regular rate and  rhythm Resp:  Normal effort.  Abd:  No distention.   Other:  External vaginal exam showed suprapubic lesion and a ingrown hair, slight discharge noted, no odor,   ED Results / Procedures / Treatments   Labs (all labs ordered are listed, but only abnormal results are displayed) Labs Reviewed  WET PREP, GENITAL - Abnormal; Notable for the following components:      Result Value   WBC, Wet Prep HPF POC NONE SEEN (*)    All other components within normal limits  URINALYSIS, ROUTINE W REFLEX MICROSCOPIC - Abnormal; Notable for the following components:   Color, Urine YELLOW (*)    APPearance HAZY (*)    All other components within normal limits  CHLAMYDIA/NGC RT PCR ([facility]  ONLY)               EKG     RADIOLOGY     PROCEDURES:   Procedures   MEDICATIONS ORDERED IN ED: Medications - No data to display   IMPRESSION / MDM / [place] / ED COURSE  I reviewed the triage vital signs and the nursing notes.                              Differential diagnosis includes, but is not limited to, herpes, bacterial vaginosis, candidiasis  Patient's presentation is most consistent with acute complicated illness / injury requiring diagnostic workup.   Urinalysis is normal showing that her infection has cleared, wet prep is normal, GC/committee is still processing so we will notify the patient if results are positive.  I do feel that she is having a herpes outbreak.  She started her regular medications.  Follow-up with her regular doctor.  She is in agreement with this treatment plan.  Discharged stable condition.      FINAL CLINICAL IMPRESSION(S) / ED DIAGNOSES   Final diagnoses:  Vaginal discharge  Herpes simplex infection of genitourinary system     Rx / DC Orders   ED Discharge Orders     None        Note:  This document was prepared using Dragon voice recognition software and may include unintentional dictation errors.    Versie Starks, PA-C 08/13/22 [place], [place], MD 08/14/22 [phone]

## 2022-08-13 NOTE — ED Triage Notes (Signed)
Pt present to the ED via POV due to vaginal swelling. Pt was recently seen here on the 10th for the same complaint. Pt states she is taking her prescribed medications but it is not getting better. Pt states she is only uncomrtable when she urinates. Pt A&Ox4

## 2022-08-13 NOTE — ED Notes (Signed)
See triage note  Presents with some pain and swelling to vaginal area  States she was seen last week for a protrusion to her labia  States sx's are worse

## 2022-08-13 NOTE — Discharge Instructions (Signed)
Take a probiotic daily to help with vaginal health.  This would help keep the balance of the floor that are naturally in your vagina.  Start taking your regular medications for your outbreak.  Follow-up with your doctor on Friday as originally scheduled

## 2022-08-14 ENCOUNTER — Telehealth: Payer: Self-pay | Admitting: Obstetrics and Gynecology

## 2022-08-14 NOTE — Telephone Encounter (Addendum)
I contacted patient via phone x. Dr. Marcelline Mates will be in surgery and I left message for patient to calll back to confirm rescheduled appointment to Dr. Amalia Hailey 1/18 at 8:30

## 2022-08-15 ENCOUNTER — Ambulatory Visit: Payer: Medicaid Other | Admitting: Obstetrics and Gynecology

## 2022-08-16 ENCOUNTER — Ambulatory Visit: Payer: Medicaid Other | Admitting: Obstetrics and Gynecology

## 2022-08-19 ENCOUNTER — Encounter: Payer: Self-pay | Admitting: Obstetrics and Gynecology

## 2022-08-19 ENCOUNTER — Encounter: Payer: Self-pay | Admitting: Advanced Practice Midwife

## 2022-08-19 ENCOUNTER — Other Ambulatory Visit (HOSPITAL_COMMUNITY)
Admission: RE | Admit: 2022-08-19 | Discharge: 2022-08-19 | Disposition: A | Discharge status: Home / Self Care | Payer: Medicaid Other | Source: Ambulatory Visit | Attending: Advanced Practice Midwife | Admitting: Advanced Practice Midwife

## 2022-08-19 ENCOUNTER — Ambulatory Visit (INDEPENDENT_AMBULATORY_CARE_PROVIDER_SITE_OTHER): Payer: Medicaid Other | Admitting: Advanced Practice Midwife

## 2022-08-19 VITALS — BP 125/83 | HR 90 | Resp 16 | Wt 236.9 lb

## 2022-08-19 DIAGNOSIS — N9489 Other specified conditions associated with female genital organs and menstrual cycle: Secondary | ICD-10-CM

## 2022-08-19 DIAGNOSIS — N898 Other specified noninflammatory disorders of vagina: Secondary | ICD-10-CM

## 2022-08-19 DIAGNOSIS — B009 Herpesviral infection, unspecified: Secondary | ICD-10-CM | POA: Insufficient documentation

## 2022-08-19 DIAGNOSIS — R300 Dysuria: Secondary | ICD-10-CM | POA: Diagnosis not present

## 2022-08-19 LAB — POCT URINALYSIS DIPSTICK
Bilirubin, UA: NEGATIVE
Blood, UA: NEGATIVE
Glucose, UA: NEGATIVE
Ketones, UA: NEGATIVE
Leukocytes, UA: NEGATIVE
Nitrite, UA: NEGATIVE
Protein, UA: POSITIVE — AB
Spec Grav, UA: <=1.005 — AB (ref 1.010–1.025)
Urobilinogen, UA: 0.2 E.U./dL
pH, UA: 8 (ref 5.0–8.0)

## 2022-08-19 NOTE — Patient Instructions (Signed)

## 2022-08-19 NOTE — Progress Notes (Signed)
Patient ID: Kendra [name], female   DOB: [birth year], 32 y.o.   MRN: [id number]  Reason for Visit: ER follow up (Patient was seen at [facility] ED on 08/13/22 with complaints of vaginal discharge, examination showed signs of suprapubic lesion and patient was diagnosed with Herpes.)   Subjective:  HPI:  Kendra [name] is a 32 y.o. female being seen following ER visit 6 days ago. At that time she had vulvar swelling and a herpes lesion was also noted. She denies vaginal itching or irritation. Discharge is thin/white. She does have some discomfort with urination. She was treated for UTI based on UA results. She reports the swelling has improved, however, it was concerning to her and she wonders what would have caused her vulva to become so swollen. She did take a picture which does show generalized vulvar swelling. 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 She mentions the swelling was present prior to the outbreak she just had. She denies any significant changes in activity or body care products leading up to the episode of swelling.   Past Medical History:  Diagnosis Date   Anxiety    Depression    GERD (gastroesophageal reflux disease)    Headache    stress   Heart murmur    History of cannabis abuse    Seizures ([place])    age 32. syncopal episode. Hit head. had seizure.    Tobacco abuse    Vitamin D deficiency    Family History  Problem Relation Age of Onset   Depression Mother    Diabetes Maternal Grandfather    Congestive Heart Failure Maternal Grandfather    Diverticulitis Maternal Grandmother    Dementia Maternal Grandmother    Past Surgical History:  Procedure Laterality Date   COLON SURGERY  2013   pt states "colonoscopy while awake"   COLONOSCOPY WITH PROPOFOL N/A 12/14/2015   Procedure: COLONOSCOPY WITH PROPOFOL;  Surgeon: Lucilla Lame, MD;  Location: [place];  Service: Endoscopy;  Laterality: N/A;   ESOPHAGOGASTRODUODENOSCOPY (EGD) WITH PROPOFOL  N/A 12/14/2015   Procedure: ESOPHAGOGASTRODUODENOSCOPY (EGD) WITH PROPOFOL;  Surgeon: Lucilla Lame, MD;  Location: [place];  Service: Endoscopy;  Laterality: N/A;   ESOPHAGOGASTRODUODENOSCOPY (EGD) WITH PROPOFOL N/A 02/21/2022   Procedure: ESOPHAGOGASTRODUODENOSCOPY (EGD) WITH BIOPSY;  Surgeon: Lin Landsman, MD;  Location: [place];  Service: Endoscopy;  Laterality: N/A;   LAPAROSCOPIC TUBAL LIGATION Bilateral 12/03/2021   Procedure: LAPAROSCOPIC TUBAL LIGATION;  Surgeon: Harlin Heys, MD;  Location: [facility];  Service: Gynecology;  Laterality: Bilateral;   WISDOM TOOTH EXTRACTION  2013    Short Social History:  Social History   Tobacco Use   Smoking status: Every Day    Packs/day: 1.00    Years: 8.00    Total pack years: 8.00    Types: Cigarettes   Smokeless tobacco: Never  Substance Use Topics   Alcohol use: No    No Known Allergies  Current Outpatient Medications  Medication Sig Dispense Refill   buprenorphine-naloxone (SUBOXONE) 8-2 mg SUBL SL tablet Place 1 tablet under the tongue daily. Patient uses 8mg  film bid     hydrocortisone 1 % lotion Apply 1 Application topically 2 (two) times daily. 118 mL 0   meclizine (ANTIVERT) 25 MG tablet Take 1 tablet (25 mg total) by mouth 3 (three) times daily as needed for dizziness. 30 tablet 0   meloxicam (MOBIC) 15 MG tablet Take 15 mg by mouth daily as  needed.     methocarbamol (ROBAXIN) 500 MG tablet Take by mouth.     valACYclovir (VALTREX) 500 MG tablet Take 1 tablet (500 mg total) by mouth daily. Can increase to twice a day for 5 days in the event of a recurrence 30 tablet 12   pantoprazole (PROTONIX) 40 MG tablet Take 1 tablet by mouth daily.     No current facility-administered medications for this visit.   Review of Systems  Constitutional:  Negative for chills and fever.  HENT:  Negative for congestion, ear discharge, ear pain, hearing loss, sinus pain and sore throat.   Eyes:  Negative for  blurred vision and double vision.  Respiratory:  Negative for cough, shortness of breath and wheezing.   Cardiovascular:  Negative for chest pain, palpitations and leg swelling.  Gastrointestinal:  Negative for abdominal pain, blood in stool, constipation, diarrhea, heartburn, melena, nausea and vomiting.  Genitourinary:  Positive for dysuria. Negative for flank pain, frequency, hematuria and urgency.       Positive for vulvar swelling  Musculoskeletal:  Negative for back pain, joint pain and myalgias.  Skin:  Negative for itching and rash.  Neurological:  Negative for dizziness, tingling, tremors, sensory change, speech change, focal weakness, seizures, loss of consciousness, weakness and headaches.  Endo/Heme/Allergies:  Negative for environmental allergies. Does not bruise/bleed easily.  Psychiatric/Behavioral:  Negative for depression, hallucinations, memory loss, substance abuse and suicidal ideas. The patient is not nervous/anxious and does not have insomnia.        Objective:  Objective   Vitals:   08/19/22 1047  BP: 125/83  Pulse: 90  Resp: 16  Weight: 236 lb 14.4 oz (107.5 kg)   Body mass index is 37.1 kg/m. Constitutional: Well nourished, well developed female in no acute distress.  HEENT: normal Skin: Warm and dry.  Cardiovascular: Regular rate and rhythm.   Extremity:  no edema   Respiratory: Clear to auscultation bilateral. Normal respiratory effort Psych: Alert and Oriented x3. No memory deficits. Normal mood and affect.   Pelvic exam:  is not limited by body habitus EGBUS: Excoriated area near urethra and generally red at introitus. No swelling Vagina: within normal limits and with normal mucosa  Cervix: not evaluated   Data:  Latest Reference Range & Units 08/19/22 11:14  Bilirubin, UA  negative  Clarity, UA  clear  Color, UA  yellow  Glucose Negative  Negative  Ketones, UA  negative  Leukocytes,UA Negative  Negative  Nitrite, UA  negative  pH, UA 5.0  - 8.0  8.0  Protein,UA Negative  Positive !  Specific Gravity, UA 1.010 - 1.025  <=1.005 !  Urobilinogen, UA 0.2 or 1.0 E.U./dL 0.2  RBC, UA  negative  !: Data is abnormal     Assessment/Plan:     32 y.o. G2 P18 female with raw areas of skin around vaginal introitus  Recommend sea salt soaks followed by barrier of coconut oil to the affected areas Aptima: vaginitis  Follow up as needed after lab results   [facility] 08/19/2022, 1:07 PM

## 2022-08-20 LAB — CERVICOVAGINAL ANCILLARY ONLY
Bacterial Vaginitis (gardnerella): NEGATIVE
Candida Glabrata: NEGATIVE
Candida Vaginitis: NEGATIVE
Comment: NEGATIVE
Comment: NEGATIVE
Comment: NEGATIVE

## 2022-09-08 ENCOUNTER — Emergency Department
Admission: EM | Admit: 2022-09-08 | Discharge: 2022-09-08 | Disposition: A | Discharge status: Home / Self Care | Payer: Medicaid Other | Attending: Student in an Organized Health Care Education/Training Program | Admitting: Student in an Organized Health Care Education/Training Program

## 2022-09-08 ENCOUNTER — Other Ambulatory Visit: Payer: Self-pay

## 2022-09-08 ENCOUNTER — Emergency Department: Payer: Medicaid Other

## 2022-09-08 DIAGNOSIS — M25512 Pain in left shoulder: Secondary | ICD-10-CM | POA: Insufficient documentation

## 2022-09-08 DIAGNOSIS — N644 Mastodynia: Secondary | ICD-10-CM | POA: Diagnosis not present

## 2022-09-08 DIAGNOSIS — R0789 Other chest pain: Secondary | ICD-10-CM | POA: Insufficient documentation

## 2022-09-08 LAB — CBC
HCT: 38.5 % (ref 36.0–46.0)
Hemoglobin: 13.1 g/dL (ref 12.0–15.0)
MCH: 33.5 pg (ref 26.0–34.0)
MCHC: 34 g/dL (ref 30.0–36.0)
MCV: 98.5 fL (ref 80.0–100.0)
Platelets: 261 10*3/uL (ref 150–400)
RBC: 3.91 MIL/uL (ref 3.87–5.11)
RDW: 11.9 % (ref 11.5–15.5)
WBC: 8.3 10*3/uL (ref 4.0–10.5)
nRBC: 0 % (ref 0.0–0.2)

## 2022-09-08 LAB — BASIC METABOLIC PANEL
Anion gap: 6 (ref 5–15)
BUN: 10 mg/dL (ref 6–20)
CO2: 26 mmol/L (ref 22–32)
Calcium: 9.2 mg/dL (ref 8.9–10.3)
Chloride: 105 mmol/L (ref 98–111)
Creatinine, Ser: 0.75 mg/dL (ref 0.44–1.00)
GFR, Estimated: >60 mL/min (ref >60)
Glucose, Bld: 87 mg/dL (ref 70–99)
Potassium: 3.7 mmol/L (ref 3.5–5.1)
Sodium: 137 mmol/L (ref 135–145)

## 2022-09-08 LAB — TROPONIN I (HIGH SENSITIVITY): Troponin I (High Sensitivity): <2 ng/L (ref <18)

## 2022-09-08 NOTE — ED Triage Notes (Addendum)
Pt states chest pain, breast pain and left arm pain for the last two weeks. Pt states the chest pain has subsided mostly. Pt states sharp, pressure pain. Pt states red dots on arm as well

## 2022-09-08 NOTE — ED Provider Notes (Signed)
[facility] Provider Note    Event Date/Time   First MD Initiated Contact with Patient 09/08/22 1502     (approximate)   History   Chest Pain   HPI  Kendra [name] is a 32 y.o. female presents to the ER for evaluation of left breast pain shoulder pain.  Denies any shortness of breath.  She is concerned was evaluated because she has family history of breast cancer.  Denies any nausea or vomiting.  No fevers or chills.  Also concerned about a rash on her arm that she says she is worried could be petechia.  No family or personal history of bleeding disorder.     Physical Exam   Triage Vital Signs: ED Triage Vitals  Enc Vitals Group     BP 09/08/22 1455 131/72     Pulse Rate 09/08/22 1455 89     Resp 09/08/22 1455 17     Temp 09/08/22 1455 98.3 F (36.8 C)     Temp Source 09/08/22 1455 Oral     SpO2 09/08/22 1455 96 %     Weight 09/08/22 1454 220 lb (99.8 kg)     Height 09/08/22 1454 5' 7"$  (1.702 m)     Head Circumference --      Peak Flow --      Pain Score 09/08/22 1454 6     Pain Loc --      Pain Edu? --      Excl. in [place]? --     Most recent vital signs: Vitals:   09/08/22 1455  BP: 131/72  Pulse: 89  Resp: 17  Temp: 98.3 F (36.8 C)  SpO2: 96%     Constitutional: Alert  Eyes: Conjunctivae are normal.  Head: Atraumatic. Nose: No congestion/rhinnorhea. Mouth/Throat: Mucous membranes are moist.   Neck: Painless ROM.  Cardiovascular:   Good peripheral circulation. Respiratory: Normal respiratory effort.  No retractions.  Gastrointestinal: Soft and nontender.  Musculoskeletal:  no deformity, chaperoned by NT breast exam without masses, cellulitis or abscess. Neurologic:  MAE spontaneously. No gross focal neurologic deficits are appreciated.  Skin:  Skin is warm, dry and intact. No rash noted.  No petechia Psychiatric: Mood and affect are normal. Speech and behavior are normal.    ED Results / Procedures / Treatments    Labs (all labs ordered are listed, but only abnormal results are displayed) Labs Reviewed  BASIC METABOLIC PANEL  CBC  POC URINE PREG, ED  TROPONIN I (HIGH SENSITIVITY)     EKG ED ECG REPORT I, Merlyn Lot, the attending physician, personally viewed and interpreted this ECG.   Date: 09/08/2022  EKG Time: 14:51  Rate: 90  Rhythm: sinus  Axis: normal  Intervals: normal  ST&T Change: no stemi, no depressions     RADIOLOGY Please see ED Course for my review and interpretation.  I personally reviewed all radiographic images ordered to evaluate for the above acute complaints and reviewed radiology reports and findings.  These findings were personally discussed with the patient.  Please see medical record for radiology report.    PROCEDURES:  Critical Care performed: No  Procedures   MEDICATIONS ORDERED IN ED: Medications - No data to display   IMPRESSION / MDM / [place] / ED COURSE  I reviewed the triage vital signs and the nursing notes.  Differential diagnosis includes, but is not limited to, ACS, pneumonia, skeletal strain, rash, petechia, abscess, cellulitis, mastitis, mass  Patient presenting to the ER for evaluation of symptoms as described above.  Based on symptoms, risk factors and considered above differential, this presenting complaint could reflect a potentially life-threatening illness therefore the patient will be placed on continuous pulse oximetry and telemetry for monitoring.  Laboratory evaluation will be sent to evaluate for the above complaints.  EKG is nonischemic.  Troponin negative.  Chest x-ray on my review and interpretation without evidence of pneumothorax or consolidation.  Not consistent with PE.  Exam reassuring.  Patient stable and appropriate for outpatient follow-up.      FINAL CLINICAL IMPRESSION(S) / ED DIAGNOSES   Final diagnoses:  Atypical chest pain     Rx / DC Orders   ED  Discharge Orders     None        Note:  This document was prepared using Dragon voice recognition software and may include unintentional dictation errors.    Merlyn Lot, MD 09/08/22 1535

## 2022-09-08 NOTE — Discharge Instructions (Signed)
All of your blood work including troponin and platelet levels were normal.  Your chest x-ray is reassuring.  Please follow-up with primary care doctor to discuss further imaging with your family history of breast cancer.

## 2022-10-07 ENCOUNTER — Other Ambulatory Visit: Payer: Self-pay

## 2022-10-07 ENCOUNTER — Emergency Department: Payer: Medicaid Other

## 2022-10-07 ENCOUNTER — Emergency Department
Admission: EM | Admit: 2022-10-07 | Discharge: 2022-10-07 | Disposition: A | Discharge status: Home / Self Care | Payer: Medicaid Other | Attending: Emergency Medicine | Admitting: Emergency Medicine

## 2022-10-07 ENCOUNTER — Encounter: Payer: Self-pay | Admitting: *Deleted

## 2022-10-07 DIAGNOSIS — J029 Acute pharyngitis, unspecified: Secondary | ICD-10-CM | POA: Insufficient documentation

## 2022-10-07 DIAGNOSIS — R059 Cough, unspecified: Secondary | ICD-10-CM | POA: Diagnosis not present

## 2022-10-07 DIAGNOSIS — J3489 Other specified disorders of nose and nasal sinuses: Secondary | ICD-10-CM | POA: Diagnosis not present

## 2022-10-07 DIAGNOSIS — R0981 Nasal congestion: Secondary | ICD-10-CM | POA: Insufficient documentation

## 2022-10-07 LAB — BASIC METABOLIC PANEL
Anion gap: 8 (ref 5–15)
BUN: 10 mg/dL (ref 6–20)
CO2: 26 mmol/L (ref 22–32)
Calcium: 9.1 mg/dL (ref 8.9–10.3)
Chloride: 102 mmol/L (ref 98–111)
Creatinine, Ser: 0.84 mg/dL (ref 0.44–1.00)
GFR, Estimated: >60 mL/min (ref >60)
Glucose, Bld: 105 mg/dL — ABNORMAL HIGH (ref 70–99)
Potassium: 3.8 mmol/L (ref 3.5–5.1)
Sodium: 136 mmol/L (ref 135–145)

## 2022-10-07 LAB — CBC
HCT: 38.8 % (ref 36.0–46.0)
Hemoglobin: 13.3 g/dL (ref 12.0–15.0)
MCH: 33.7 pg (ref 26.0–34.0)
MCHC: 34.3 g/dL (ref 30.0–36.0)
MCV: 98.2 fL (ref 80.0–100.0)
Platelets: 249 10*3/uL (ref 150–400)
RBC: 3.95 MIL/uL (ref 3.87–5.11)
RDW: 11.7 % (ref 11.5–15.5)
WBC: 4.8 10*3/uL (ref 4.0–10.5)
nRBC: 0 % (ref 0.0–0.2)

## 2022-10-07 LAB — GROUP A STREP BY PCR: Group A Strep by PCR: NOT DETECTED

## 2022-10-07 LAB — POC URINE PREG, ED: Preg Test, Ur: NEGATIVE

## 2022-10-07 LAB — TROPONIN I (HIGH SENSITIVITY): Troponin I (High Sensitivity): <2 ng/L (ref <18)

## 2022-10-07 MED ORDER — LIDOCAINE VISCOUS HCL 2 % MT SOLN: 15.0000 mL | OROMUCOSAL | 0 refills | Status: AC | PRN

## 2022-10-07 NOTE — ED Triage Notes (Signed)
Pt has sore throat for 2 days.  Chest pain also began today.  Pain in left chest and upper back   no cough.  No fever  pt alert  speech clear.

## 2022-10-07 NOTE — ED Provider Notes (Signed)
[facility] Provider Note  Patient Contact: 8:35 PM (approximate)   History   Sore Throat and Chest Pain   HPI  Kendra [name] is a 32 y.o. female presents to the emergency department with pharyngitis, rhinorrhea, nasal congestion and nonproductive cough.  Patient also reports that she experiences occasional chest discomfort which she has experienced in the past and has been worked up for numerous times.  No new or atypical chest pain.  No nausea, vomiting or abdominal pain.      Physical Exam   Triage Vital Signs: ED Triage Vitals  Enc Vitals Group     BP 10/07/22 1602 (!) 124/91     Pulse Rate 10/07/22 1602 88     Resp 10/07/22 1602 18     Temp 10/07/22 1602 97.8 F (36.6 C)     Temp Source 10/07/22 1602 Oral     SpO2 10/07/22 1602 100 %     Weight 10/07/22 1557 220 lb (99.8 kg)     Height 10/07/22 1557 '5\' 7"'$  (1.702 m)     Head Circumference --      Peak Flow --      Pain Score 10/07/22 1557 8     Pain Loc --      Pain Edu? --      Excl. in [place]? --     Most recent vital signs: Vitals:   10/07/22 1602 10/07/22 1723  BP: (!) 124/91 118/89  Pulse: 88 80  Resp: 18 18  Temp: 97.8 F (36.6 C)   SpO2: 100% 99%     Constitutional: Alert and oriented. Patient is lying supine. Eyes: Conjunctivae are normal. PERRL. EOMI. Head: Atraumatic. ENT:      Ears: Tympanic membranes are mildly injected with mild effusion bilaterally.       Nose: No congestion/rhinnorhea.      Mouth/Throat: Mucous membranes are moist. Posterior pharynx is mildly erythematous.  Hematological/Lymphatic/Immunilogical: No cervical lymphadenopathy.  Cardiovascular: Normal rate, regular rhythm. Normal S1 and S2.  Good peripheral circulation. Respiratory: Normal respiratory effort without tachypnea or retractions. Lungs CTAB. Good air entry to the bases with no decreased or absent breath sounds. Gastrointestinal: Bowel sounds 4 quadrants. Soft and nontender to  palpation. No guarding or rigidity. No palpable masses. No distention. No CVA tenderness. Musculoskeletal: Full range of motion to all extremities. No gross deformities appreciated. Neurologic:  Normal speech and language. No gross focal neurologic deficits are appreciated.  Skin:  Skin is warm, dry and intact. No rash noted. Psychiatric: Mood and affect are normal. Speech and behavior are normal. Patient exhibits appropriate insight and judgement.   ED Results / Procedures / Treatments   Labs (all labs ordered are listed, but only abnormal results are displayed) Labs Reviewed  BASIC METABOLIC PANEL - Abnormal; Notable for the following components:      Result Value   Glucose, Bld 105 (*)    All other components within normal limits  GROUP A STREP BY PCR  CBC  POC URINE PREG, ED  TROPONIN I (HIGH SENSITIVITY)  TROPONIN I (HIGH SENSITIVITY)       RADIOLOGY  I personally viewed and evaluated these images as part of my medical decision making, as well as reviewing the written report by the radiologist.  ED Provider Interpretation: No acute abnormality.    PROCEDURES:  Critical Care performed: No  Procedures   MEDICATIONS ORDERED IN ED: Medications - No data to display   IMPRESSION / MDM / ASSESSMENT AND  PLAN / ED COURSE  I reviewed the triage vital signs and the nursing notes.                              Assessment and plan: Pharyngitis 32 year old female presents to the emergency department with viral URI-like symptoms.  Vital signs are reassuring at triage.  On exam, patient was alert, active and nontoxic-appearing.  CBC, BMP and troponin within range.  Group A strep testing negative.  Suspect unspecified viral infection.  Supportive measures were encouraged for home use.  All patient questions were answered.      FINAL CLINICAL IMPRESSION(S) / ED DIAGNOSES   Final diagnoses:  Pharyngitis, unspecified etiology     Rx / DC Orders   ED Discharge Orders           Ordered    lidocaine (XYLOCAINE) 2 % solution  As needed        10/07/22 1742             Note:  This document was prepared using Dragon voice recognition software and may include unintentional dictation errors.   Vallarie Mare [place], PA-C 10/07/22 2352    Naaman Plummer, MD 10/08/22 0002

## 2022-10-07 NOTE — ED Notes (Signed)
Poct pregnancy Negative 

## 2022-10-08 ENCOUNTER — Emergency Department
Admission: EM | Admit: 2022-10-08 | Discharge: 2022-10-08 | Disposition: A | Discharge status: Home / Self Care | Payer: Medicaid Other | Attending: Emergency Medicine | Admitting: Emergency Medicine

## 2022-10-08 DIAGNOSIS — Z0130 Encounter for examination of blood pressure without abnormal findings: Secondary | ICD-10-CM

## 2022-10-08 DIAGNOSIS — R030 Elevated blood-pressure reading, without diagnosis of hypertension: Secondary | ICD-10-CM | POA: Insufficient documentation

## 2022-10-08 NOTE — Discharge Instructions (Addendum)
Your exam and lab are normal and reassuring at this time.  Your blood pressure is in the normal range at this time.  Continue to monitor and track your blood pressure.  Follow with primary provider if you feel the need for medication management.

## 2022-10-08 NOTE — ED Triage Notes (Signed)
Pt sts that she has been having bilateral leg pain with high blood pressure. Pt comes the ED with pulse ox on her finger and a bp reading that is elevated. Pt sts that this has never happened to her before.

## 2022-10-08 NOTE — ED Provider Notes (Signed)
[facility] Emergency Department Provider Note     Event Date/Time   First MD Initiated Contact with Patient 10/08/22 1549     (approximate)   History   Hypertension   HPI  Kendra [name] is a 32 y.o. female with history of anxiety, depression, ADHD, and reflux, presents to the ED noting elevated blood pressure readings yesterday.  Patient reports systolic blood pressure reading in the 0000000, and a diastolic reading in the high 80s.  She denies any frank chest pain, shortness of breath, cough, or syncope.  Patient also reports some bilateral anterior thigh pain.  She denies any history of hypertension denies any headache, vision loss, paralysis, weakness.  Physical Exam   Triage Vital Signs: ED Triage Vitals [10/08/22 1525]  Enc Vitals Group     BP 134/86     Pulse Rate 93     Resp 17     Temp 98 F (36.7 C)     Temp Source Oral     SpO2 97 %     Weight 220 lb (99.8 kg)     Height      Head Circumference      Peak Flow      Pain Score 6     Pain Loc      Pain Edu?      Excl. in [place]?     Most recent vital signs: Vitals:   10/08/22 1525 10/08/22 1609  BP: 134/86 131/81  Pulse: 93 95  Resp: 17   Temp: 98 F (36.7 C)   SpO2: 97% 96%    General Awake, no distress. NAD HEENT NCAT. PERRL. EOMI. No rhinorrhea. Mucous membranes are moist.  CV:  Good peripheral perfusion. RRR. Normal S1S2 RESP:  Normal effort. CTA ABD:  No distention.   ED Results / Procedures / Treatments   Labs (all labs ordered are listed, but only abnormal results are displayed) Labs Reviewed - No data to display   EKG   RADIOLOGY   PROCEDURES:  Critical Care performed: No  Procedures   MEDICATIONS ORDERED IN ED: Medications - No data to display   IMPRESSION / MDM / [place] / ED COURSE  I reviewed the triage vital signs and the nursing notes.                              Differential diagnosis includes, but is not limited to,  hypertension, elevated blood pressure reading, anxiety, myalgia  Patient's presentation is most consistent with acute complicated illness / injury requiring diagnostic workup.  Patient's diagnosis is consistent with elevated blood pressure reading without hypertension diagnosis.  Patient with a reassuring exam overall.  Patient with normal blood pressure readings throughout her course in the ED.  Patient is sure by her exam and normal readings.  Spent an amount of time discussing with the patient the fact that her blood pressure is dynamic, and her readings did not indicate hypertensive urgency or need for medical intervention at this time.  Patient is encouraged to continue to monitor blood pressure and log readings to follow with her primary provider. Patient is to follow up with primary provider as needed or otherwise directed. Patient is given ED precautions to return to the ED for any worsening or new symptoms.  FINAL CLINICAL IMPRESSION(S) / ED DIAGNOSES   Final diagnoses:  Blood pressure check     Rx / DC Orders  ED Discharge Orders     None        Note:  This document was prepared using Dragon voice recognition software and may include unintentional dictation errors.    Melvenia Needles, PA-C 10/08/22 Artist [place]is    Duffy Bruce, MD 10/10/22 [phone]

## 2022-12-01 ENCOUNTER — Encounter: Payer: Self-pay | Admitting: Obstetrics and Gynecology

## 2023-01-01 IMAGING — CR DG CHEST 2V
1 series · 2 of 2 positions shown · non-contrast
Comparison: 11/24/2018 [DATE]D chest x-ray and chest CT.

CLINICAL DATA: Syncopal episode this morning.

EXAM:
CHEST - 2 VIEW

[Series 1: dg chest 2 view · 0.14mm/px · 2 of 2 slices shown]
[im 1/2]
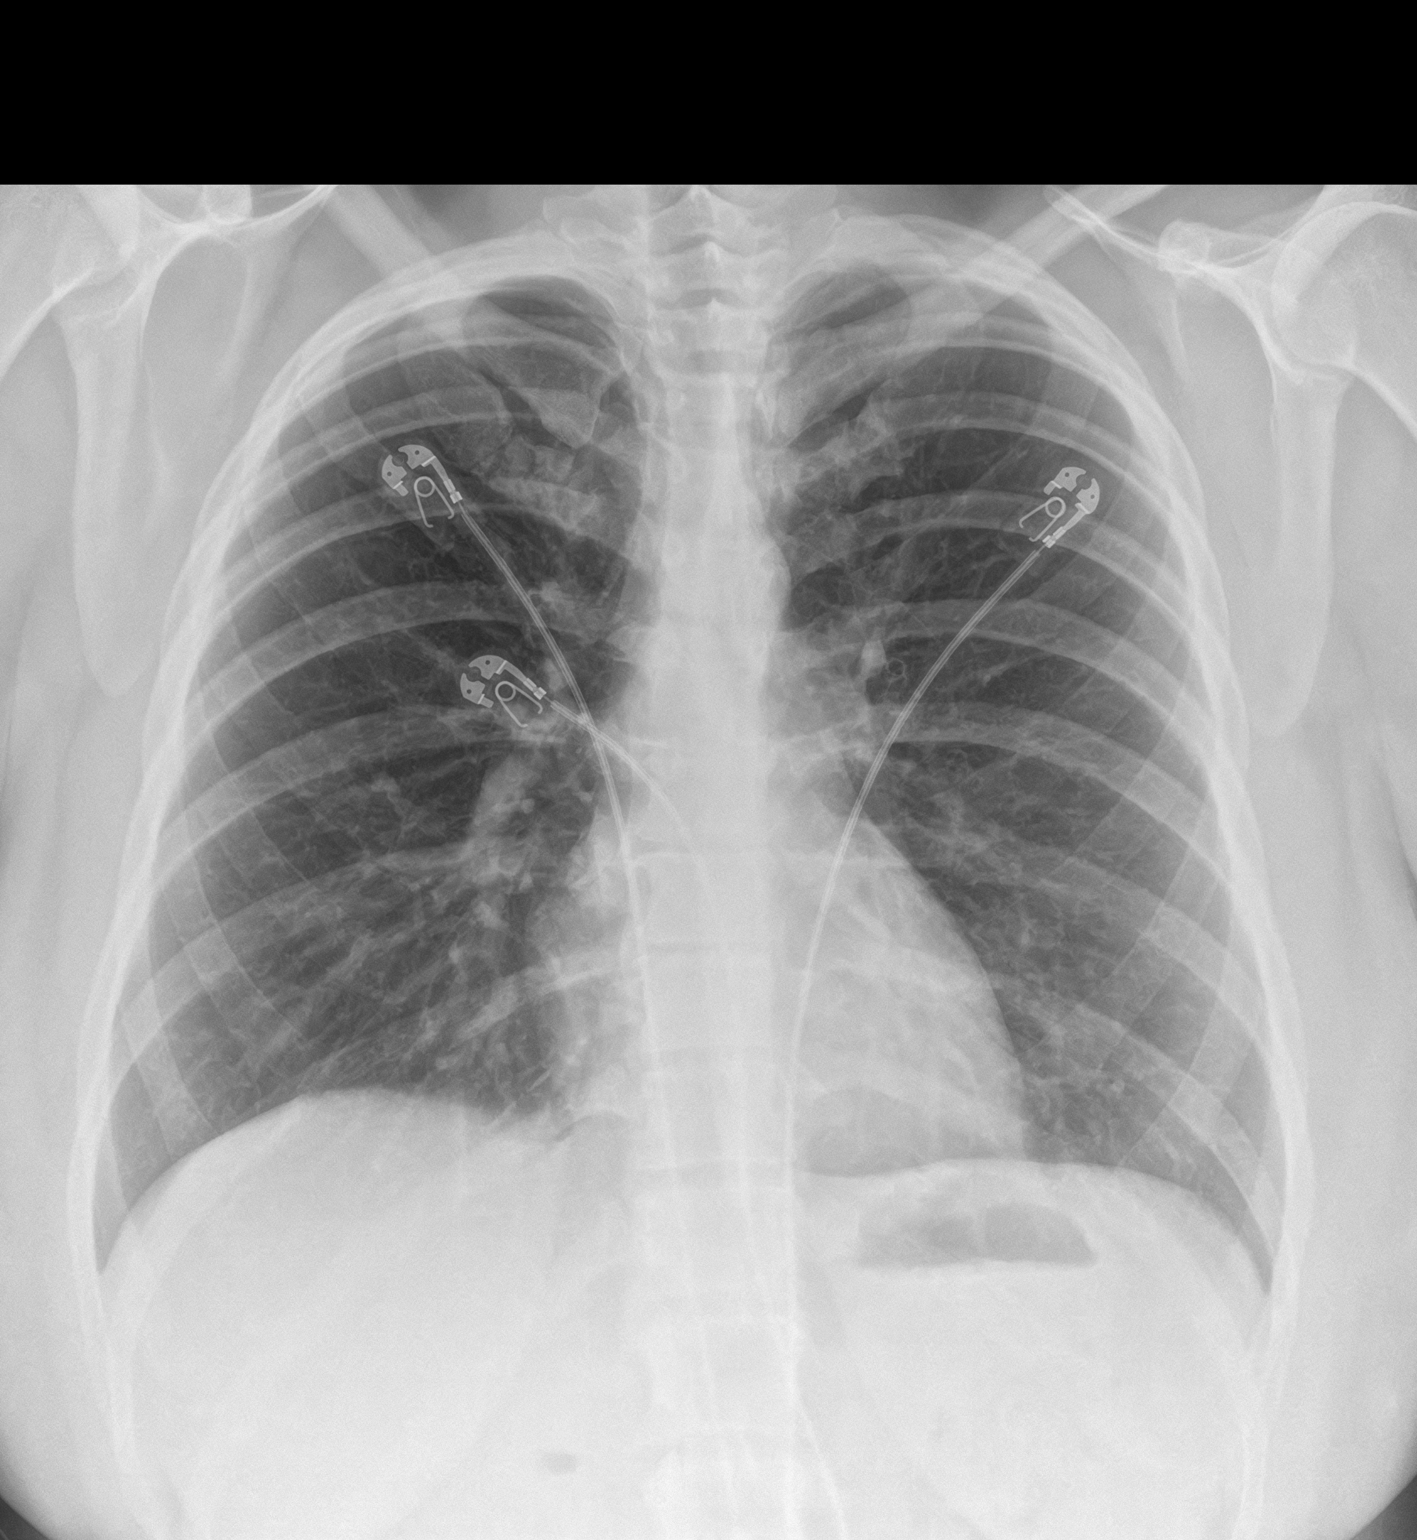
[im 2/2]
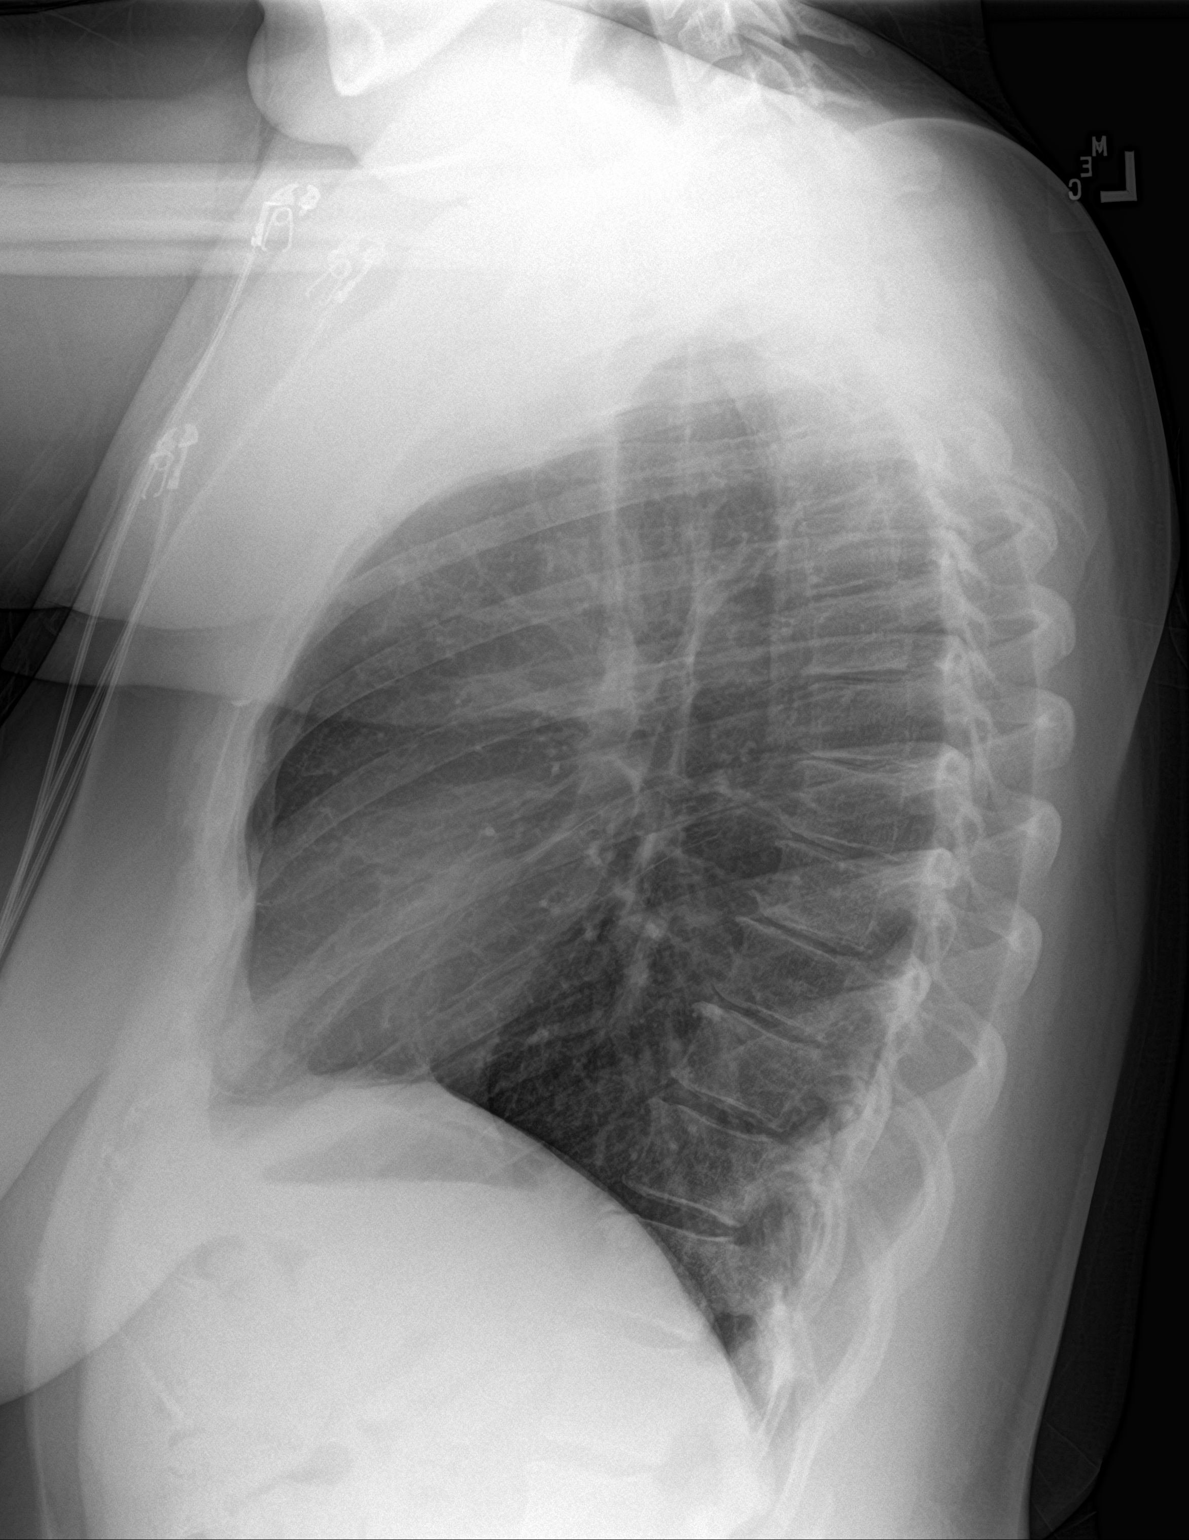

[2 of 2 positions shown; findings below may reference images not displayed]

FINDINGS: The cardiac silhouette, mediastinal and hilar contours are normal.
The lungs are clear. No pulmonary lesions or pleural effusions. No
pneumothorax. The bony thorax is intact.
IMPRESSION: No acute cardiopulmonary findings.

## 2023-01-11 ENCOUNTER — Emergency Department: Payer: Medicaid Other

## 2023-01-11 ENCOUNTER — Other Ambulatory Visit: Payer: Self-pay

## 2023-01-11 ENCOUNTER — Emergency Department
Admission: EM | Admit: 2023-01-11 | Discharge: 2023-01-11 | Disposition: A | Discharge status: Home / Self Care | Payer: Medicaid Other | Attending: Emergency Medicine | Admitting: Emergency Medicine

## 2023-01-11 DIAGNOSIS — F17200 Nicotine dependence, unspecified, uncomplicated: Secondary | ICD-10-CM | POA: Diagnosis not present

## 2023-01-11 DIAGNOSIS — R42 Dizziness and giddiness: Secondary | ICD-10-CM | POA: Insufficient documentation

## 2023-01-11 DIAGNOSIS — R002 Palpitations: Secondary | ICD-10-CM | POA: Insufficient documentation

## 2023-01-11 LAB — TROPONIN I (HIGH SENSITIVITY): Troponin I (High Sensitivity): <2 ng/L (ref <18)

## 2023-01-11 LAB — URINALYSIS, ROUTINE W REFLEX MICROSCOPIC
Bilirubin Urine: NEGATIVE
Glucose, UA: NEGATIVE mg/dL
Hgb urine dipstick: NEGATIVE
Ketones, ur: NEGATIVE mg/dL
Leukocytes,Ua: NEGATIVE
Nitrite: NEGATIVE
Protein, ur: NEGATIVE mg/dL
Specific Gravity, Urine: 1.027 (ref 1.005–1.030)
pH: 5 (ref 5.0–8.0)

## 2023-01-11 LAB — CBC
HCT: 41.2 % (ref 36.0–46.0)
Hemoglobin: 14 g/dL (ref 12.0–15.0)
MCH: 33.5 pg (ref 26.0–34.0)
MCHC: 34 g/dL (ref 30.0–36.0)
MCV: 98.6 fL (ref 80.0–100.0)
Platelets: 273 10*3/uL (ref 150–400)
RBC: 4.18 MIL/uL (ref 3.87–5.11)
RDW: 11.9 % (ref 11.5–15.5)
WBC: 7.1 10*3/uL (ref 4.0–10.5)
nRBC: 0 % (ref 0.0–0.2)

## 2023-01-11 LAB — BASIC METABOLIC PANEL
Anion gap: 10 (ref 5–15)
BUN: 21 mg/dL — ABNORMAL HIGH (ref 6–20)
CO2: 19 mmol/L — ABNORMAL LOW (ref 22–32)
Calcium: 9 mg/dL (ref 8.9–10.3)
Chloride: 106 mmol/L (ref 98–111)
Creatinine, Ser: 1.01 mg/dL — ABNORMAL HIGH (ref 0.44–1.00)
GFR, Estimated: >60 mL/min (ref >60)
Glucose, Bld: 65 mg/dL — ABNORMAL LOW (ref 70–99)
Potassium: 3.8 mmol/L (ref 3.5–5.1)
Sodium: 135 mmol/L (ref 135–145)

## 2023-01-11 LAB — POC URINE PREG, ED: Preg Test, Ur: NEGATIVE

## 2023-01-11 LAB — CBG MONITORING, ED: Glucose-Capillary: 84 mg/dL (ref 70–99)

## 2023-01-11 NOTE — ED Provider Notes (Signed)
[facility] Provider Note    Event Date/Time   First MD Initiated Contact with Patient 01/11/23 1425     (approximate)   History   Palpitations   HPI  Kendra [name] is a 32 y.o. female medical history of depression anxiety, seizure disorder, GERD who presents because of palpitations.  Patient was sitting down when she suddenly felt warm and started sweating feeling her heart was racing.  Heart rate was in the 130s.  She did feel lightheaded.  Did not have chest pain or dyspnea.  The feeling of warmth and lightheadedness lasted for several minutes but the elevated heart rate lasted for about 40 minutes.  She feels somewhat fatigued now but much improved.  Recently started taking Topamax for weight loss.  She is concerned it could be giving her UTI or dehydration.  Is not sure if she has been drinking enough.  She has been peeing more frequently but denies dysuria or urgency.  Denies fevers or chills.  Denies significant abdominal pain constipation or diarrhea     Past Medical History:  Diagnosis Date   Anxiety    Depression    GERD (gastroesophageal reflux disease)    Headache    stress   Heart murmur    History of cannabis abuse    Seizures (HCC)    age 84. syncopal episode. Hit head. had seizure.    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   Chronic GERD    Gastric erythema    Atypical chest pain 02/05/2022   Frequent patient in emergency department 01/22/2022   Seizure-like activity (HCC) 10/11/2021   Non-intractable cyclical vomiting with nausea    Anxiety and depression 01/19/2015   History of cannabis dependence/abuse (HCC) 01/19/2015   Tobacco abuse 01/19/2015   Allergic rhinitis 08/04/2007   Attention deficit disorder 04/18/2007     Physical Exam  Triage Vital Signs: ED Triage Vitals [01/11/23 1355]  Enc Vitals Group     BP  (!) 140/107     Pulse Rate 90     Resp 20     Temp 97.9 F (36.6 C)     Temp Source Oral     SpO2 100 %     Weight 220 lb (99.8 kg)     Height 5\' 7"  (1.702 m)     Head Circumference      Peak Flow      Pain Score 0     Pain Loc      Pain Edu?      Excl. in GC?     Most recent vital signs: Vitals:   01/11/23 1355 01/11/23 1402  BP: (!) 140/107 132/88  Pulse: 90   Resp: 20   Temp: 97.9 F (36.6 C)   SpO2: 100%      General: Awake, no distress.  CV:  Good peripheral perfusion.  No signs of DVT Resp:  Normal effort.  Lung sounds are clear Abd:  No distention.  Abdomen is soft nontender throughout Neuro:             Awake, Alert, Oriented x 3  Other:     ED Results / Procedures / Treatments  Labs (all labs ordered are listed, but only abnormal results are displayed) Labs Reviewed  BASIC METABOLIC PANEL - Abnormal; Notable for the following components:  Result Value   CO2 19 (*)    Glucose, Bld 65 (*)    BUN 21 (*)    Creatinine, Ser 1.01 (*)    All other components within normal limits  URINALYSIS, ROUTINE W REFLEX MICROSCOPIC - Abnormal; Notable for the following components:   Color, Urine YELLOW (*)    APPearance CLOUDY (*)    All other components within normal limits  CBC  POC URINE PREG, ED  CBG MONITORING, ED  TROPONIN I (HIGH SENSITIVITY)     EKG  EKG reviewed interpreted myself shows sinus rhythm with normal axis and intervals T wave inversions anterior precordial lead   RADIOLOGY I reviewed and interpreted the CXR which does not show any acute cardiopulmonary process    PROCEDURES:  Critical Care performed: No  Procedures   MEDICATIONS ORDERED IN ED: Medications - No data to display   IMPRESSION / MDM / ASSESSMENT AND PLAN / ED COURSE  I reviewed the triage vital signs and the nursing notes.                              Patient's presentation is most consistent with acute complicated illness / injury requiring diagnostic  workup.  Differential diagnosis includes, but is not limited to, vasovagal episode, hypoglycemia, arrhythmia, anemia, dehydration, UTI, pregnancy  Patient is a 32 year old female who presents because of lightheadedness and tachycardia.  She was sitting down when she had an episode of feeling warm lightheaded and heart was racing.  Did not feel anxious during this.  Did not syncopized she had no associated chest pain or dyspnea.  Heart racing feeling lasted for about 40 minutes but she now feels back to baseline.  She recently started Topamax for weight loss about 3 weeks ago.  Has been trying to drink more because she was told that it could give her infections if she did not drink enough.  She has had urinary frequency but no urgency or dysuria no fevers or abdominal pain.  Patient's vital signs are reassuring with normal heart rate and oxygen saturations she is afebrile.  On exam she looks well she has no signs of DVT abdominal exam is benign lung sounds are clear.  Patient's EKG does have inverted T wave in V3 but previously the T wave was somewhat flattened.  Patient's troponin is less than 2 and presentation is not consistent with ACS.  Did consider PE but with just 1 short-lived episode of palpitations and no other symptoms feel this is less likely.  Patient's labs do suggest likely a bit of dehydration with elevated BUN to 21 and mild increase in creatinine to 1.01.  Her sugar is also 65.  I did give her orange juice and crackers which she ate.  Question whether the event today could have been hypoglycemia related although patient tells me she had eaten before so this is less likely.  Urinalysis does not suggest infection and pregnancy test is negative.  Ultimately with her heart rate just being in the 130s my suspicion for primary arrhythmia is low.  Suspect either vasovagal episode versus anxiety versus hypoglycemia.  Blood sugar did normalize after p.o.  Discussed maintaining hydration, PCP  follow-up.       FINAL CLINICAL IMPRESSION(S) / ED DIAGNOSES   Final diagnoses:  Palpitations     Rx / DC Orders   ED Discharge Orders     None        Note:  This document was prepared using Dragon voice recognition software and may include unintentional dictation errors.   [name], MD 01/11/23 [phone]

## 2023-01-11 NOTE — Discharge Instructions (Signed)
Your blood work was overall reassuring but your blood sugar was slightly low and your creatinine was slightly increased which could be from dehydration.  Please make sure you are drinking plenty of water.  Your urine sample was normal.

## 2023-01-11 NOTE — ED Triage Notes (Signed)
Patient reports palpitations today, denies chest pain and/or SOB; reports recently starting Topamax.

## 2023-01-12 ENCOUNTER — Other Ambulatory Visit: Payer: Self-pay

## 2023-01-12 ENCOUNTER — Emergency Department
Admission: EM | Admit: 2023-01-12 | Discharge: 2023-01-12 | Disposition: A | Discharge status: Home / Self Care | Payer: Medicaid Other | Attending: Emergency Medicine | Admitting: Emergency Medicine

## 2023-01-12 DIAGNOSIS — R799 Abnormal finding of blood chemistry, unspecified: Secondary | ICD-10-CM | POA: Insufficient documentation

## 2023-01-12 DIAGNOSIS — R899 Unspecified abnormal finding in specimens from other organs, systems and tissues: Secondary | ICD-10-CM

## 2023-01-12 NOTE — Discharge Instructions (Signed)
Though you did have some laboratory values that were abnormal, there is no indication that you have an emergent medical condition at this time.  We recommend that you follow-up as an outpatient and we provided a referral to primary care services to help you with that process.  Please continue to drink plenty of fluids and return to the emergency department if you develop new or worsening symptoms.

## 2023-01-12 NOTE — ED Notes (Signed)
Pt visualized leaving bed in 19H and walking out toward lobby without discharge paperwork.

## 2023-01-12 NOTE — ED Provider Notes (Signed)
[facility] Provider Note    Event Date/Time   First MD Initiated Contact with Patient 01/12/23 0217     (approximate)   History   abnormal labs (Received call from staff?)   HPI  Kendra [name] is a 32 y.o. female whose medical history includes but may not be limited to anxiety, depression, seizure disorder, and acid reflux.  This is her seventh emergency department visit in 6 months, but has approximately 32 emergency department visits within the last 12 months, including the [facility] ED, [facility] ED and [facility] ED.  She presents tonight because she states that someone from [facility] health called her and told her she should come back into the emergency department due to lab abnormalities obtained during her visit to the [facility] ED yesterday.  However, there is no phone message in her chart indicate that is the case.  When I asked her about this, she reports that actually she called the [place] nurse line when she checked MyChart and saw a number of lab abnormalities on her test results which concerned her, such as a low pH on her urine and a low CO2 on her blood work.  She explained that when she talked to the person on the nurse line, the person on the nurse line agreed that possibly she has a metabolic acidosis and that she should return to the emergency department.  She has no specific complaints or concerns at this time but reiterated that she has not been feeling well recently including the episode of lightheadedness, dizziness, and occasionally feeling like she is having trouble catching her breath.       Physical Exam   Triage Vital Signs: ED Triage Vitals [01/12/23 0128]  Enc Vitals Group     BP 136/79     Pulse Rate 99     Resp 16     Temp 98.6 F (37 C)     Temp Source Oral     SpO2 100 %     Weight      Height      Head Circumference      Peak Flow      Pain Score 0     Pain Loc      Pain Edu?      Excl. in GC?     Most recent  vital signs: Vitals:   01/12/23 0128  BP: 136/79  Pulse: 99  Resp: 16  Temp: 98.6 F (37 C)  SpO2: 100%     General: Awake, no obvious distress.   CV:  Good peripheral perfusion.  Regular rate. Resp:  Normal effort.  Speaking easily and comfortably, no accessory muscle usage, no wheezing. Abd:  No distention.   Psych:  The patient initially seemed to have a normal mood and affect but as our conversation continued, she became defensive, interrupted me frequently, and quickly escalated to verbal confrontation that I was unable to de-escalate.  See hospital course for details. Other:  Ambulatory without difficulty and without hesitation.    ED Results / Procedures / Treatments   Labs (all labs ordered are listed, but only abnormal results are displayed) Labs Reviewed  CBG MONITORING, ED       PROCEDURES:  Critical Care performed: No  Procedures   MEDICATIONS ORDERED IN ED: Medications - No data to display   IMPRESSION / MDM / ASSESSMENT AND PLAN / ED COURSE  I reviewed the triage vital signs and the nursing notes.  Differential diagnosis includes, but is not limited to, anxiety over health care, metabolic or electrolyte abnormality, medication or drug side effect, intermittent cardiac arrhythmia, infectious process.  Patient's presentation is most consistent with exacerbation of chronic illness.  The patient has a documented history of anxiety and her frequent utilization of emergency departments over the last year strongly support not only her anxiety diagnosis but anxiety over her medical conditions.  She is otherwise young and generally healthy and spite of a few relatively mild and well-controlled chronic conditions.  I reviewed the labs from her most recent visit and they are reassuring, particularly given her stable vital signs and reassuring overall presentation.  She had been hypoglycemic at the time which could have contributed  to her symptoms as well but she is not on any diabetic medications and is essentially asymptomatic now.  She is more worried about symptoms that she has been having for a while.  I tried to recheck a fingerstick glucose level but did not have the opportunity.  When I saw her my goal was to be friendly and understanding and provided reassurance.  I pulled up the chair to the side of her bed and try to address her concerns.  After establishing the course of events that led to her being here (her calling the nurse line rather than someone calling her, which makes more sense given the lack of a phone message), I tried to reassure her that Dr. Sidney Ace was aware of the few lab abnormalities that were present before and took them into consideration, but they are not concerning at this point.  She became quite defensive and upset at this and ask how any abnormalities could be considered normal.  I tried to explain that anything such as hyperventilation or changes in respiration can change the ones CO2, for example, and that it is our job is doctors to interpret the lab results in the clinical setting and decide what else needs to be done.  She became quite upset and continually interrupted me, leading to a difficult interaction.  She accused me of not caring and not talking to her correctly.  I explained to her that that was not the case, which is why I pulled up a chair and was trying to provide reassurance, but she was very unhappy and said that she was leaving.  I did not get to talk to her about other options such as rechecking her blood work to verify for her that they are generally good and not concerning.  However, given that the plan was going to be for discharge anyway, I think this is reasonable given that she has no indication of an emergent medical condition at this time.  Even though she did not stay for paperwork, I put in a PCP follow-up order as she does not have a documented primary care provider and she  would undoubtedly benefit from one.      FINAL CLINICAL IMPRESSION(S) / ED DIAGNOSES   Final diagnoses:  Abnormal laboratory test result     Rx / DC Orders   ED Discharge Orders          Ordered    Ambulatory Referral to Primary Care (Establish Care)        01/12/23 0225             Note:  This document was prepared using Dragon voice recognition software and may include unintentional dictation errors.   Loleta Rose, MD 01/12/23 [phone]

## 2023-01-12 NOTE — ED Notes (Addendum)
MD at bedside - pulled up chair to sit and speak with pt. MD addressing laboratory values - educating pt on clinical indications and clinical significances. Pt repeatedly interrupting MD and speaking over MD. Pt becomes argumentative and states "You don't care about me." Deescalation techniques and therapeutic communication utilized by MD.

## 2023-01-12 NOTE — ED Notes (Signed)
Pt here due to concern for metabolic acidosis per laboratory results collected yesterday. Pt reports increased water intake.

## 2023-01-12 NOTE — ED Triage Notes (Signed)
Pt to ed from home via POV due to receiving a phone call from a [facility] staff stating her labs that were collected earlier in the day today were abnormal and she must come back and be seen. Pt is caox4, in no acute distress and ambulatory in triage. Pt denies any symptoms at this time.

## 2023-01-14 ENCOUNTER — Ambulatory Visit
Admission: RE | Admit: 2023-01-14 | Discharge: 2023-01-14 | Disposition: A | Discharge status: Home / Self Care | Payer: Medicaid Other | Source: Ambulatory Visit | Attending: Urgent Care | Admitting: Urgent Care

## 2023-01-14 VITALS — BP 122/84 | HR 86 | Temp 98.8°F | Resp 18

## 2023-01-14 DIAGNOSIS — R849 Unspecified abnormal finding in specimens from respiratory organs and thorax: Secondary | ICD-10-CM | POA: Diagnosis not present

## 2023-01-14 LAB — URINALYSIS, W/ REFLEX TO CULTURE (INFECTION SUSPECTED)
Bilirubin Urine: NEGATIVE
Glucose, UA: NEGATIVE mg/dL
Hgb urine dipstick: NEGATIVE
Ketones, ur: NEGATIVE mg/dL
Leukocytes,Ua: NEGATIVE
Nitrite: NEGATIVE
Protein, ur: NEGATIVE mg/dL
Specific Gravity, Urine: 1.024 (ref 1.005–1.030)
pH: 5 (ref 5.0–8.0)

## 2023-01-14 NOTE — Discharge Instructions (Addendum)
Follow-up on your lab results with your primary care provider at your scheduled visit.

## 2023-01-14 NOTE — ED Triage Notes (Signed)
States she was told to go to the ED due to her labwork a few days ago with a general feeling of malaise. Was told in the ED that she was dehydrated. Patient states she's been hydrating a lot without feeling any better. States she did stop her Topiramate on Saturday. Mostly requesting follow up blood work to make sure condition isn't worsening.

## 2023-01-14 NOTE — ED Provider Notes (Signed)
[facility]    CSN: [id number] Arrival date & time: 01/14/23  1017      History   Chief Complaint Chief Complaint  Patient presents with   Follow-up    Follow up from emergency department had some weird lab results back that I was dehydrated CO2 is at 19 urine pH is at [place] are slightly elevated in the creatine area and the bun is at 21 situation is not resolving so I'm trying to retake test - Entered by patient    HPI Kendra [name] is a 32 y.o. female.   HPI  Presents to urgent care for follow-up after ED visit several days ago with abnormal lab results.  Patient was told she was dehydrated and endorses improvement in hydration status but states she is not feeling any better (ED visit was to evaluate "palpitations" diaphoresis.  She continues to report general feeling of "malaise".  Reports recently starting new medication Topamax for weight loss and states that she has stopped taking this now x 3 days.  She reports normal urine and bowel function.  Reports 1 episode of soft "diarrhea".  She states her urine appears pale in color.  Reports recent quitting smoking and now using nicotine vaping device.  She states she "rarely" uses the device.  Does not drink significant amounts of caffeinated beverages.  Past Medical History:  Diagnosis Date   Anxiety    Depression    GERD (gastroesophageal reflux disease)    Headache    stress   Heart murmur    History of cannabis abuse    Seizures (HCC)    age 81. syncopal episode. Hit head. had seizure.    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   Chronic GERD    Gastric erythema    Atypical chest pain 02/05/2022   Frequent patient in emergency department 01/22/2022   Seizure-like activity (HCC) 10/11/2021   Non-intractable cyclical vomiting with nausea    Anxiety and depression 01/19/2015   History of  cannabis dependence/abuse (HCC) 01/19/2015   Tobacco abuse 01/19/2015   Allergic rhinitis 08/04/2007   Attention deficit disorder 04/18/2007    Past Surgical History:  Procedure Laterality Date   COLON SURGERY  2013   pt states "colonoscopy while awake"   COLONOSCOPY WITH PROPOFOL N/A 12/14/2015   Procedure: COLONOSCOPY WITH PROPOFOL;  Surgeon: Midge Minium, MD;  Location: [facility];  Service: Endoscopy;  Laterality: N/A;   ESOPHAGOGASTRODUODENOSCOPY (EGD) WITH PROPOFOL N/A 12/14/2015   Procedure: ESOPHAGOGASTRODUODENOSCOPY (EGD) WITH PROPOFOL;  Surgeon: Midge Minium, MD;  Location: [facility];  Service: Endoscopy;  Laterality: N/A;   ESOPHAGOGASTRODUODENOSCOPY (EGD) WITH PROPOFOL N/A 02/21/2022   Procedure: ESOPHAGOGASTRODUODENOSCOPY (EGD) WITH BIOPSY;  Surgeon: Toney Reil, MD;  Location: [facility];  Service: Endoscopy;  Laterality: N/A;   LAPAROSCOPIC TUBAL LIGATION Bilateral 12/03/2021   Procedure: LAPAROSCOPIC TUBAL LIGATION;  Surgeon: Linzie Collin, MD;  Location: [facility];  Service: Gynecology;  Laterality: Bilateral;   WISDOM TOOTH EXTRACTION  2013    OB History     Gravida  2   Para  1   Term  1   Preterm      AB  1   Living  1      SAB      IAB      Ectopic  Multiple      Live Births  1            Home Medications    Prior to Admission medications   Medication Sig Start Date End Date Taking? Authorizing Provider  buprenorphine-naloxone (SUBOXONE) 8-2 mg SUBL SL tablet Place 1 tablet under the tongue daily. Patient uses 8mg  film bid   Yes [provider]  sertraline (ZOLOFT) 50 MG tablet Take 50 mg by mouth daily. 11/06/22  Yes [provider]  Vitamin D, Ergocalciferol, (DRISDOL) 1.25 MG (50000 UNIT) CAPS capsule Take 50,000 Units by mouth once a week. 09/28/22  Yes [provider]  hydrocortisone 1 % lotion Apply 1 Application topically 2 (two) times daily. 08/07/22   Triplett, Rulon Eisenmenger B,  FNP  meclizine (ANTIVERT) 25 MG tablet Take 1 tablet (25 mg total) by mouth 3 (three) times daily as needed for dizziness. 03/06/22   [name], MD  meloxicam (MOBIC) 15 MG tablet Take 15 mg by mouth daily as needed. 09/25/21   [provider]  methocarbamol (ROBAXIN) 500 MG tablet Take by mouth. 09/14/21   [provider]  pantoprazole (PROTONIX) 40 MG tablet Take 1 tablet by mouth daily. 01/26/22 02/25/22  [provider]  valACYclovir (VALTREX) 500 MG tablet Take 1 tablet (500 mg total) by mouth daily. Can increase to twice a day for 5 days in the event of a recurrence 03/26/22   Linzie Collin, MD    Family History Family History  Problem Relation Age of Onset   Depression Mother    Diabetes Maternal Grandfather    Congestive Heart Failure Maternal Grandfather    Diverticulitis Maternal Grandmother    Dementia Maternal Grandmother     Social History Social History   Tobacco Use   Smoking status: Former    Packs/day: 1.00    Years: 8.00    Additional pack years: 0.00    Total pack years: 8.00    Types: Cigarettes   Smokeless tobacco: Never  Vaping Use   Vaping Use: Every day  Substance Use Topics   Alcohol use: No   Drug use: Not Currently    Types: Marijuana     Allergies   Patient has no known allergies.   Review of Systems Review of Systems   Physical Exam Triage Vital Signs ED Triage Vitals [01/14/23 1053]  Enc Vitals Group     BP 122/84     Pulse Rate 86     Resp 18     Temp 98.8 F (37.1 C)     Temp Source Oral     SpO2 95 %     Weight      Height      Head Circumference      Peak Flow      Pain Score      Pain Loc      Pain Edu?      Excl. in GC?    No data found.  Updated Vital Signs BP 122/84 (BP Location: Left Arm)   Pulse 86   Temp 98.8 F (37.1 C) (Oral)   Resp 18   SpO2 95%   Visual Acuity Right Eye Distance:   Left Eye Distance:   Bilateral Distance:    Right Eye Near:   Left Eye Near:     Bilateral Near:     Physical Exam Vitals reviewed.  Constitutional:      Appearance: Normal appearance.  Cardiovascular:     Rate and  Rhythm: Normal rate and regular rhythm.     Pulses: Normal pulses.     Heart sounds: Normal heart sounds.  Pulmonary:     Effort: Pulmonary effort is normal.     Breath sounds: Normal breath sounds.  Abdominal:     General: Abdomen is flat. Bowel sounds are normal.     Palpations: Abdomen is soft.  Skin:    General: Skin is warm and dry.  Neurological:     General: No focal deficit present.     Mental Status: She is alert and oriented to person, place, and time.  Psychiatric:        Mood and Affect: Mood normal.        Behavior: Behavior normal.      UC Treatments / Results  Labs (all labs ordered are listed, but only abnormal results are displayed) Labs Reviewed - No data to display  EKG   Radiology No results found.  Procedures Procedures (including critical care time)  Medications Ordered in UC Medications - No data to display  Initial Impression / Assessment and Plan / UC Course  I have reviewed the triage vital signs and the nursing notes.  Pertinent labs & imaging results that were available during my care of the patient were reviewed by me and considered in my medical decision making (see chart for details).   DASHELLE BARTKUS is a 32 y.o. female presenting with fatigue/"malaise". Patient is afebrile without recent antipyretics, satting well on room air. Overall is well appearing, well hydrated, without respiratory distress. Pulmonary exam is unremarkable.  Lungs CTAB without wheezing, rhonchi, rales. RRR.  Left upper and right upper abdominal tenderness is present.  Normal active bowel sounds.  Reviewed relevant chart history.   Benign exam today.  Will draw labs at patient's request and she will make them available to her primary care at visit scheduled on 01/16/2023.  Counseled patient on potential for adverse  effects with medications prescribed/recommended today, ER and return-to-clinic precautions discussed, patient verbalized understanding and agreement with care plan.  Final Clinical Impressions(s) / UC Diagnoses   Final diagnoses:  None   Discharge Instructions   None    ED Prescriptions   None    PDMP not reviewed this encounter.   Charma Igo, [place] 01/14/23 1128

## 2023-01-15 LAB — COMPREHENSIVE METABOLIC PANEL
ALT: 10 IU/L (ref 0–32)
AST: 11 IU/L (ref 0–40)
Albumin: 4.3 g/dL (ref 4.0–5.0)
Alkaline Phosphatase: 70 IU/L (ref 44–121)
BUN/Creatinine Ratio: 13 (ref 9–23)
BUN: 11 mg/dL (ref 6–20)
Bilirubin Total: 0.6 mg/dL (ref 0.0–1.2)
CO2: 20 mmol/L (ref 20–29)
Calcium: 9.1 mg/dL (ref 8.7–10.2)
Chloride: 106 mmol/L (ref 96–106)
Creatinine, Ser: 0.86 mg/dL (ref 0.57–1.00)
Globulin, Total: 3 g/dL (ref 1.5–4.5)
Glucose: 92 mg/dL (ref 70–99)
Potassium: 4 mmol/L (ref 3.5–5.2)
Sodium: 140 mmol/L (ref 134–144)
Total Protein: 7.3 g/dL (ref 6.0–8.5)
eGFR: 93 mL/min/{1.73_m2} (ref >59)

## 2023-01-15 LAB — CBC WITH DIFFERENTIAL/PLATELET
Basophils Absolute: 0 10*3/uL (ref 0.0–0.2)
Basos: 1 %
EOS (ABSOLUTE): 0.1 10*3/uL (ref 0.0–0.4)
Eos: 2 %
Hematocrit: 39.1 % (ref 34.0–46.6)
Hemoglobin: 13.3 g/dL (ref 11.1–15.9)
Immature Grans (Abs): 0 10*3/uL (ref 0.0–0.1)
Immature Granulocytes: 0 %
Lymphocytes Absolute: 2.3 10*3/uL (ref 0.7–3.1)
Lymphs: 31 %
MCH: 33.2 pg — ABNORMAL HIGH (ref 26.6–33.0)
MCHC: 34 g/dL (ref 31.5–35.7)
MCV: 98 fL — ABNORMAL HIGH (ref 79–97)
Monocytes Absolute: 0.7 10*3/uL (ref 0.1–0.9)
Monocytes: 9 %
Neutrophils Absolute: 4.2 10*3/uL (ref 1.4–7.0)
Neutrophils: 57 %
Platelets: 308 10*3/uL (ref 150–450)
RBC: 4.01 x10E6/uL (ref 3.77–5.28)
RDW: 12 % (ref 11.7–15.4)
WBC: 7.3 10*3/uL (ref 3.4–10.8)

## 2023-02-08 IMAGING — CR DG CHEST 2V
2 series · 2 of 2 positions shown · non-contrast
Comparison: Radiograph 12/26/2018

CLINICAL DATA: Chest pain

EXAM:
CHEST - 2 VIEW

[chest pa]
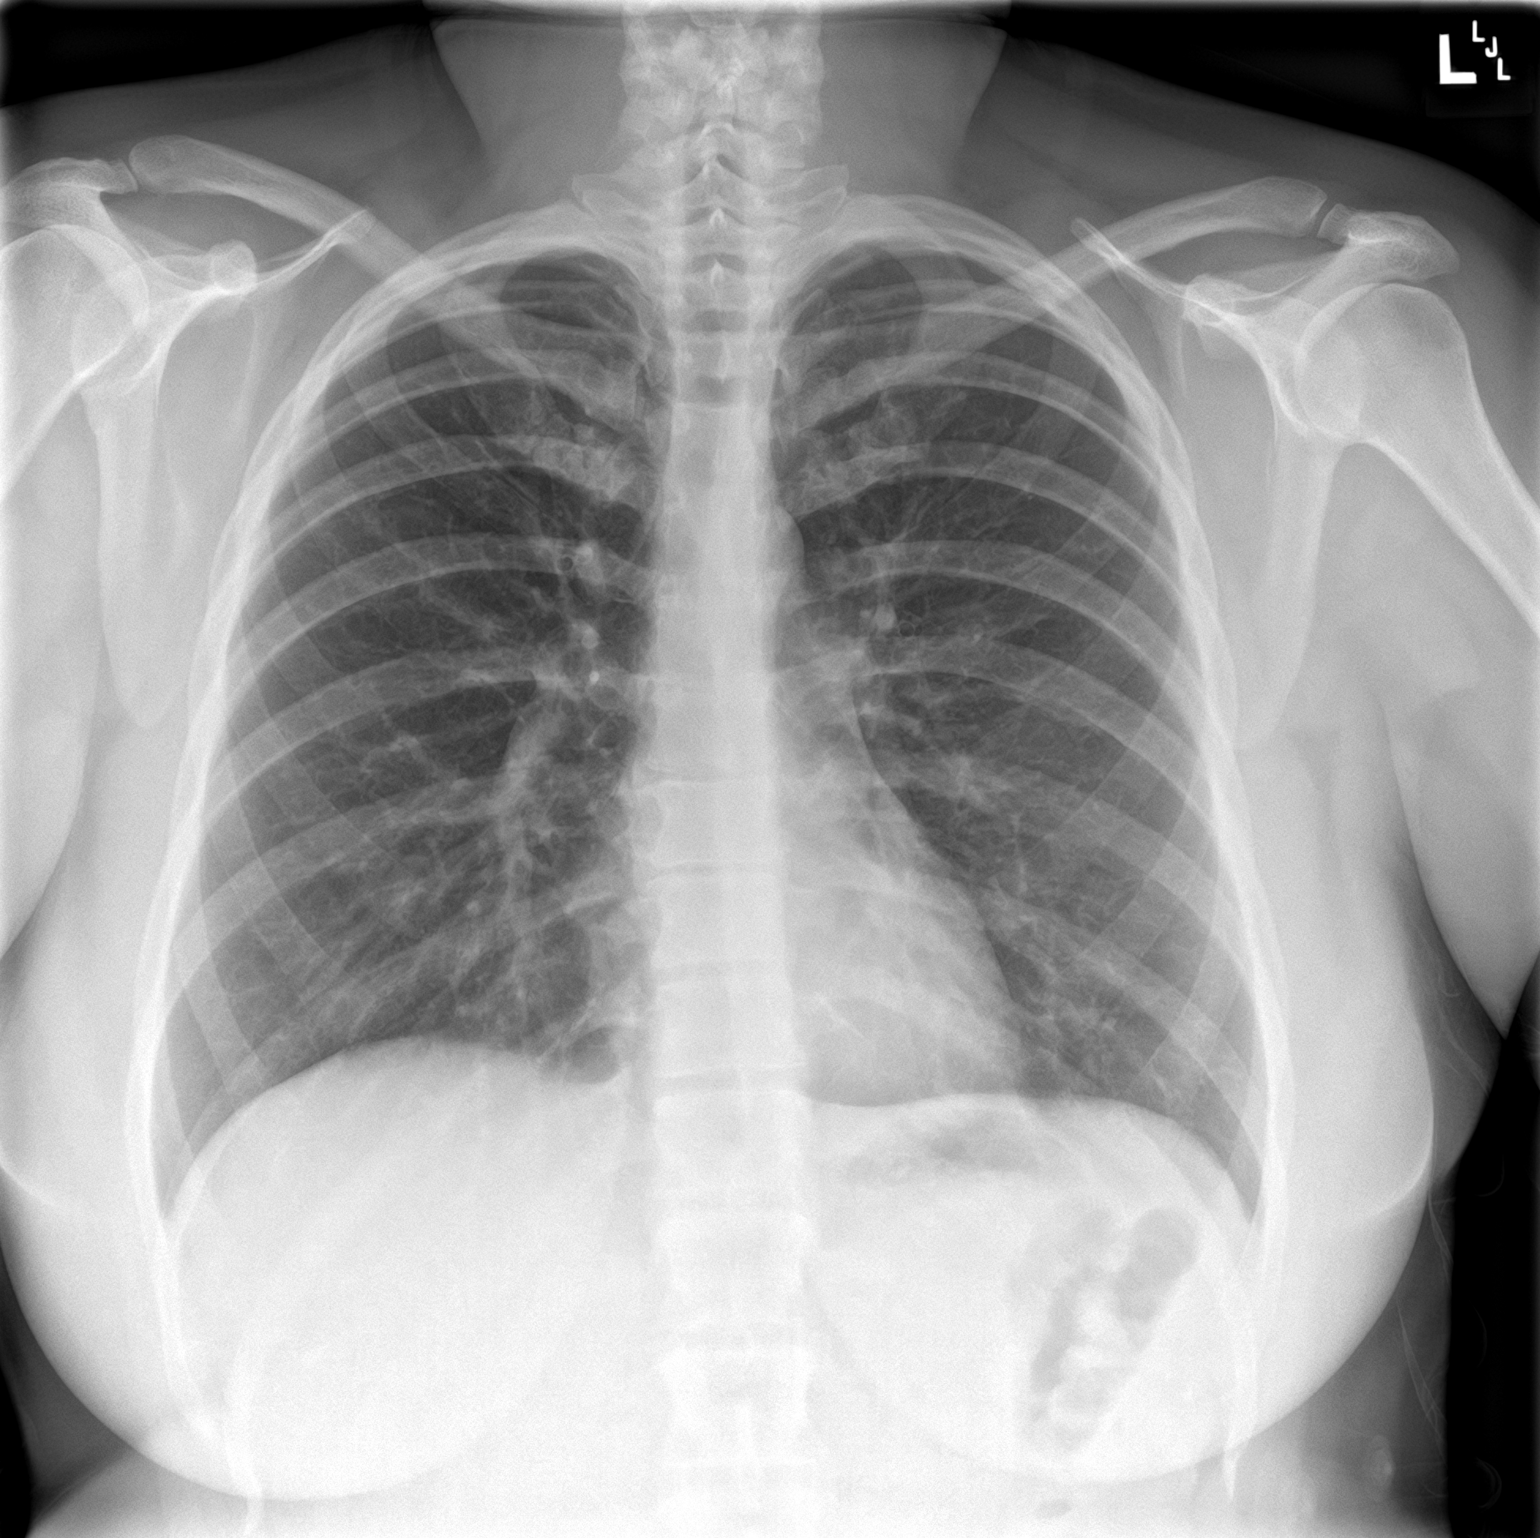

[chest lat]
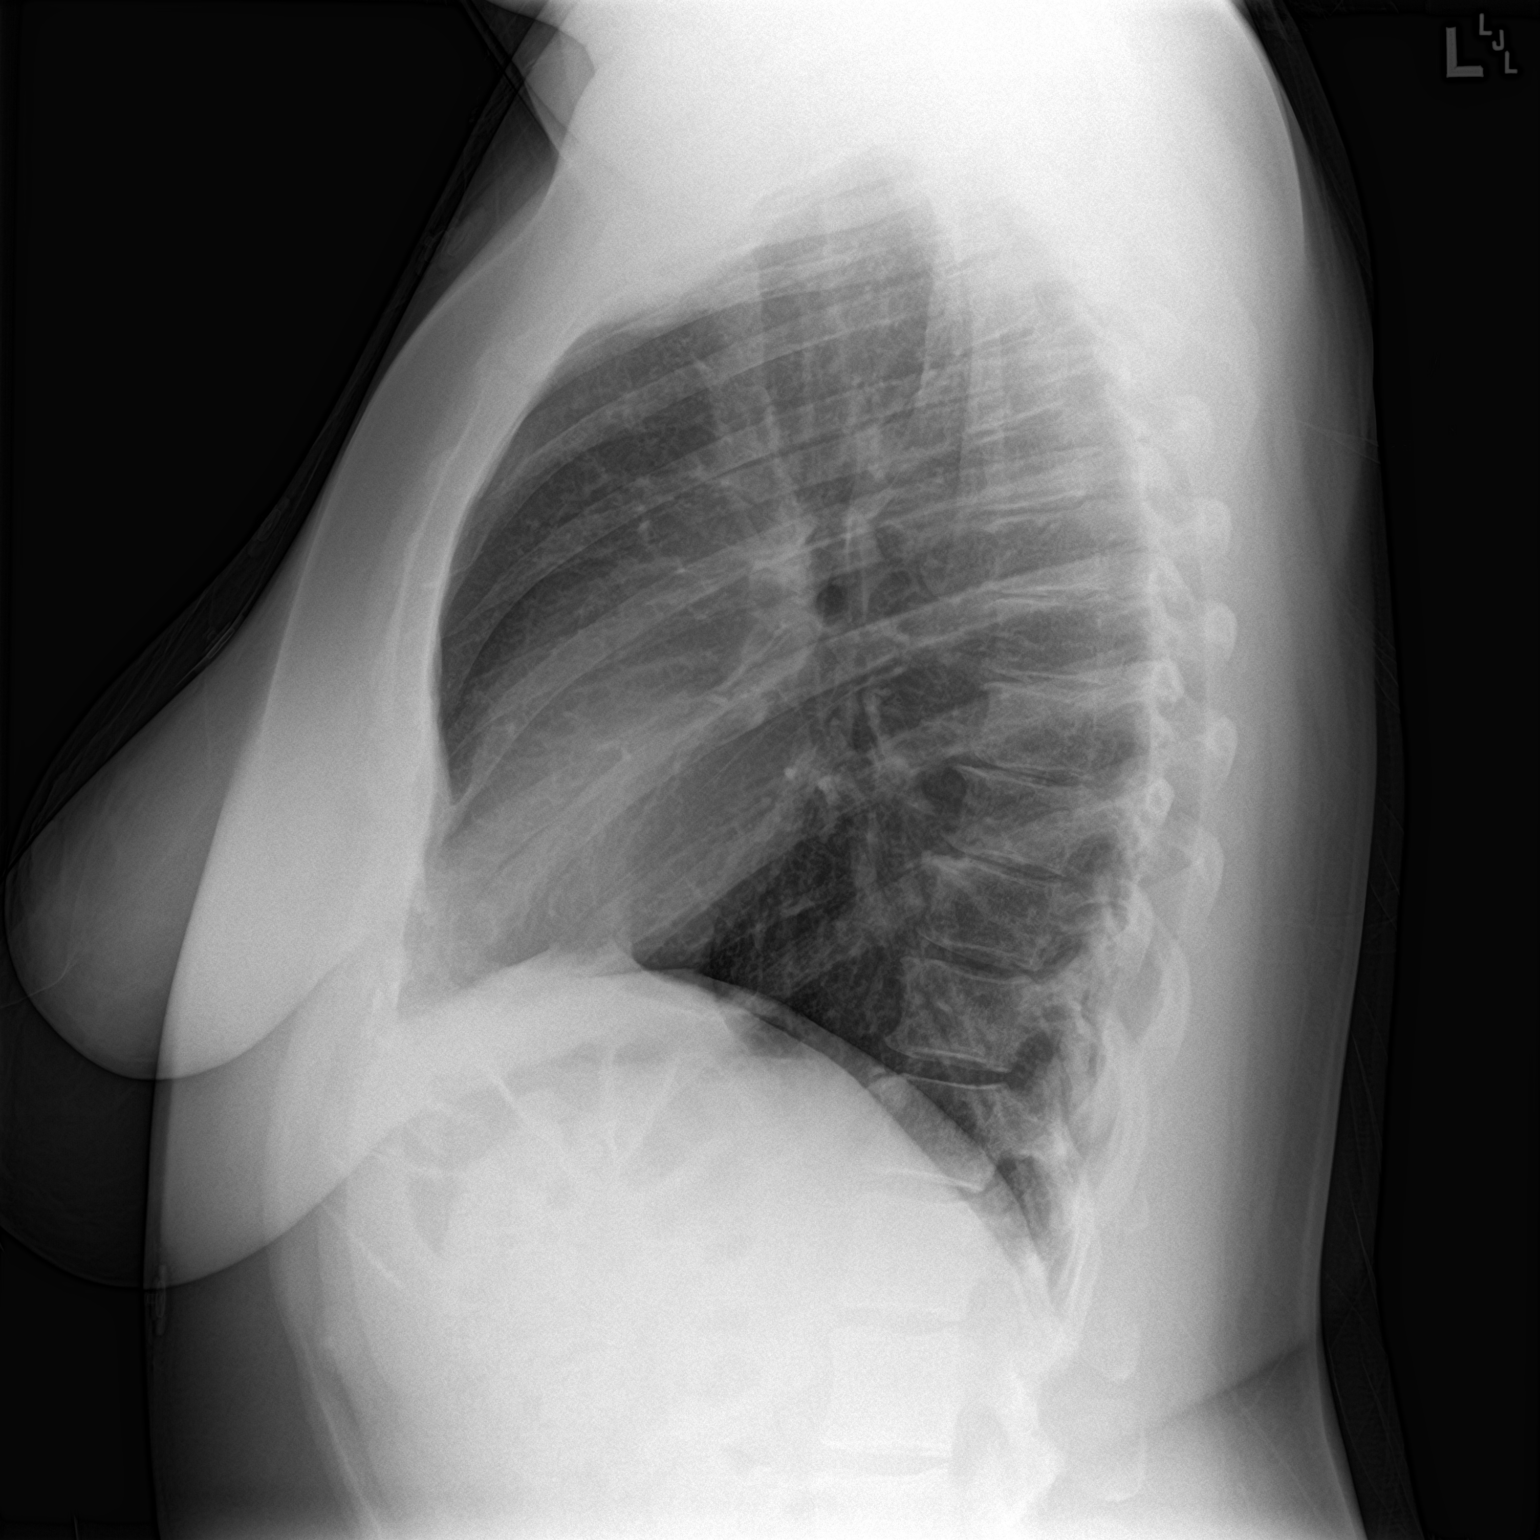

[2 of 2 positions shown; findings below may reference images not displayed]

FINDINGS: Cardiomediastinal silhouette is within normal limits. There is no
focal airspace disease. There is no pleural effusion. No
pneumothorax. There is no acute osseous abnormality.
IMPRESSION: No evidence of acute cardiopulmonary disease.

## 2023-02-14 IMAGING — CR DG CHEST 2V
1 series · 2 of 2 positions shown · non-contrast
Comparison: 01/03/2022

CLINICAL DATA: Left chest pain

EXAM:
CHEST - 2 VIEW

[Series 1: dg chest 2 view · 0.14mm/px · 2 of 2 slices shown]
[im 1/2]
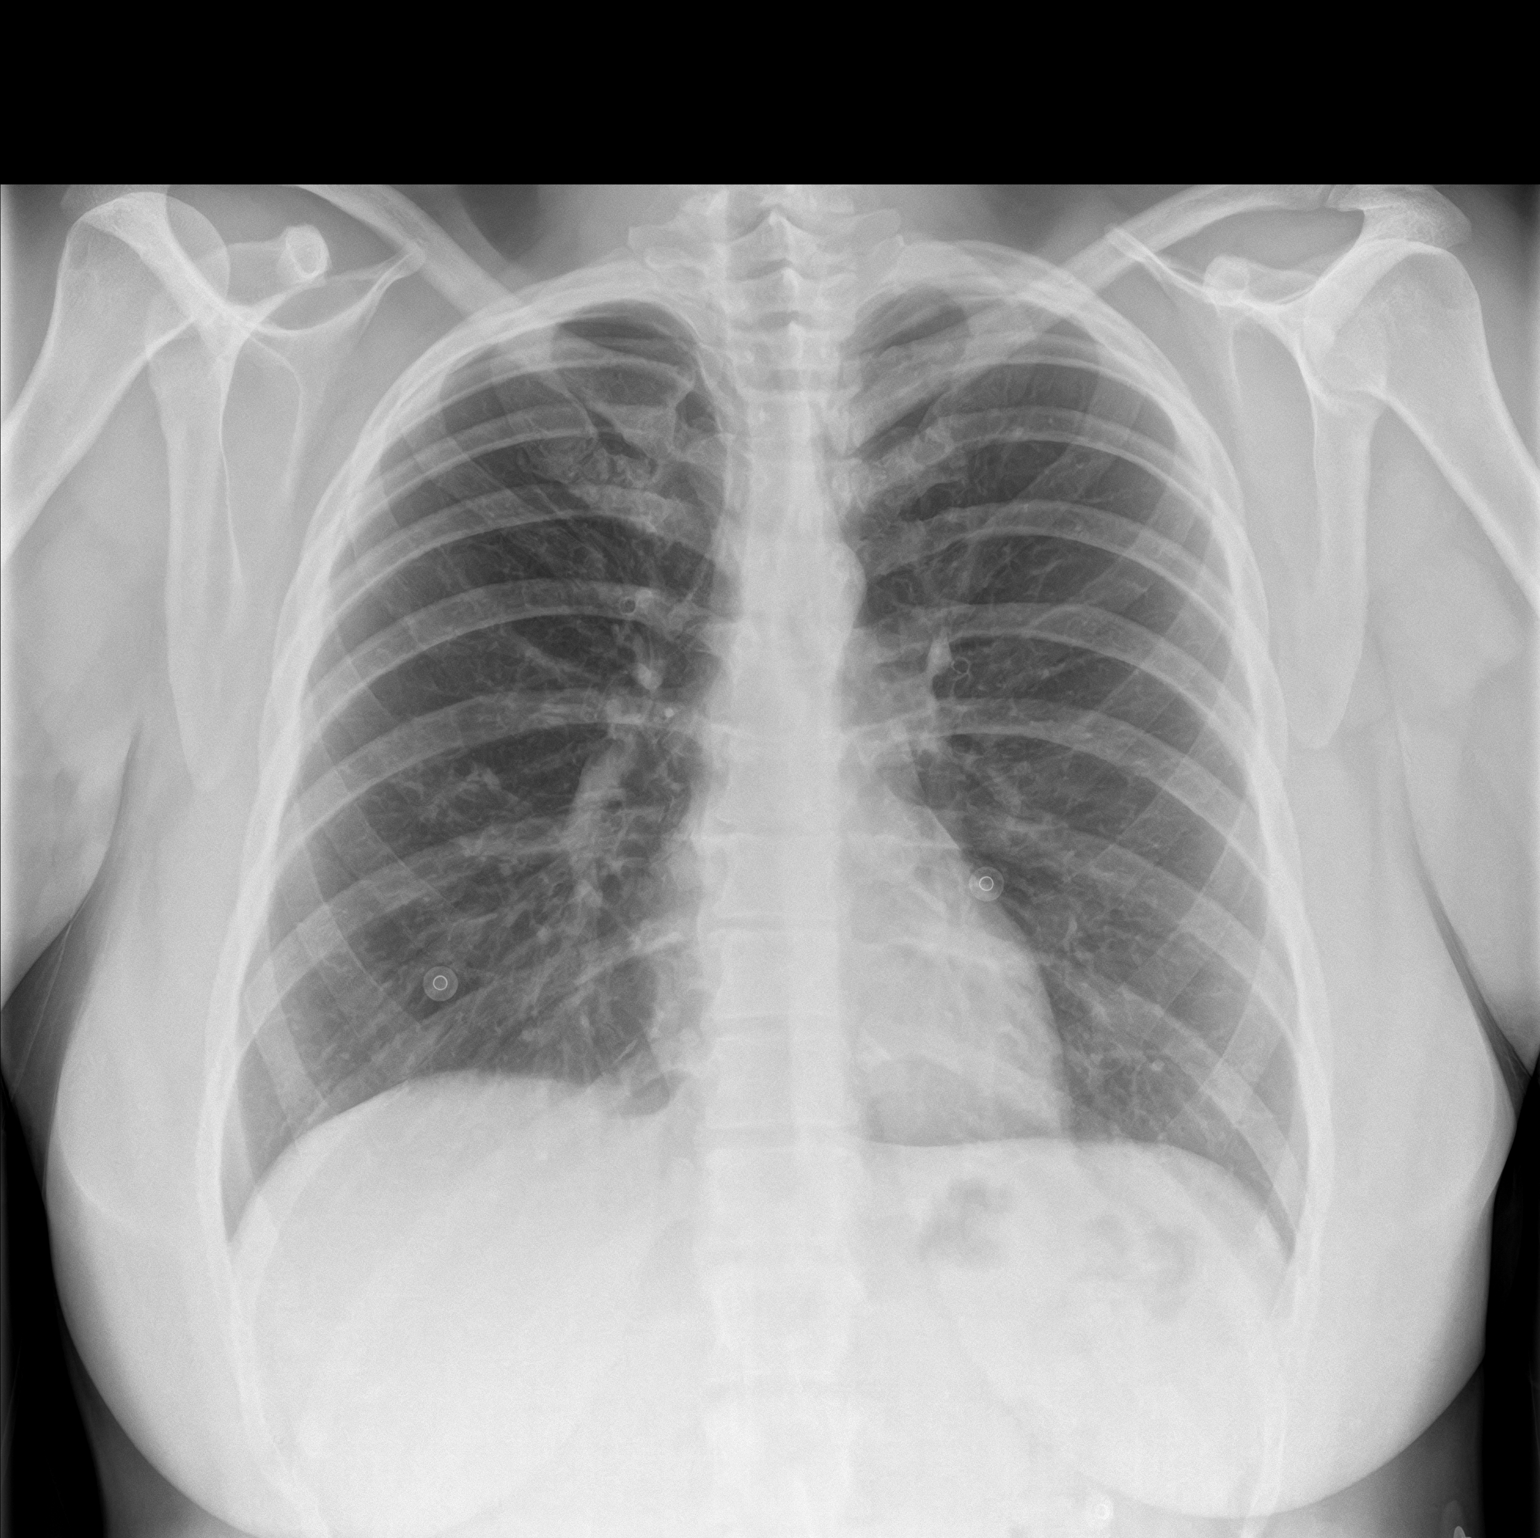
[im 2/2]
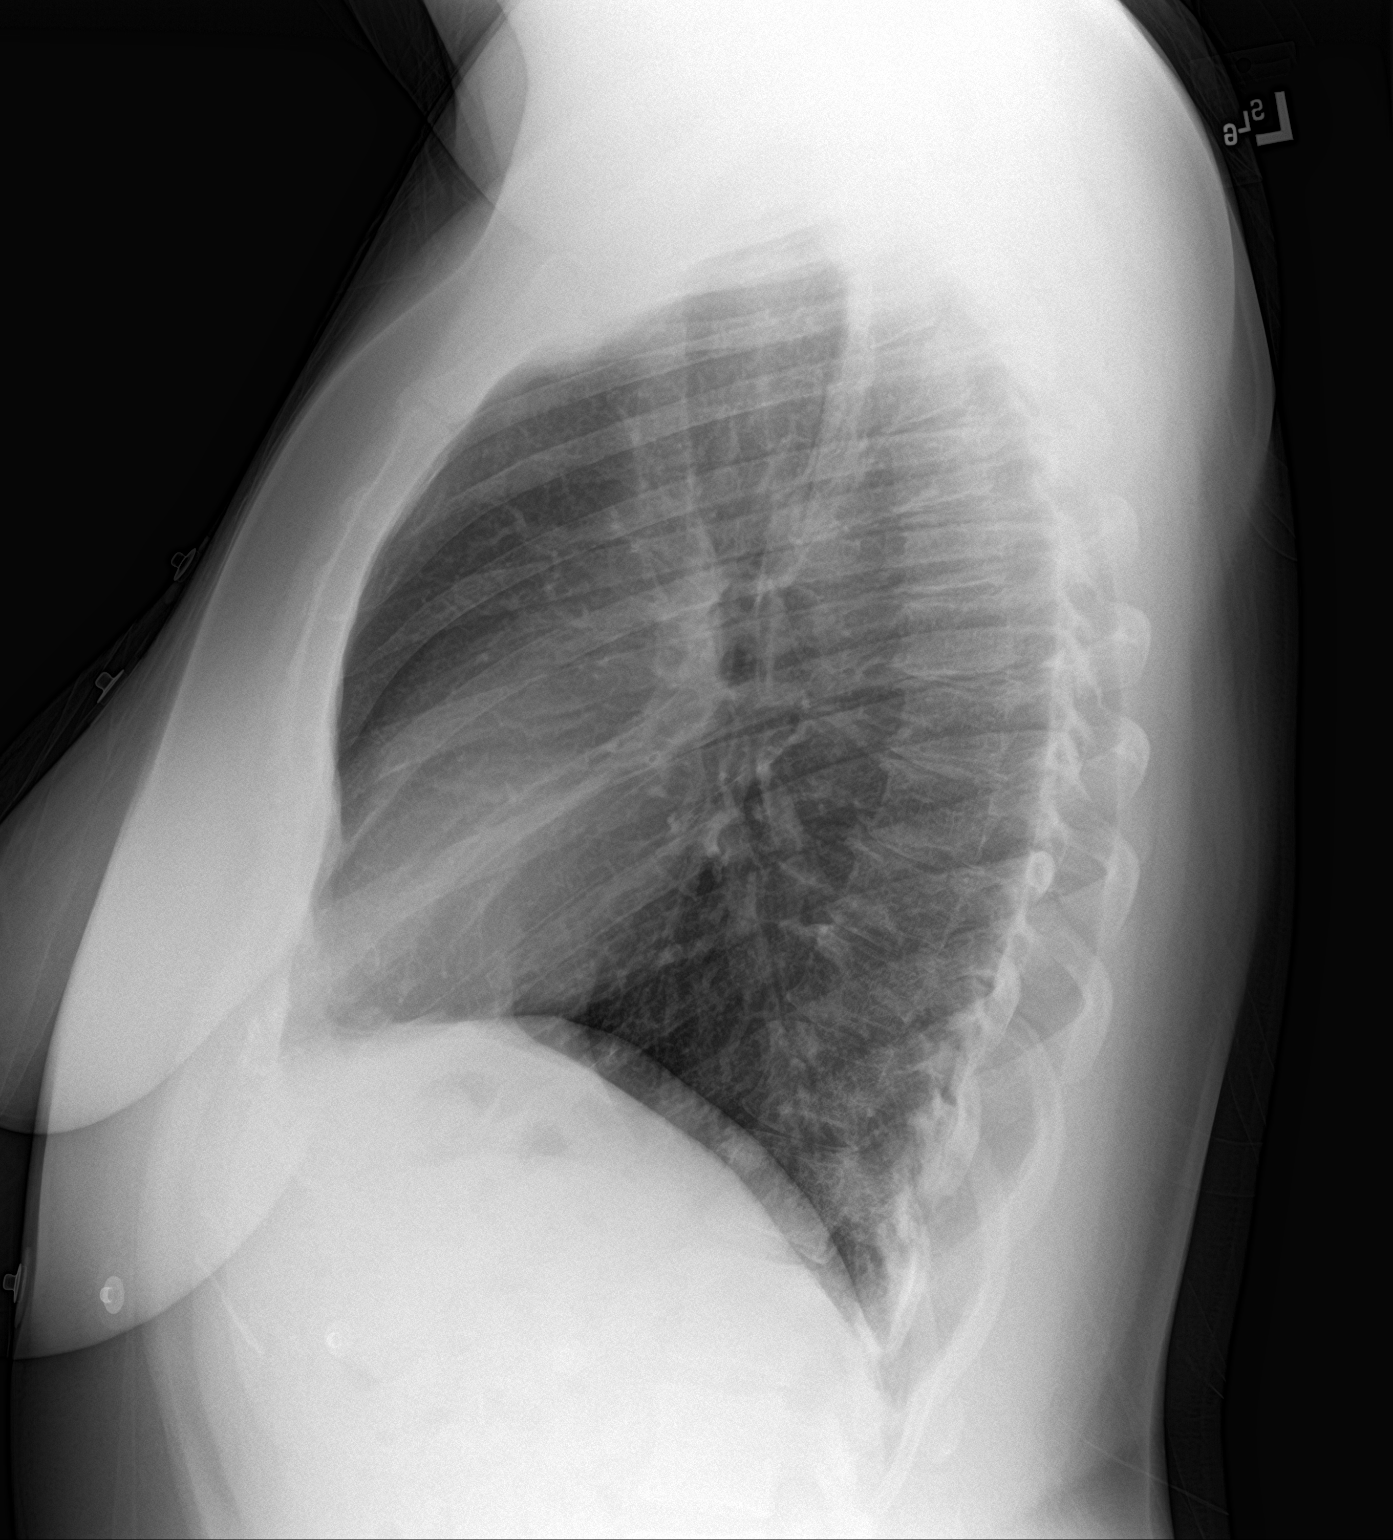

[2 of 2 positions shown; findings below may reference images not displayed]

FINDINGS: The heart size and mediastinal contours are within normal limits.
Both lungs are clear. The visualized skeletal structures are
unremarkable.
IMPRESSION: No active cardiopulmonary disease.

## 2023-02-18 IMAGING — CR DG CHEST 2V
1 series · 2 of 2 positions shown · non-contrast
Comparison: 01/09/2022

CLINICAL DATA: Chest pain

EXAM:
CHEST - 2 VIEW

[Series 1: dg chest 2 view · 0.14mm/px · 2 of 2 slices shown]
[im 1/2]
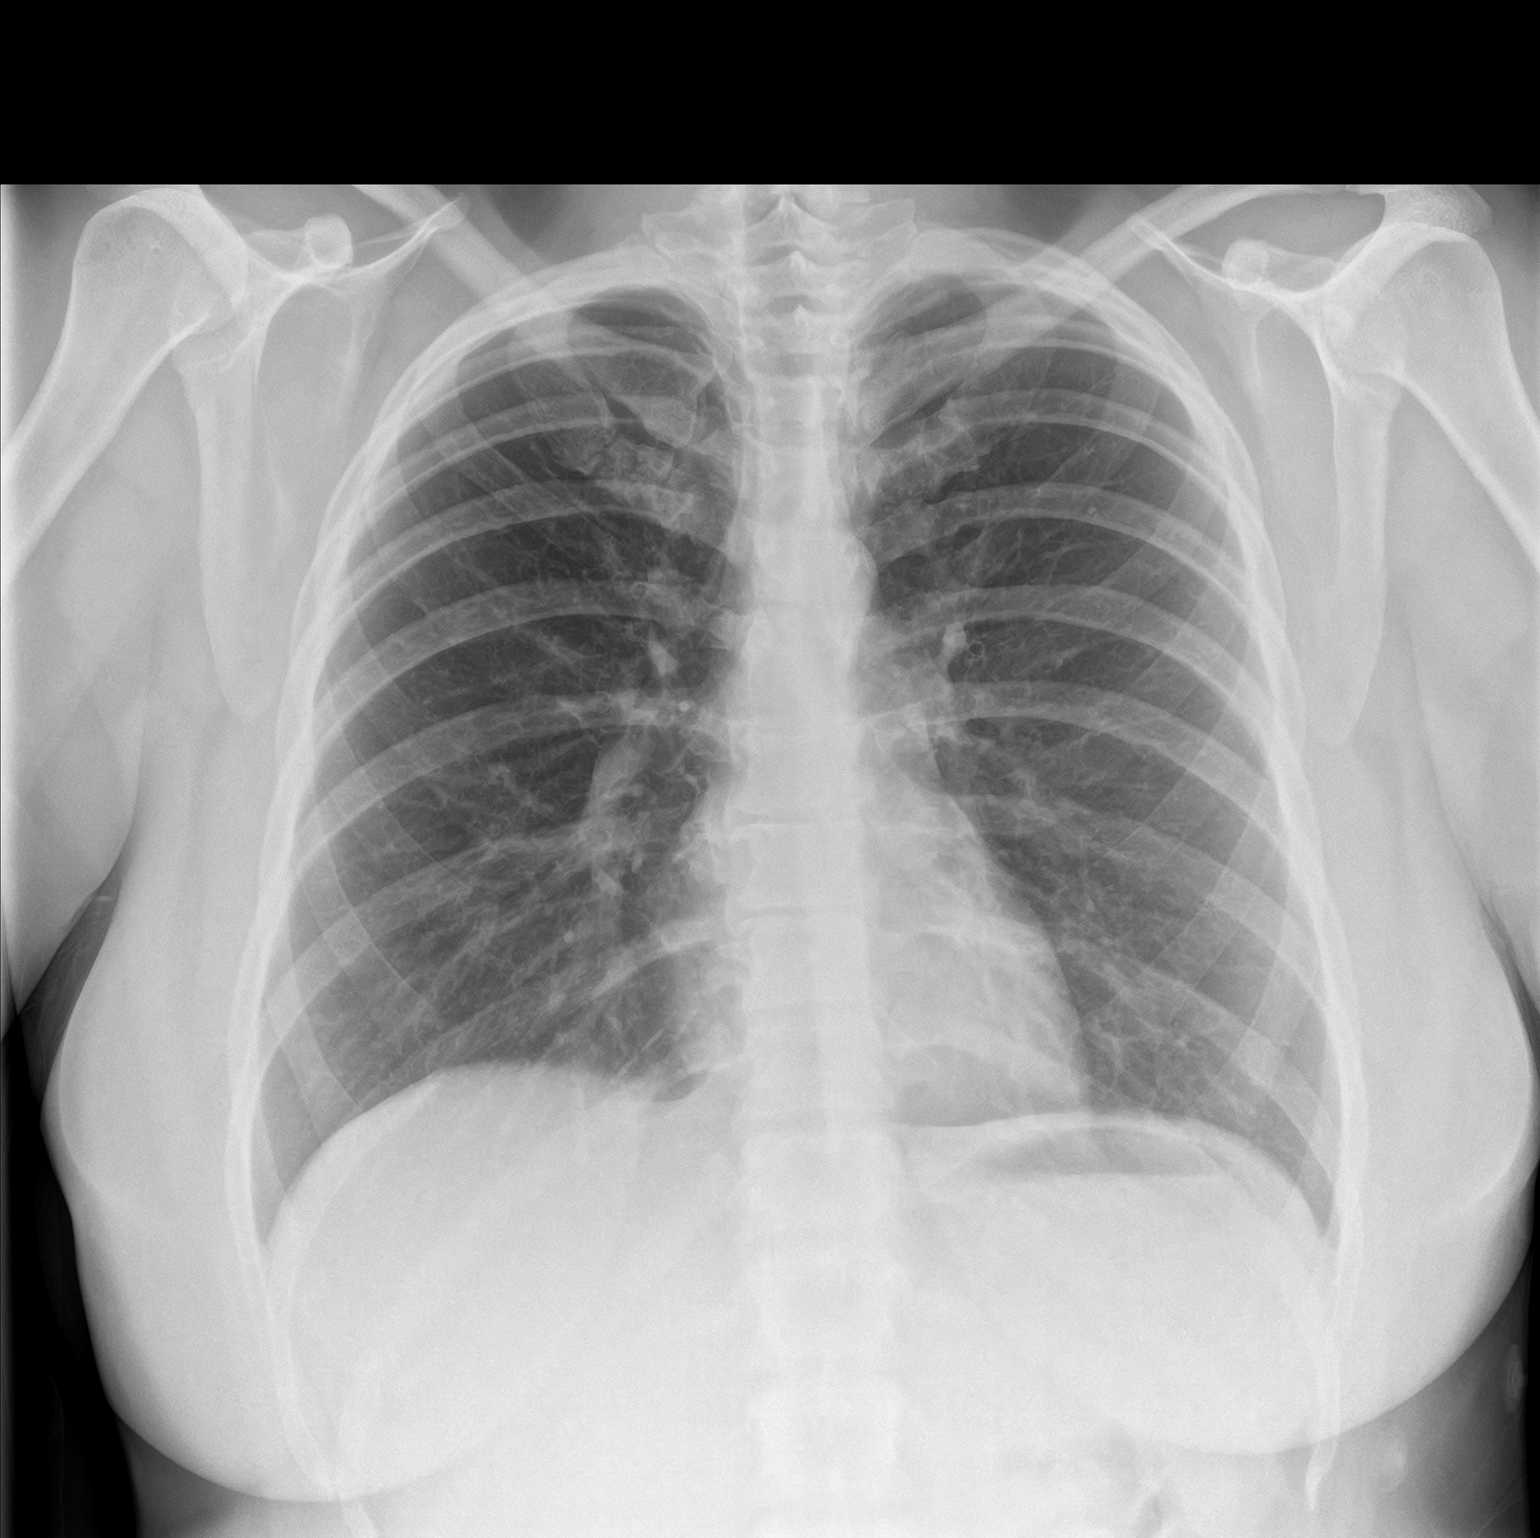
[im 2/2]
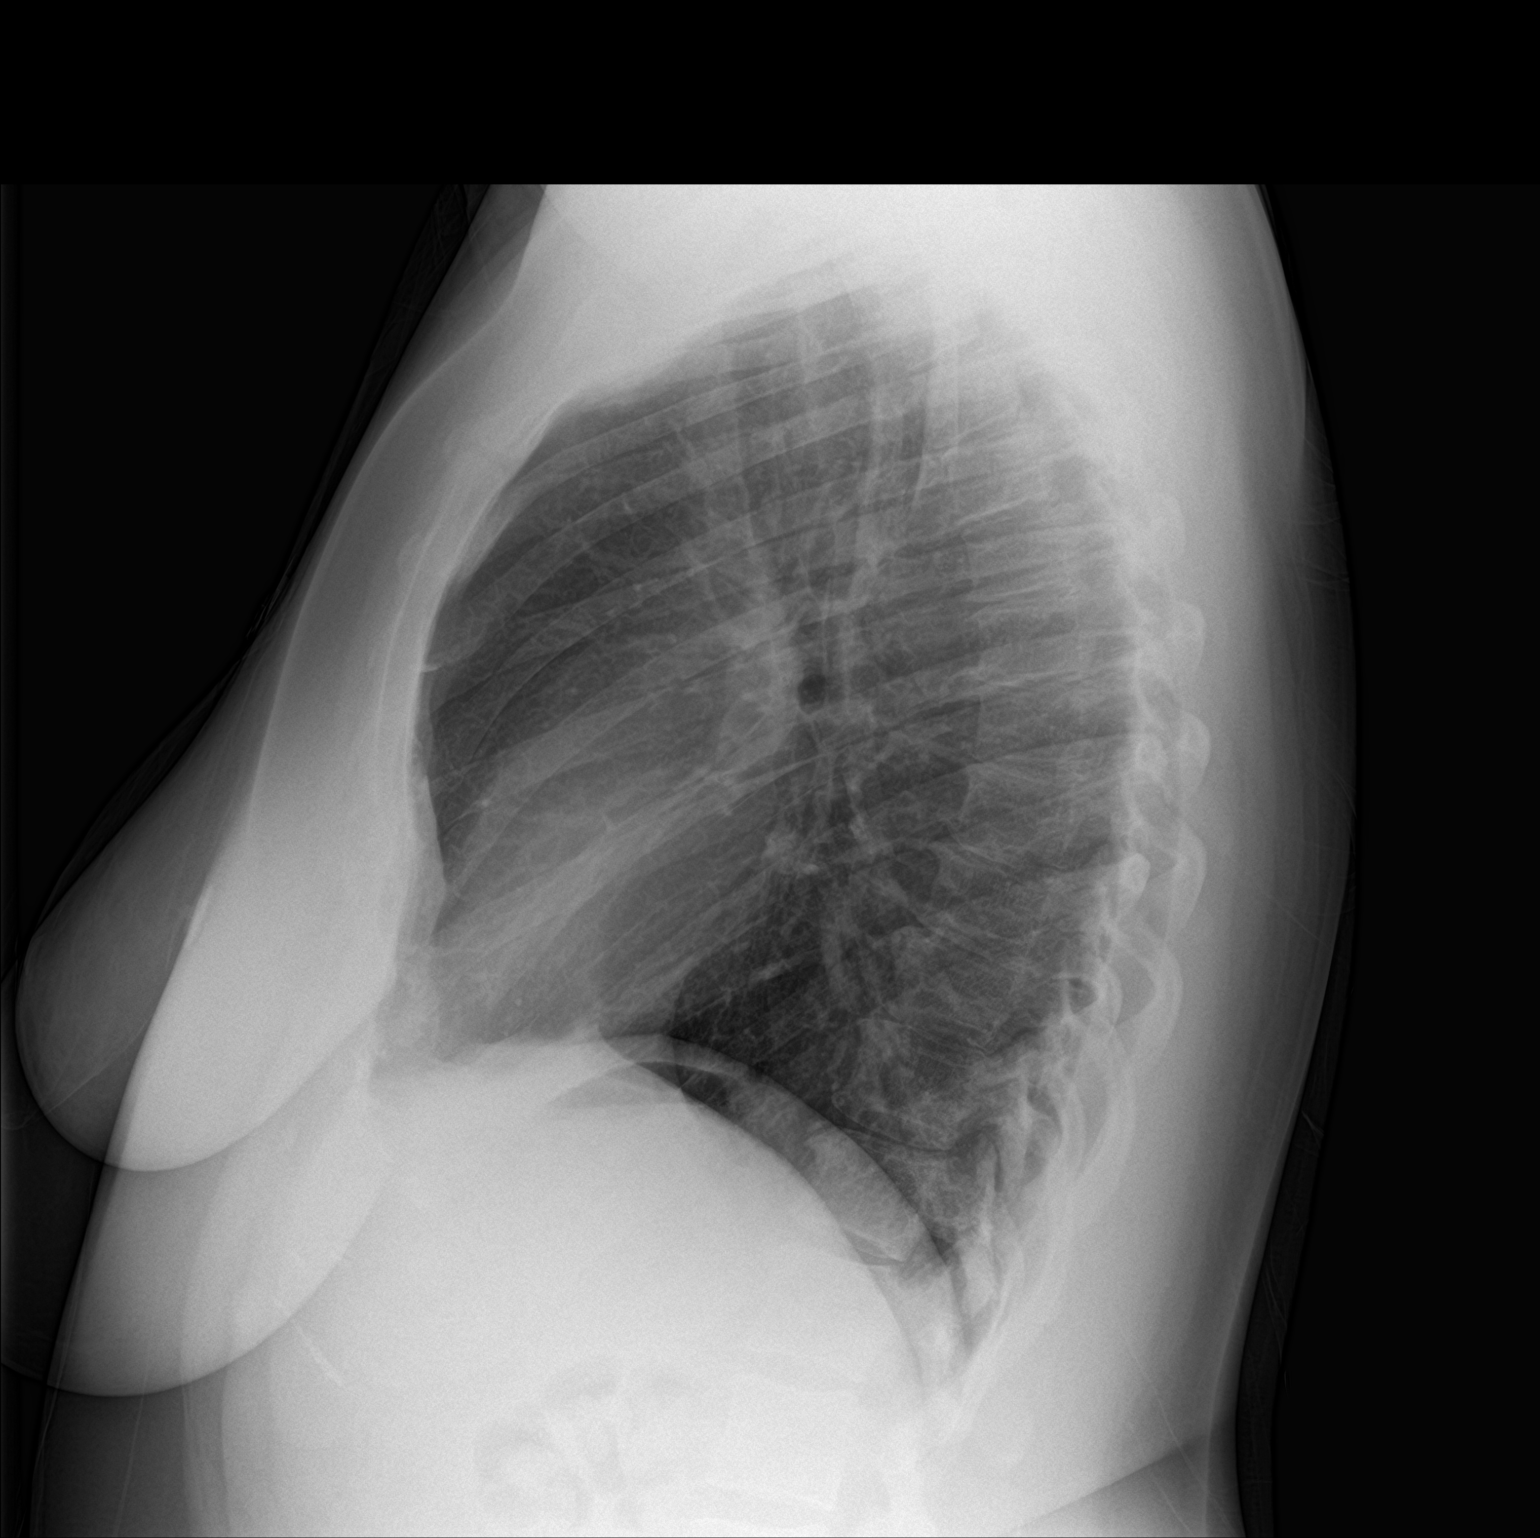

[2 of 2 positions shown; findings below may reference images not displayed]

FINDINGS: The heart size and mediastinal contours are within normal limits.
Both lungs are clear. The visualized skeletal structures are
unremarkable.
IMPRESSION: No active cardiopulmonary disease.

## 2023-05-19 ENCOUNTER — Encounter: Payer: Self-pay | Admitting: Obstetrics and Gynecology

## 2023-05-30 ENCOUNTER — Ambulatory Visit: Payer: Medicaid Other | Admitting: Obstetrics and Gynecology

## 2023-05-30 DIAGNOSIS — R300 Dysuria: Secondary | ICD-10-CM

## 2024-04-20 NOTE — ED Provider Notes (Signed)
 ------------------------------------------------------------------------------- Attestation signed by Candis Camellia Mussel, MD at 04/20/24 [phone] I personally reviewed the note written by the PA and take responsibility for the management plan.  -------------------------------------------------------------------------------  [facility] Emergency Department Provider Note    ED Clinical Impression    Final diagnoses:  Strain of left shoulder, initial encounter (Primary)  Diarrhea, unspecified type        Impression and Progress Notes    Impression, Differential Diagnosis and Plan of Care   Medical Decision Making 33 year old female with PMHx of anxiety, depression and substance abuse (on suboxone ) who presents to the ED for shoulder pain one week and diarrhea for 4 days. Triage VS WNL.   Differential diagnosis includes, but is not limited to: - Left Shoulder: Strain/bursitis/inflammatory process: Left shoulder pain without trauma. Low suspicion for fracture or dislocation. Additionally low suspicion for ACS/MI.  - Diarrhea: Acute diarrhea with lower abdominal pain and nausea in the context of community outbreaks and [facility] use raises suspicion for viral gastroenteritis or medication-induced symptoms, with no evidence for C. difficile or bacterial infection given lack of recent antibiotics and absence of blood in stool. She has no abdominal tenderness on exam to suggest appendicitis, diverticulitis or other emergent abdominal process.  - Dark urine: Dark urine in a patient with prior urethral repair and recurrent infections warrants evaluation for urinary tract infection. No upper tract symptoms to suggest pyelonephritis or stone.   Plan for CBC, CMP, lipase, UA, troponin, TSH, GIPP, Ova and parasite, EKG and XR left shoulder.  Toradol  ordered for pain control, but patient prefers to take ibuprofen  at home at this time.   Results reviewed with patient. Labs and XR reassuring.  Work-up consistent with left shoulder strain. Gentle shoulder exercises provided as tolerated. Her diarrhea is likely from viral process vs possible Wegovy side effect; stool study supplies sent home with patient who will return to lab.  Symptomatic care reviewed. PCP follow-up within one week. Return precautions reviewed.  Medication(s) Prescribed  Discharge Medication List as of 04/20/2024 11:02 AM       Additional Progress Notes  ED Course as of 04/20/24 1332  Tue Apr 20, 2024  0820 EKG: Normal sinus rhythm, rate 70 bpm.  QTc: 427 MS.  No evidence of STEMI.  0935 Lipase: 71  0935 CMP unremarkable   0935 CBC unremarkable   0935 XR Shoulder 3 Or More Views Left IMPRESSION: No acute fracture or dislocation involving the left shoulder.   9050 Troponin I: <0.034  1312 UA unremarkable.   1313 TSH: 2.520    Portions of this record have been created using Scientist, clinical (histocompatibility and immunogenetics). Dictation errors have been sought, but may not have been identified and corrected.  See chart documentation for details.  ____________________________________________      History     Reason for Visit Medical Problem   HPI  History of Present Illness Kendra [name] is a 33 y.o. female with PMHx of anxiety, depression and substance abuse (on suboxone ) who presents to the ED for shoulder pain and diarrhea.   She has been experiencing left shoulder pain that began a few nights ago without any preceding injury. The pain originates in her shoulder and radiates down to her elbow intermittently, sometimes accompanied by tingling in her pinky finger. The pain worsens with movement, such as closing a pen cap or unscrewing a bottle top, and improves with rest. Her mother has a history of similar shoulder pain (family history).  She has also been experiencing diarrhea  for the past four days, with two episodes in the last 24 hours. No blood is noted in her stool. She reports associated mild lower  abdominal pain and darker urine despite adequate hydration. No fever, vomiting, SOB, back pain, urinary symptoms or recent illness in her household. She experiences nausea but no vomiting.  She has a history of anxiety that may exacerbate her symptoms, particularly when experiencing pain. She denies any chest pain.  Her current medications include Suboxone , Zoloft , and Wegovy, which she takes weekly. She took Tylenol  last night, which reduced her shoulder pain from a 9 to a 5 on the pain scale. She has a history of a tubal ligation and denies any possibility of pregnancy. She vapes and has a history of anxiety.   Outside Historian(s) (EMS, Significant Other, Family, Parent, Caregiver, Friend, Patent Examiner, etc.)  None  External Records Reviewed (Inpatient/Outpatient notes, Prior labs/imaging studies, Care Everywhere, PDMP, External ED notes, etc)  None  Past Medical History[1]  Problem List[2]  Past Surgical History[3]  No current facility-administered medications for this encounter.  Current Outpatient Medications:    ARIPiprazole (ABILIFY) 5 MG tablet, Take 1 tablet (5 mg total) by mouth daily., Disp: , Rfl:    buprenorphine -naloxone  (SUBOXONE ) 8-2 mg sublingual film, Place 2 Film (16 mg of buprenorphine  total) under the tongue in the morning., Disp: , Rfl:    diazePAM (VALIUM) 5 MG tablet, Take 1 tablet (5 mg total) by mouth every eight (8) hours as needed for other (dizziness) for up to 5 doses., Disp: 5 tablet, Rfl: 0   olopatadine-mometasone (RYALTRIS) 665-25 mcg/spray Spry, into each nostril Two (2) times a day., Disp: , Rfl:    sertraline  (ZOLOFT ) 50 MG tablet, Take 1 tablet (50 mg total) by mouth in the morning., Disp: , Rfl:    tiZANidine (ZANAFLEX) 2 MG tablet, Take 1 tablet (2 mg total) by mouth every eight (8) hours as needed. Instructions 1/2-1 tablet up to every 8 hours as needed for muscle spasm (Patient not taking: Reported on 04/05/2022), Disp: 30 tablet, Rfl:  0  Allergies Patient has no known allergies.  Family History[4]  Social History Short Social History[5]   Review of Systems  As noted above in HPI.   Physical Exam    ED Triage Vitals [04/20/24 0808]  Enc Vitals Group     BP 143/88     Pulse 72     SpO2 Pulse      Resp 24     Temp 36.2 C (97.1 F)     Temp Source Temporal     SpO2 98 %     Weight (!) 109 kg (240 lb 6.4 oz)     Height 1.702 m (5' 7)     Head Circumference      Peak Flow      Pain Score      Pain Loc      Pain Education      Exclude from Growth Chart     Physical Exam Vitals and nursing note reviewed.  Constitutional:      General: She is not in acute distress.    Appearance: Normal appearance. She is not ill-appearing or toxic-appearing.  HENT:     Head: Normocephalic and atraumatic.     Mouth/Throat:     Mouth: Mucous membranes are moist.  Eyes:     Conjunctiva/sclera: Conjunctivae normal.  Cardiovascular:     Rate and Rhythm: Normal rate and regular rhythm.     Pulses: Normal  pulses.     Heart sounds: Normal heart sounds. No murmur heard. Pulmonary:     Effort: Pulmonary effort is normal. No respiratory distress.     Breath sounds: Normal breath sounds. No wheezing, rhonchi or rales.  Abdominal:     General: Bowel sounds are normal. There is no distension.     Palpations: Abdomen is soft.     Tenderness: There is no abdominal tenderness. There is no right CVA tenderness, left CVA tenderness, guarding or rebound.  Musculoskeletal:        General: Normal range of motion.     Cervical back: Normal range of motion.     Comments: Diffuse tenderness to left anterior shoulder, no overlying skin changes. No crepitus or dislocation appreciated. Mild discomfort with extension against resistance, but FROM. LUE is well perfused.   Skin:    General: Skin is warm.     Capillary Refill: Capillary refill takes less than 2 seconds.  Neurological:     General: No focal deficit present.     Mental  Status: She is alert and oriented to person, place, and time.     Comments: Sensation and strength intact to LUE  Psychiatric:        Mood and Affect: Mood normal.        Behavior: Behavior normal.        Radiology   XR Shoulder 3 Or More Views Left  Final Result  No acute fracture or dislocation involving the left shoulder.       Procedures   None  Medical Decision Making and Critical Care    Independent Interpretation of Studies  I have independently interpreted the following studies: EKG: NSR, no STEMI; see ED course X-ray(s): unremarkable; see ED course   Discussion of Management with other Providers or Support Staff  I discussed the management of this patient with the: None  Considerations Regarding Disposition/Escalation of Care and Critical Care  Indications for observation/admission (or consideration of observation/admission) and/or appropriateness for outpatient management: No indication for hospitalization at this time. Patient/Family/Caregiver Discussions: Results reviewed with patient who expresses understanding and is comfortable with discharge plan.  Diagnostic Tests Considered But Not Done: None Prescription Drugs Provided or Considered But Not Given: None Social Determinants of Health which significantly affected care: None          [1] Past Medical History: Diagnosis Date   Anxiety    Colon polyps    Depression    History of substance abuse (CMS-HCC) 06/22/2018  [2] There is no problem list on file for this patient.  [3] Past Surgical History: Procedure Laterality Date   TUBAL LIGATION  12/03/2021   WISDOM TOOTH EXTRACTION    [4] History reviewed. No pertinent family history. [5] Social History Tobacco Use   Smoking status: Former    Current packs/day: 0.00    Types: Cigarettes    Quit date: 01/11/2023    Years since quitting: 1.2    Passive exposure: Current   Smokeless tobacco: Current   Tobacco comments:    Vapes  Daily  Vaping Use   Vaping status: Every Day   Substances: Nicotine, Flavoring   Devices: Pre-filled or refillable cartridge  Substance Use Topics   Alcohol use: Not Currently   Drug use: Not Currently    Types: Marijuana, Heroin, Opium     Comment: Last Use MJ- 08/12/2021; History of Heroin Use- Last Use in 2019   Vance, Natalie Danielle, [place] 04/20/24 1334

## 2024-06-23 NOTE — ED Triage Notes (Signed)
 Patient reports not being able to swallow, difficulty breathing, throat burning, dizziness that started this morning upon waking.   Denies chest pain, lightheadedness.

## 2024-06-23 NOTE — ED Provider Notes (Signed)
 ------------------------------------------------------------------------------- Attestation signed by Larina Brigid Sella, MD at 06/23/24 1447   Emergency Department Attestation Note   ED Clinical Impression   Final diagnoses:  Pill esophagitis (Primary)    I supervised care provided by the PA. We have discussed the case, I have reviewed the note, and I agree with the plan of treatment except as documented in my note. I have personally performed a face-to-face diagnostic evaluation on this patient. See chart and resident/APP documentation for details. I have reviewed the patient's vital signs and the nursing notes.  Any pertinent labs & imaging results which were available during my care of the patient were reviewed by me.     ED Attending Note   ED Triage Vitals  Enc Vitals Group     BP 06/23/24 1016 146/82     Pulse 06/23/24 1016 76     SpO2 Pulse 06/23/24 1234 76     Resp 06/23/24 1016 18     Temp 06/23/24 1016 37.1 C (98.8 F)     Temp Source 06/23/24 1016 Temporal     SpO2 06/23/24 1016 96 %     Weight --      Height --      Head Circumference --      Peak Flow --      Pain Score --      Pain Loc --      Pain Education --      Exclude from Growth Chart --      See chart and resident/APP documentation for details.  Additional Medical Decision Making   I have reviewed the patient's vital signs and the nursing notes. Any pertinent labs & imaging results which were available during my care of the patient were reviewed by me.  Final diagnoses:  Pill esophagitis (Primary)    [name], MD   June 23, 2024 2:46 PM   -------------------------------------------------------------------------------  [facility] Emergency Department Provider Note    ED Clinical Impression    Final diagnoses:  Pill esophagitis (Primary)        Impression and Progress Notes    Impression, Differential Diagnosis and Plan of Care  Kendra [name] is a 33 y.o. female with PMHx of anxiety, depression, history of substance abuse (on Suboxone ) who presents to the ED for chest burning. Triage VS WNL. Patient has a patent airway. Differential diagnosis includes, but not limited to: esophagitis, esophageal spasm, gastritis, GERD, pancreatitis vs less likely obstruction vs ACS/MI. CBC, CMP and CXR ordered in triage. Will add lipase and HCG. EKG considered, but less likely given history and presentation.   Results reviewed with patient. She feels improved and has been tolerating PO without difficulty. Suspect she experienced pill esophagitis after taking her zoloft  this morning. Offered PPI prior to discharge, but patient prefers to go home and will restart her Prilosec . Symptomatic care reviewed, including diet modification. PCP follow-up within one week. Return precautions reviewed.  Medication(s) Prescribed  Discharge Medication List as of 06/23/2024  1:11 PM       Additional Progress Notes  ED Course as of 06/23/24 1420  Wed Jun 23, 2024  1106 XR Chest 2 views IMPRESSION:   No acute cardiopulmonary abnormalities.     1133 Patient initially reporting chest burning. GI cocktail ordered. Patient then reported nausea. Zofran  ordered.   1154 Lipase: 161  1154 CMP unremarkable   1230 CBC unremarkable   1311 Beta hCG Quant: <5.0    Portions of this record have  been created using Scientist, clinical (histocompatibility and immunogenetics). Dictation errors have been sought, but may not have been identified and corrected.  See chart documentation for details.  ____________________________________________      History     Reason for Visit Indigestion   HPI  History of Present Illness Kendra [name] is a 33 year old female who presents with nausea and vomiting.  She experienced nausea and vomiting after waking up this morning around 3:00 AM and taking her regular medication, Zoloft . Following this, she went back to sleep and upon waking again,  she felt a burning sensation all over, which localized to her throat to her upper abdomen. She currently feels like she is nauseous. She notes some difficulty with swallowing.   No fevers, difficulty breathing, new back pain or urinary symptoms reported. She is unsure about possible pregnancy.  Outside Historian(s) (EMS, Significant Other, Family, Parent, Caregiver, Friend, Patent Examiner, etc.)  Daughter at bedside.  External Records Reviewed (Inpatient/Outpatient notes, Prior labs/imaging studies, Care Everywhere, PDMP, External ED notes, etc)  None  Past Medical History[1]  There is no problem list on file for this patient.[2]  Past Surgical History[3]  No current facility-administered medications for this encounter.  Current Outpatient Medications:    sertraline  (ZOLOFT ) 50 MG tablet, Take 1 tablet (50 mg total) by mouth in the morning., Disp: , Rfl:    ARIPiprazole (ABILIFY) 5 MG tablet, Take 1 tablet (5 mg total) by mouth daily., Disp: , Rfl:    buprenorphine -naloxone  (SUBOXONE ) 8-2 mg sublingual film, Place 2 Film (16 mg of buprenorphine  total) under the tongue in the morning., Disp: , Rfl:    diazePAM (VALIUM) 5 MG tablet, Take 1 tablet (5 mg total) by mouth every eight (8) hours as needed for other (dizziness) for up to 5 doses., Disp: 5 tablet, Rfl: 0   olopatadine-mometasone (RYALTRIS) 665-25 mcg/spray Spry, into each nostril Two (2) times a day., Disp: , Rfl:    tiZANidine (ZANAFLEX) 2 MG tablet, Take 1 tablet (2 mg total) by mouth every eight (8) hours as needed. Instructions 1/2-1 tablet up to every 8 hours as needed for muscle spasm (Patient not taking: Reported on 04/05/2022), Disp: 30 tablet, Rfl: 0  Allergies Patient has no known allergies.  Family History[4]  Social History Short Social History[5]   Review of Systems  As noted above in HPI.   Physical Exam    ED Triage Vitals [06/23/24 1016]  Enc Vitals Group     BP 146/82     Pulse 76      SpO2 Pulse      Resp 18     Temp 37.1 C (98.8 F)     Temp Source Temporal     SpO2 96 %     Weight      Height      Head Circumference      Peak Flow      Pain Score      Pain Loc      Pain Education      Exclude from Growth Chart     Physical Exam Vitals and nursing note reviewed.  Constitutional:      General: She is not in acute distress.    Appearance: Normal appearance. She is not ill-appearing or toxic-appearing.     Comments: Uncomfortable, but nontoxic  HENT:     Head: Normocephalic and atraumatic.     Mouth/Throat:     Mouth: Mucous membranes are moist.     Comments: Patent airway.  Speaking in full sentences. Controlling secretions.  Eyes:     Conjunctiva/sclera: Conjunctivae normal.  Cardiovascular:     Rate and Rhythm: Normal rate and regular rhythm.     Pulses: Normal pulses.     Heart sounds: Normal heart sounds. No murmur heard. Pulmonary:     Effort: Pulmonary effort is normal. No respiratory distress.     Breath sounds: Normal breath sounds. No wheezing, rhonchi or rales.  Chest:     Chest wall: No tenderness.  Abdominal:     General: Bowel sounds are normal. There is no distension.     Palpations: Abdomen is soft.     Tenderness: There is abdominal tenderness (epigastric). There is no guarding or rebound.  Musculoskeletal:        General: Normal range of motion.     Cervical back: Normal range of motion.  Skin:    General: Skin is warm.     Capillary Refill: Capillary refill takes less than 2 seconds.  Neurological:     General: No focal deficit present.     Mental Status: She is alert and oriented to person, place, and time.  Psychiatric:        Mood and Affect: Mood normal.        Behavior: Behavior normal.      Radiology   XR Chest 2 views  Final Result    No acute cardiopulmonary abnormalities.             Procedures   None  Medical Decision Making and Critical Care    Independent Interpretation of Studies  I have  independently interpreted the following studies: X-ray(s): unremarkable; see ED course  Discussion of Management with other Providers or Support Staff  I discussed the management of this patient with the: None  Considerations Regarding Disposition/Escalation of Care and Critical Care  Indications for observation/admission (or consideration of observation/admission) and/or appropriateness for outpatient management: No indication for hospitalization at this time.  Patient/Family/Caregiver Discussions: Results and plan reviewed with patient who expresses understanding and is comfortable with discharge plan.  Diagnostic Tests Considered But Not Done: None Prescription Drugs Provided or Considered But Not Given: None Social Determinants of Health which significantly affected care: None         [1] Past Medical History: Diagnosis Date   Anxiety    Colon polyps    Depression    History of substance abuse (CMS-HCC) 06/22/2018  [2] There is no problem list on file for this patient.  [3] Past Surgical History: Procedure Laterality Date   TUBAL LIGATION  12/03/2021   WISDOM TOOTH EXTRACTION    [4] History reviewed. No pertinent family history. [5] Social History Tobacco Use   Smoking status: Former    Current packs/day: 0.00    Types: Cigarettes    Quit date: 01/11/2023    Years since quitting: 1.4    Passive exposure: Current   Smokeless tobacco: Current   Tobacco comments:    Vapes Daily  Vaping Use   Vaping status: Every Day   Substances: Nicotine, Flavoring   Devices: Pre-filled or refillable cartridge  Substance Use Topics   Alcohol use: Not Currently   Drug use: Not Currently    Types: Marijuana, Heroin, Opium     Comment: Last Use MJ- 08/12/2021; History of Heroin Use- Last Use in 2019   Vance, Natalie Danielle, [place] 06/23/24 1420

## 2024-07-14 NOTE — ED Provider Notes (Signed)
 ------------------------------------------------------------------------------- Attestation signed by Larina Brigid Sella, MD at 07/14/24 1444   Emergency Department Attestation Note   ED Clinical Impression   Final diagnoses:  Syncope, unspecified syncope type (Primary)  Acute nonintractable headache, unspecified headache type  Generalized weakness       I supervised care provided by the PA. We have discussed the case, I have reviewed the note, and I agree with the plan of treatment except as documented in my note. I have personally performed a face-to-face diagnostic evaluation on this patient. See chart and resident/APP documentation for details. I have reviewed the patient's vital signs and the nursing notes.  Any pertinent labs & imaging results which were available during my care of the patient were reviewed by me.   ED Attending Note   ED Triage Vitals [07/14/24 0911]  Enc Vitals Group     BP 149/85     Pulse 77     SpO2 Pulse      Resp 18     Temp 36.6 C (97.9 F)     Temp Source Temporal     SpO2 100 %     Weight (!) 104.3 kg (230 lb)     Height 1.702 m (5' 7)     Head Circumference      Peak Flow      Pain Score      Pain Loc      Pain Education      Exclude from Growth Chart       Additional Medical Decision Making   I have reviewed the patient's vital signs and the nursing notes. Any pertinent labs & imaging results which were available during my care of the patient were reviewed by me.    Final diagnoses:  Syncope, unspecified syncope type (Primary)  Acute nonintractable headache, unspecified headache type  Generalized weakness    [name], MD   July 14, 2024 2:43 PM  Portions of this record have been created using Dragon dictation software. Dictation errors have been sought, but may not have been identified and corrected. -------------------------------------------------------------------------------  [facility] Emergency Department Provider Note    ED Clinical Impression    Final diagnoses:  Syncope, unspecified syncope type (Primary)  Acute nonintractable headache, unspecified headache type  Generalized weakness        Impression and Progress Notes    Impression, Differential Diagnosis and Plan of Care  Medical Decision Making Kendra [name] is a 33 y.o. female with PMHx of anxiety, depression and substance abuse who presents to the ED for syncopal episode yesterday with headache and weakness for one week. Triage VS WNL.   Differential diagnosis includes, but is not limited to:  - Autonomic dysfunction (including POTS): Given the recurrent nature of her syncope since childhood, absence of clear cardiac or neurological triggers, and prior negative cardiac workup, autonomic dysfunction such as POTS remains a consideration. - Arrhythmia: Has had previous testing performed, but given new symptoms will consider.  - Dehydration - Vasovagal syncope  - Viral URI: given prevalence in the community at this time  - Seizure: Seizure is less likely given the absence of witnessed convulsive activity, postictal confusion, or incontinence during the episode. - Intracranial pathology (e.g., mass, hemorrhage, stroke): Intracranial pathology is considered due to new head pressure  Plan for CBC, CMP, troponin, TSH, 4plex and CT head. EKG and upreg completed yesterday and reviewed. Patient declines anything for her head pressure.  Results reviewed with patient. Work-up reassuring, patient reassured.  Given unclear cause of syncope, will discharge with Ziopatch to evaluated for underlying arrhythmia. Symptomatic care reviewed. PCP follow-up within one week. Return precautions reviewed.  Medication(s) Prescribed  Discharge Medication List as of 07/14/2024 11:03 AM       Additional Progress Notes  ED Course as of 07/14/24 1402  Wed Jul 14, 2024  1021 CMP unremarkable  1021 CBC  unremarkable  1022 EKG from yesterday; NSR, rate: 66 bpm. QTc: . No ischemic changes or arrhythmia noted.   1036 Troponin I: <0.034  1045 SARS-CoV-2 PCR: Negative  1045 Influenza A: Negative  1045 Influenza B: Negative  1045 RSV: Negative  1056 CT head without contrast IMPRESSION: No acute intracranial abnormality.     1056 TSH: 1.250  1402 Orthostats unremarkable     Portions of this record have been created using Scientist, clinical (histocompatibility and immunogenetics). Dictation errors have been sought, but may not have been identified and corrected.  See chart documentation for details.  ____________________________________________      History     Reason for Visit Weakness   HPI  History of Present Illness Kendra [name] is a 33 y.o. female with PMHx of anxiety, depression and substance abuse who presents to the ED for syncopal episode and weakness.  Patient attempted to present to the ED yesterday, but fortunately left without being seen after triage due to busy ED.   Patient reports she experienced a fainting episode yesterday at home while walking to the laundry room. She was not engaged in any strenuous activity, no bending over. Her husband witnessed the event, noting she was unconscious for approximately 30 to 45 seconds. Upon regaining consciousness, she was groggy but did not experience seizures, incontinence, or significant injury/head injury, though she had some redness on her elbows from the fall. No chest pain, palpitations, dizziness or trouble breathing preceded the fainting episode.   For the past week, she has experienced constant pressure in her head, which began before the fainting episode. This pressure is accompanied by intermittent blurry vision, occurring both with and without her glasses. She also reports brain fog and occasional dizziness, though she is unsure if these symptoms are related to the head pressure.  She describes weakness in her legs, with a sensation  as if her knees might buckle, leading to a 'stumbly' feeling. Despite this, she is still able to walk. She denies engaging in any vigorous physical activity that might explain the weakness and is unsure of the cause.  She has a history of fainting episodes since childhood, starting around age 12. She states she typically has a warning that she is going to have one. She has undergone various tests, including wearing a heart monitor and stress tests several years ago, which have not revealed any significant abnormalities. She is currently taking vitamin D daily and has no new medications. Family history includes mini strokes. She states she has never had head pressure like this or syncope without preceding any warning symptoms.   No fever, chest pain, difficulty breathing, back pain, loss of bladder/bowel function or urinary symptoms.   Outside Historian(s) (EMS, Significant Other, Family, Parent, Caregiver, Friend, Patent Examiner, etc.)  None  External Records Reviewed (Inpatient/Outpatient notes, Prior labs/imaging studies, Care Everywhere, PDMP, External ED notes, etc)  None  Past Medical History[1]  There is no problem list on file for this patient.[2]  Past Surgical History[3]  No current facility-administered medications for this encounter.  Current Outpatient Medications:    ARIPiprazole (ABILIFY) 5 MG tablet, Take  1 tablet (5 mg total) by mouth daily., Disp: , Rfl:    buprenorphine -naloxone  (SUBOXONE ) 8-2 mg sublingual film, Place 2 Film (16 mg of buprenorphine  total) under the tongue in the morning., Disp: , Rfl:    diazePAM (VALIUM) 5 MG tablet, Take 1 tablet (5 mg total) by mouth every eight (8) hours as needed for other (dizziness) for up to 5 doses., Disp: 5 tablet, Rfl: 0   olopatadine-mometasone (RYALTRIS) 665-25 mcg/spray Spry, into each nostril Two (2) times a day., Disp: , Rfl:    sertraline  (ZOLOFT ) 50 MG tablet, Take 1 tablet (50 mg total) by mouth in the  morning., Disp: , Rfl:    tiZANidine (ZANAFLEX) 2 MG tablet, Take 1 tablet (2 mg total) by mouth every eight (8) hours as needed. Instructions 1/2-1 tablet up to every 8 hours as needed for muscle spasm (Patient not taking: Reported on 04/05/2022), Disp: 30 tablet, Rfl: 0  Allergies Patient has no known allergies.  Family History[4]  Social History Short Social History[5]   Review of Systems  As noted above in HPI.   Physical Exam    ED Triage Vitals [07/14/24 0911]  Enc Vitals Group     BP 149/85     Pulse 77     SpO2 Pulse      Resp 18     Temp 36.6 C (97.9 F)     Temp Source Temporal     SpO2 100 %     Weight (!) 104.3 kg (230 lb)     Height 1.702 m (5' 7)     Head Circumference      Peak Flow      Pain Score      Pain Loc      Pain Education      Exclude from Growth Chart     Physical Exam Vitals and nursing note reviewed.  Constitutional:      General: She is not in acute distress.    Appearance: Normal appearance. She is not ill-appearing or toxic-appearing.  HENT:     Head: Normocephalic and atraumatic.     Right Ear: Tympanic membrane normal.     Left Ear: Tympanic membrane normal.     Mouth/Throat:     Mouth: Mucous membranes are moist.  Eyes:     Extraocular Movements: Extraocular movements intact.     Conjunctiva/sclera: Conjunctivae normal.     Pupils: Pupils are equal, round, and reactive to light.  Cardiovascular:     Rate and Rhythm: Normal rate and regular rhythm.     Pulses: Normal pulses.     Heart sounds: Normal heart sounds. No murmur heard. Pulmonary:     Effort: Pulmonary effort is normal. No respiratory distress.     Breath sounds: Normal breath sounds. No wheezing, rhonchi or rales.  Abdominal:     General: Bowel sounds are normal. There is no distension.     Palpations: Abdomen is soft.     Tenderness: There is no abdominal tenderness. There is no guarding or rebound.  Musculoskeletal:        General: Normal range of motion.      Cervical back: Normal range of motion. No rigidity or tenderness.     Right lower leg: No edema.     Left lower leg: No edema.     Comments: No midline spinal tenderness, stepoffs or deformities. Extremities nontender with FROM.  Lymphadenopathy:     Cervical: No cervical adenopathy.  Skin:    General:  Skin is warm.     Capillary Refill: Capillary refill takes less than 2 seconds.  Neurological:     General: No focal deficit present.     Mental Status: She is alert and oriented to person, place, and time.     Cranial Nerves: No cranial nerve deficit.     Comments: A&Ox3 Speech normal cadence and fluent CNs II-XII intact and symmetric No nystagmus on smooth pursuit Strength 5/5 in flexion/extension, symmetric x 4 SILT No dysmetria Rapid alternating movements intact  No protnator drift, negative rhomberg NL Gait   Psychiatric:        Mood and Affect: Mood normal.        Behavior: Behavior normal.      Radiology   CT head without contrast  Final Result  No acute intracranial abnormality.           Procedures   None  Medical Decision Making and Critical Care    Independent Interpretation of Studies  I have independently interpreted the following studies: EKG: reviewed from yesterday. NSR, no STEMI; see ED course  CT/MRI(s): unremarkable; see ED course   Discussion of Management with other Providers or Support Staff  I discussed the management of this patient with the: None  Considerations Regarding Disposition/Escalation of Care and Critical Care  Indications for observation/admission (or consideration of observation/admission) and/or appropriateness for outpatient management: No indication for hospitalization at this time.  Patient/Family/Caregiver Discussions: Results and plan reviewed with patient who expresses understanding and is comfortable with discharge plan.  Diagnostic Tests Considered But Not Done: None Prescription Drugs Provided or  Considered But Not Given: None Social Determinants of Health which significantly affected care: None         [1] Past Medical History: Diagnosis Date   Anxiety    Colon polyps    Depression    History of substance abuse (CMS-HCC) 06/22/2018  [2] There is no problem list on file for this patient.  [3] Past Surgical History: Procedure Laterality Date   TUBAL LIGATION  12/03/2021   WISDOM TOOTH EXTRACTION    [4] History reviewed. No pertinent family history. [5] Social History Tobacco Use   Smoking status: Former    Current packs/day: 0.00    Types: Cigarettes    Quit date: 01/11/2023    Years since quitting: 1.5    Passive exposure: Current   Smokeless tobacco: Current   Tobacco comments:    Vapes Daily  Vaping Use   Vaping status: Every Day   Substances: Nicotine, Flavoring   Devices: Pre-filled or refillable cartridge  Substance Use Topics   Alcohol use: Not Currently   Drug use: Not Currently    Types: Marijuana, Heroin, Opium     Comment: Last Use MJ- 08/12/2021; History of Heroin Use- Last Use in 2019   Kendra [name], Kendra [name] 07/14/24 [phone]

## 2024-08-01 ENCOUNTER — Inpatient Hospital Stay
Admission: EM | Admit: 2024-08-01 | Discharge: 2024-08-04 | DRG: 390 | Disposition: A | Discharge status: Home / Self Care | Attending: Surgery | Admitting: Surgery

## 2024-08-01 ENCOUNTER — Other Ambulatory Visit: Payer: Self-pay

## 2024-08-01 ENCOUNTER — Emergency Department

## 2024-08-01 ENCOUNTER — Inpatient Hospital Stay

## 2024-08-01 DIAGNOSIS — K219 Gastro-esophageal reflux disease without esophagitis: Secondary | ICD-10-CM | POA: Diagnosis present

## 2024-08-01 DIAGNOSIS — Z7689 Persons encountering health services in other specified circumstances: Secondary | ICD-10-CM

## 2024-08-01 DIAGNOSIS — Z8249 Family history of ischemic heart disease and other diseases of the circulatory system: Secondary | ICD-10-CM | POA: Diagnosis not present

## 2024-08-01 DIAGNOSIS — F1120 Opioid dependence, uncomplicated: Secondary | ICD-10-CM

## 2024-08-01 DIAGNOSIS — E559 Vitamin D deficiency, unspecified: Secondary | ICD-10-CM | POA: Diagnosis present

## 2024-08-01 DIAGNOSIS — F32A Depression, unspecified: Secondary | ICD-10-CM | POA: Diagnosis present

## 2024-08-01 DIAGNOSIS — Z9851 Tubal ligation status: Secondary | ICD-10-CM

## 2024-08-01 DIAGNOSIS — F419 Anxiety disorder, unspecified: Secondary | ICD-10-CM | POA: Diagnosis present

## 2024-08-01 DIAGNOSIS — G90A Postural orthostatic tachycardia syndrome (POTS): Secondary | ICD-10-CM | POA: Diagnosis present

## 2024-08-01 DIAGNOSIS — Z818 Family history of other mental and behavioral disorders: Secondary | ICD-10-CM

## 2024-08-01 DIAGNOSIS — R197 Diarrhea, unspecified: Secondary | ICD-10-CM

## 2024-08-01 DIAGNOSIS — K56609 Unspecified intestinal obstruction, unspecified as to partial versus complete obstruction: Secondary | ICD-10-CM | POA: Diagnosis not present

## 2024-08-01 DIAGNOSIS — Z79899 Other long term (current) drug therapy: Secondary | ICD-10-CM

## 2024-08-01 DIAGNOSIS — K5651 Intestinal adhesions [bands], with partial obstruction: Principal | ICD-10-CM | POA: Diagnosis present

## 2024-08-01 DIAGNOSIS — Z87891 Personal history of nicotine dependence: Secondary | ICD-10-CM

## 2024-08-01 DIAGNOSIS — Z833 Family history of diabetes mellitus: Secondary | ICD-10-CM

## 2024-08-01 DIAGNOSIS — R109 Unspecified abdominal pain: Secondary | ICD-10-CM | POA: Diagnosis present

## 2024-08-01 DIAGNOSIS — F1121 Opioid dependence, in remission: Secondary | ICD-10-CM | POA: Diagnosis present

## 2024-08-01 DIAGNOSIS — R1033 Periumbilical pain: Secondary | ICD-10-CM

## 2024-08-01 DIAGNOSIS — R112 Nausea with vomiting, unspecified: Principal | ICD-10-CM

## 2024-08-01 HISTORY — DX: Opioid use, unspecified, in remission: F11.91

## 2024-08-01 LAB — CBC WITH DIFFERENTIAL/PLATELET
Abs Immature Granulocytes: 0.04 K/uL (ref 0.00–0.07)
Basophils Absolute: 0 K/uL (ref 0.0–0.1)
Basophils Relative: 0 %
Eosinophils Absolute: 0 K/uL (ref 0.0–0.5)
Eosinophils Relative: 0 %
HCT: 45.2 % (ref 36.0–46.0)
Hemoglobin: 15.3 g/dL — ABNORMAL HIGH (ref 12.0–15.0)
Immature Granulocytes: 0 %
Lymphocytes Relative: 7 %
Lymphs Abs: 1 K/uL (ref 0.7–4.0)
MCH: 33.7 pg (ref 26.0–34.0)
MCHC: 33.8 g/dL (ref 30.0–36.0)
MCV: 99.6 fL (ref 80.0–100.0)
Monocytes Absolute: 0.3 K/uL (ref 0.1–1.0)
Monocytes Relative: 3 %
Neutro Abs: 12 K/uL — ABNORMAL HIGH (ref 1.7–7.7)
Neutrophils Relative %: 90 %
Platelets: 322 K/uL (ref 150–400)
RBC: 4.54 MIL/uL (ref 3.87–5.11)
RDW: 12 % (ref 11.5–15.5)
WBC: 13.3 K/uL — ABNORMAL HIGH (ref 4.0–10.5)
nRBC: 0 % (ref 0.0–0.2)

## 2024-08-01 LAB — COMPREHENSIVE METABOLIC PANEL WITH GFR
ALT: 12 U/L (ref 0–44)
AST: 16 U/L (ref 15–41)
Albumin: 4.9 g/dL (ref 3.5–5.0)
Alkaline Phosphatase: 65 U/L (ref 38–126)
Anion gap: 12 (ref 5–15)
BUN: 12 mg/dL (ref 6–20)
CO2: 26 mmol/L (ref 22–32)
Calcium: 10.5 mg/dL — ABNORMAL HIGH (ref 8.9–10.3)
Chloride: 102 mmol/L (ref 98–111)
Creatinine, Ser: 0.89 mg/dL (ref 0.44–1.00)
GFR, Estimated: >60 mL/min
Glucose, Bld: 140 mg/dL — ABNORMAL HIGH (ref 70–99)
Potassium: 4 mmol/L (ref 3.5–5.1)
Sodium: 139 mmol/L (ref 135–145)
Total Bilirubin: 0.7 mg/dL (ref 0.0–1.2)
Total Protein: 8.5 g/dL — ABNORMAL HIGH (ref 6.5–8.1)

## 2024-08-01 LAB — URINALYSIS, COMPLETE (UACMP) WITH MICROSCOPIC
Bilirubin Urine: NEGATIVE
Glucose, UA: NEGATIVE mg/dL
Hgb urine dipstick: NEGATIVE
Ketones, ur: NEGATIVE mg/dL
Leukocytes,Ua: NEGATIVE
Nitrite: NEGATIVE
Protein, ur: NEGATIVE mg/dL
Specific Gravity, Urine: 1.027 (ref 1.005–1.030)
pH: 7 (ref 5.0–8.0)

## 2024-08-01 LAB — LIPASE, BLOOD: Lipase: 35 U/L (ref 11–51)

## 2024-08-01 LAB — POC URINE PREG, ED: Preg Test, Ur: NEGATIVE

## 2024-08-01 MED ORDER — ONDANSETRON 4 MG PO TBDP: 4.0000 mg | ORAL_TABLET | Freq: Four times a day (QID) | ORAL | Status: DC | PRN

## 2024-08-01 MED ORDER — ENOXAPARIN SODIUM 60 MG/0.6ML IJ SOSY
0.5000 mg/kg | PREFILLED_SYRINGE | INTRAMUSCULAR | Status: DC
  Filled 2024-08-01 (×2): qty 0.6

## 2024-08-01 MED ORDER — LACTATED RINGERS IV SOLN: INTRAVENOUS | Status: DC

## 2024-08-01 MED ORDER — MECLIZINE HCL 25 MG PO TABS: 25.0000 mg | ORAL_TABLET | Freq: Three times a day (TID) | ORAL | Status: DC | PRN

## 2024-08-01 MED ORDER — BENZOCAINE 20 % MT SOLN
1.0000 | Freq: Once | OROMUCOSAL | Status: AC
  Administered 2024-08-01: 1 via OROMUCOSAL
  Filled 2024-08-01: qty 1

## 2024-08-01 MED ORDER — FENTANYL CITRATE (PF) 50 MCG/ML IJ SOSY
50.0000 ug | PREFILLED_SYRINGE | Freq: Once | INTRAMUSCULAR | Status: DC
  Filled 2024-08-01: qty 1

## 2024-08-01 MED ORDER — POLYETHYLENE GLYCOL 3350 17 G PO PACK: 17.0000 g | PACK | Freq: Every day | ORAL | Status: DC | PRN

## 2024-08-01 MED ORDER — PANTOPRAZOLE SODIUM 40 MG IV SOLR
40.0000 mg | Freq: Every day | INTRAVENOUS | Status: DC
  Administered 2024-08-01 – 2024-08-03 (×3): 40 mg via INTRAVENOUS
  Filled 2024-08-01 (×3): qty 10

## 2024-08-01 MED ORDER — MORPHINE SULFATE (PF) 2 MG/ML IV SOLN: 2.0000 mg | INTRAVENOUS | Status: DC | PRN

## 2024-08-01 MED ORDER — ONDANSETRON HCL 4 MG/2ML IJ SOLN
4.0000 mg | Freq: Once | INTRAMUSCULAR | Status: AC
  Administered 2024-08-01: 4 mg via INTRAVENOUS
  Filled 2024-08-01: qty 2

## 2024-08-01 MED ORDER — ONDANSETRON HCL 4 MG/2ML IJ SOLN: 4.0000 mg | Freq: Four times a day (QID) | INTRAMUSCULAR | Status: DC | PRN

## 2024-08-01 MED ORDER — SERTRALINE HCL 50 MG PO TABS
50.0000 mg | ORAL_TABLET | Freq: Every day | ORAL | Status: DC
  Administered 2024-08-02 – 2024-08-04 (×3): 50 mg via ORAL
  Filled 2024-08-01 (×3): qty 1

## 2024-08-01 MED ORDER — SODIUM CHLORIDE 0.9 % IV BOLUS
1000.0000 mL | Freq: Once | INTRAVENOUS | Status: AC
  Administered 2024-08-01: 1000 mL via INTRAVENOUS

## 2024-08-01 MED ORDER — FAMOTIDINE IN NACL 20-0.9 MG/50ML-% IV SOLN
20.0000 mg | Freq: Once | INTRAVENOUS | Status: AC
  Administered 2024-08-01: 20 mg via INTRAVENOUS
  Filled 2024-08-01: qty 50

## 2024-08-01 MED ORDER — IOHEXOL 300 MG/ML  SOLN
100.0000 mL | Freq: Once | INTRAMUSCULAR | Status: AC | PRN
  Administered 2024-08-01: 100 mL via INTRAVENOUS

## 2024-08-01 MED ORDER — KETOROLAC TROMETHAMINE 30 MG/ML IJ SOLN
30.0000 mg | Freq: Four times a day (QID) | INTRAMUSCULAR | Status: DC | PRN
  Administered 2024-08-01: 30 mg via INTRAVENOUS
  Filled 2024-08-01: qty 1

## 2024-08-01 MED ORDER — ACETAMINOPHEN 500 MG PO TABS
1000.0000 mg | ORAL_TABLET | Freq: Four times a day (QID) | ORAL | Status: DC | PRN
  Administered 2024-08-02 – 2024-08-04 (×4): 1000 mg via ORAL
  Filled 2024-08-01 (×4): qty 2

## 2024-08-01 MED ORDER — BUPRENORPHINE HCL-NALOXONE HCL 8-2 MG SL SUBL
1.0000 | SUBLINGUAL_TABLET | Freq: Two times a day (BID) | SUBLINGUAL | Status: DC
  Administered 2024-08-01 – 2024-08-04 (×7): 1 via SUBLINGUAL
  Filled 2024-08-01 (×7): qty 1

## 2024-08-01 MED ORDER — ACETAMINOPHEN 10 MG/ML IV SOLN
1000.0000 mg | Freq: Once | INTRAVENOUS | Status: AC
  Administered 2024-08-01: 1000 mg via INTRAVENOUS
  Filled 2024-08-01 (×2): qty 100

## 2024-08-01 MED ORDER — MORPHINE SULFATE (PF) 4 MG/ML IV SOLN: 4.0000 mg | Freq: Once | INTRAVENOUS | Status: DC

## 2024-08-01 MED ORDER — LIDOCAINE HCL URETHRAL/MUCOSAL 2 % EX GEL
1.0000 | Freq: Once | CUTANEOUS | Status: AC
  Administered 2024-08-01: 1
  Filled 2024-08-01: qty 10

## 2024-08-01 MED ORDER — KETOROLAC TROMETHAMINE 15 MG/ML IJ SOLN
15.0000 mg | Freq: Once | INTRAMUSCULAR | Status: AC
  Administered 2024-08-01: 15 mg via INTRAVENOUS
  Filled 2024-08-01: qty 1

## 2024-08-01 NOTE — ED Notes (Signed)
Dr. Piscoya at bedside  

## 2024-08-01 NOTE — Plan of Care (Signed)

## 2024-08-01 NOTE — ED Provider Notes (Signed)
 "  [facility] Provider Note    Event Date/Time   First MD Initiated Contact with Patient 08/01/24 [phone]     (approximate)   History   Abdominal Pain   HPI  Kendra [name] is a 34 y.o. 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  Patient states that she feels as if she is dying.  Denies any dysuria, but has been having nonbloody diarrhea. Denies any vaginal discharge or bleeding and has no concern for sexually transmitted diseases.  She arrives by ambulance and had normal vital signs and route     Physical Exam   Triage Vital Signs: ED Triage Vitals  Encounter Vitals Group     BP 08/01/24 0540 118/83     Girls Systolic BP Percentile --      Girls Diastolic BP Percentile --      Boys Systolic BP Percentile --      Boys Diastolic BP Percentile --      Pulse Rate 08/01/24 0540 77     Resp 08/01/24 0540 18     Temp 08/01/24 0540 98.4 F (36.9 C)     Temp Source 08/01/24 0540 Oral     SpO2 08/01/24 0540 99 %     Weight 08/01/24 0537 230 lb (104.3 kg)     Height 08/01/24 0537 5' 7 (1.702 m)     Head Circumference --      Peak Flow --      Pain Score 08/01/24 0536 7     Pain Loc --      Pain Education --      Exclude from Growth Chart --     Most recent vital signs: Vitals:   08/01/24 0540  BP: 118/83  Pulse: 77  Resp: 18  Temp: 98.4 F (36.9 C)  SpO2: 99%    Nursing Triage Note reviewed. Vital signs reviewed and patients oxygen saturation is normoxic  General: Patient is well nourished, well developed, awake and alert, crying out holding her stomach Head: Normocephalic and atraumatic Eyes: Normal inspection, extraocular muscles intact, no conjunctival pallor Ear, nose, throat: Normal external exam Neck: Normal range of motion Respiratory: Patient is in no respiratory distress, lungs CTAB Cardiovascular: Patient is not  tachycardic, RR GI: Abd soft, tender to palpation periumbilically with no rebound but there is guarding.  No CVA tenderness Back: Normal inspection of the back with good strength and range of motion throughout all ext Extremities: pulses intact with good cap refills, no LE pitting edema or calf tenderness Neuro: The patient is alert and oriented to person, place, and time, appropriately conversive, with 5/5 bilat UE/LE strength, no gross motor or sensory defects noted. Coordination appears to be adequate. Skin: Warm, dry, and intact Psych: Anxious mood and affect, no SI or HI  ED Results / Procedures / Treatments   Labs (all labs ordered are listed, but only abnormal results are displayed) Labs Reviewed  URINALYSIS, COMPLETE (UACMP) WITH MICROSCOPIC  CBC WITH DIFFERENTIAL/PLATELET  COMPREHENSIVE METABOLIC PANEL WITH GFR  LIPASE, BLOOD  URINE DRUG SCREEN     NoneEKG   RADIOLOGY CT abdomen pelvis with IV contrast: Pending    PROCEDURES:  Critical Care performed: No  Procedures   MEDICATIONS ORDERED IN ED: Medications  sodium chloride  0.9 % bolus 1,000 mL (has no administration in time range)  ondansetron  (ZOFRAN ) injection 4 mg (has no administration in  time range)  famotidine  (PEPCID ) IVPB 20 mg premix (has no administration in time range)  ketorolac  (TORADOL ) 15 MG/ML injection 15 mg (has no administration in time range)     IMPRESSION / MDM / ASSESSMENT AND PLAN / ED COURSE                                Differential diagnosis includes, but is not limited to, appendicitis, UTI, pregnancy, colitis, substance use, electrolyte derangement, opioid withdrawal  ED course: Patient presents and her abdomen demonstrates no evidence of peritonitis however she does have evidence of recent emesis.  I am reassured that she is not tachycardic or hypotensive.  Will initiate IV fluids Zofran  and ketorolac  and Pepcid .  Will obtain blood work evaluating for intra-abdominal causes  and also urinalysis and pregnancy test.  Patient has not had a CT abdomen for 6 months and she is very concerned that there may be something wrong internally.  Given that she is demonstrating active emesis, will obtain CT abdomen pelvis given patient's request.  Patient will need to be signed out to oncoming physician   Clinical Course as of 08/01/24 0705  [place] Aug 01, 2024  0700 Signed out pending labs, CT. Periumbilical abd pain, nv [EB]    Clinical Course User Index [EB] Jossie Artist POUR, MD   -- Risk: 5 This patient has a high risk of morbidity due to further diagnostic testing or treatment. Rationale: This patients evaluation and management involve a high risk of morbidity due to the potential severity of presenting symptoms, need for diagnostic testing, and/or initiation of treatment that may require close monitoring. The differential includes conditions with potential for significant deterioration or requiring escalation of care. Treatment decisions in the ED, including medication administration, procedural interventions, or disposition planning, reflect this level of risk. COPA: 5 The patient has the following acute or chronic illness/injury that poses a possible threat to life or bodily function: [X] : The patient has a potentially serious acute condition or an acute exacerbation of a chronic illness requiring urgent evaluation and management in the Emergency Department. The clinical presentation necessitates immediate consideration of life-threatening or function-threatening diagnoses, even if they are ultimately ruled out.   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   Nicholaus Rolland BRAVO, MD 08/01/24 [phone]  "

## 2024-08-01 NOTE — Progress Notes (Signed)
 PHARMACIST - PHYSICIAN COMMUNICATION  CONCERNING:  Enoxaparin  (Lovenox ) for DVT Prophylaxis    RECOMMENDATION: Patient was prescribed enoxaprin 40mg  q24 hours for VTE prophylaxis.   Filed Weights   08/01/24 0537  Weight: 104.3 kg (230 lb)    Body mass index is 36.02 kg/m.  Estimated Creatinine Clearance: 112.7 mL/min (by C-G formula based on SCr of 0.89 mg/dL).   Based on [facility] policy patient is candidate for enoxaparin  0.5mg /kg TBW SQ every 24 hours based on BMI being >30.  DESCRIPTION: Pharmacy has adjusted enoxaparin  dose per [facility] policy.  Patient is now receiving enoxaparin  52.5 mg every 24 hours    Damien Napoleon, PharmD Clinical Pharmacist  08/01/2024 10:48 AM

## 2024-08-01 NOTE — H&P (Signed)
 " Date of Admission:  08/01/2024  Reason for Admission: Small bowel obstruction  History of Present Illness: Kendra [name] is a 34 y.o. female presenting for evaluation of abdominal pain associated with nausea and vomiting.  Patient reports the pain started yesterday and continued to progress and get worse.  She has had multiple episodes of emesis and was not able to keep any food or liquids down.  She presented to the emergency room for further evaluation.  Her laboratory workup showed a white blood cell count of 13.3 with hemoglobin of 15.3 and showing some hemoconcentration but otherwise normal renal function with creatinine 0.89 and normal electrolytes.  She had a CT scan of the abdomen pelvis which showed dilated loops of small bowel with a transition point in the left lower abdomen near midline.  I personally viewed the images and agree with the findings.  The patient has a surgical history of a laparoscopic tubal ligation on 12/03/2021.  Past Medical History: Past Medical History:  Diagnosis Date   Anxiety    Depression    GERD (gastroesophageal reflux disease)    Headache    stress   Heart murmur    History of cannabis abuse    Opioid use disorder in remission    in remission since 2019   Seizures [facility])    age 67. syncopal episode. Hit head. had seizure.    Tobacco abuse    Vitamin D deficiency      Past Surgical History: Past Surgical History:  Procedure Laterality Date   COLON SURGERY  2013   pt states colonoscopy while awake   COLONOSCOPY WITH PROPOFOL  N/A 12/14/2015   Procedure: COLONOSCOPY WITH PROPOFOL ;  Surgeon: Rogelia Copping, MD;  Location: [facility];  Service: Endoscopy;  Laterality: N/A;   ESOPHAGOGASTRODUODENOSCOPY (EGD) WITH PROPOFOL  N/A 12/14/2015   Procedure: ESOPHAGOGASTRODUODENOSCOPY (EGD) WITH PROPOFOL ;  Surgeon: Rogelia Copping, MD;  Location: [facility];  Service: Endoscopy;  Laterality: N/A;   ESOPHAGOGASTRODUODENOSCOPY (EGD) WITH PROPOFOL   N/A 02/21/2022   Procedure: ESOPHAGOGASTRODUODENOSCOPY (EGD) WITH BIOPSY;  Surgeon: Unk Corinn Skiff, MD;  Location: [facility];  Service: Endoscopy;  Laterality: N/A;   LAPAROSCOPIC TUBAL LIGATION Bilateral 12/03/2021   Procedure: LAPAROSCOPIC TUBAL LIGATION;  Surgeon: Janit Alm Agent, MD;  Location: [facility];  Service: Gynecology;  Laterality: Bilateral;   WISDOM TOOTH EXTRACTION  2013    Home Medications: Prior to Admission medications  Medication Sig Start Date End Date Taking? Authorizing Provider  SUBOXONE  8-2 MG FILM Place 1 Film under the tongue 2 (two) times daily. 07/16/24  Yes [provider]  WEGOVY 2.4 MG/0.75ML SOAJ SQ injection Inject 2.4 mg into the skin once a week. 04/07/24  Yes [provider]  buprenorphine -naloxone  (SUBOXONE ) 8-2 mg SUBL SL tablet Place 1 tablet under the tongue daily. Patient uses 8mg  film bid    [provider]  hydrocortisone  1 % lotion Apply 1 Application topically 2 (two) times daily. 08/07/22   Triplett, Kirk B, FNP  meclizine  (ANTIVERT ) 25 MG tablet Take 1 tablet (25 mg total) by mouth 3 (three) times daily as needed for dizziness. 03/06/22   Clide Burnard Ee, MD  pantoprazole  (PROTONIX ) 40 MG tablet Take 1 tablet by mouth daily. 01/26/22 02/25/22  [provider]  sertraline  (ZOLOFT ) 50 MG tablet Take 50 mg by mouth daily. 11/06/22   [provider]  valACYclovir  (VALTREX ) 500 MG tablet Take 1 tablet (500 mg total) by mouth daily. Can increase to twice a day for  5 days in the event of a recurrence 03/26/22   Janit Alm Agent, MD  Vitamin D, Ergocalciferol, (DRISDOL) 1.25 MG (50000 UNIT) CAPS capsule Take 50,000 Units by mouth once a week. 09/28/22   [provider]    Allergies: Allergies[1]  Social History:  reports that she has quit smoking. Her smoking use included cigarettes. She has a 8 pack-year smoking history. She has never used smokeless tobacco. She reports that she does not  currently use drugs after having used the following drugs: Marijuana. She reports that she does not drink alcohol.   Family History: Family History  Problem Relation Age of Onset   Depression Mother    Diabetes Maternal Grandfather    Congestive Heart Failure Maternal Grandfather    Diverticulitis Maternal Grandmother    Dementia Maternal Grandmother     Review of Systems: Review of Systems  Constitutional:  Negative for chills and fever.  HENT:  Negative for hearing loss.   Respiratory:  Negative for shortness of breath.   Cardiovascular:  Negative for chest pain.  Gastrointestinal:  Positive for abdominal pain, nausea and vomiting.  Genitourinary:  Negative for dysuria.  Musculoskeletal:  Negative for myalgias.  Skin:  Negative for rash.  Neurological:  Negative for dizziness.  Psychiatric/Behavioral:  Negative for depression.     Physical Exam BP 102/76   Pulse 63   Temp 98.5 F (36.9 C) (Oral)   Resp 20   Ht 5' 7 (1.702 m)   Wt 104.3 kg   SpO2 96%   BMI 36.02 kg/m  CONSTITUTIONAL: No acute distress HEENT:  Normocephalic, atraumatic, extraocular motion intact. NECK: Trachea is midline, and there is no jugular venous distension.  RESPIRATORY:  Normal respiratory effort without pathologic use of accessory muscles. CARDIOVASCULAR: Regular rhythm and rate. GI: The abdomen is soft, somewhat distended, currently nontender to palpation.  NG tube has just been placed and has gastric contents with some blood tinge in the tubing.  MUSCULOSKELETAL:  Normal muscle strength and tone in all four extremities.  No peripheral edema or cyanosis. SKIN: Skin turgor is normal. There are no pathologic skin lesions.  NEUROLOGIC:  Motor and sensation is grossly normal.  Cranial nerves are grossly intact. PSYCH:  Alert and oriented to person, place and time. Affect is normal.  Laboratory Analysis: Results for orders placed or performed during the hospital encounter of 08/01/24 (from the  past 24 hours)  CBC with Differential     Status: Abnormal   Collection Time: 08/01/24  7:47 AM  Result Value Ref Range   WBC 13.3 (H) 4.0 - 10.5 K/uL   RBC 4.54 3.87 - 5.11 MIL/uL   Hemoglobin 15.3 (H) 12.0 - 15.0 g/dL   HCT 54.7 63.9 - 53.9 %   MCV 99.6 80.0 - 100.0 fL   MCH 33.7 26.0 - 34.0 pg   MCHC 33.8 30.0 - 36.0 g/dL   RDW 87.9 88.4 - 84.4 %   Platelets 322 150 - 400 K/uL   nRBC 0.0 0.0 - 0.2 %   Neutrophils Relative % 90 %   Neutro Abs 12.0 (H) 1.7 - 7.7 K/uL   Lymphocytes Relative 7 %   Lymphs Abs 1.0 0.7 - 4.0 K/uL   Monocytes Relative 3 %   Monocytes Absolute 0.3 0.1 - 1.0 K/uL   Eosinophils Relative 0 %   Eosinophils Absolute 0.0 0.0 - 0.5 K/uL   Basophils Relative 0 %   Basophils Absolute 0.0 0.0 - 0.1 K/uL   Immature Granulocytes  0 %   Abs Immature Granulocytes 0.04 0.00 - 0.07 K/uL  Comprehensive metabolic panel     Status: Abnormal   Collection Time: 08/01/24  7:47 AM  Result Value Ref Range   Sodium 139 135 - 145 mmol/L   Potassium 4.0 3.5 - 5.1 mmol/L   Chloride 102 98 - 111 mmol/L   CO2 26 22 - 32 mmol/L   Glucose, Bld 140 (H) 70 - 99 mg/dL   BUN 12 6 - 20 mg/dL   Creatinine, Ser 9.10 0.44 - 1.00 mg/dL   Calcium 89.4 (H) 8.9 - 10.3 mg/dL   Total Protein 8.5 (H) 6.5 - 8.1 g/dL   Albumin 4.9 3.5 - 5.0 g/dL   AST 16 15 - 41 U/L   ALT 12 0 - 44 U/L   Alkaline Phosphatase 65 38 - 126 U/L   Total Bilirubin 0.7 0.0 - 1.2 mg/dL   GFR, Estimated >39 >39 mL/min   Anion gap 12 5 - 15  Lipase, blood     Status: None   Collection Time: 08/01/24  7:47 AM  Result Value Ref Range   Lipase 35 11 - 51 U/L  Urinalysis, Complete w Microscopic -Urine, Clean Catch     Status: Abnormal   Collection Time: 08/01/24  9:15 AM  Result Value Ref Range   Color, Urine YELLOW (A) YELLOW   APPearance CLOUDY (A) CLEAR   Specific Gravity, Urine 1.027 1.005 - 1.030   pH 7.0 5.0 - 8.0   Glucose, UA NEGATIVE NEGATIVE mg/dL   Hgb urine dipstick NEGATIVE NEGATIVE   Bilirubin  Urine NEGATIVE NEGATIVE   Ketones, ur NEGATIVE NEGATIVE mg/dL   Protein, ur NEGATIVE NEGATIVE mg/dL   Nitrite NEGATIVE NEGATIVE   Leukocytes,Ua NEGATIVE NEGATIVE   RBC / HPF 0-5 0 - 5 RBC/hpf   WBC, UA 0-5 0 - 5 WBC/hpf   Bacteria, UA RARE (A) NONE SEEN   Squamous Epithelial / HPF 21-50 0 - 5 /HPF   Mucus PRESENT   POC urine preg, ED (not at [facility])     Status: None   Collection Time: 08/01/24  9:36 AM  Result Value Ref Range   Preg Test, Ur NEGATIVE NEGATIVE    Imaging: DG Abdomen 1 View Result Date: 08/01/2024 EXAM: 1 VIEW XRAY OF THE ABDOMEN 08/01/2024 11:56:00 AM COMPARISON: None available. CLINICAL HISTORY: post NG placement FINDINGS: LINES, TUBES AND DEVICES: An enteric catheter is present with its tip in the fundus of the stomach in satisfactory position. BOWEL: Nonobstructive bowel gas pattern. SOFT TISSUES: There is iodinated contrast media within the renal collecting systems. No abnormal calcifications. BONES: No acute fracture. IMPRESSION: 1. Enteric catheter in satisfactory position with its tip in the fundus of the stomach. 2. Iodinated contrast media within the renal collecting systems. Electronically signed by: Evalene Coho MD 08/01/2024 12:01 PM EST RP Workstation: [id number]   CT ABDOMEN PELVIS W CONTRAST Result Date: 08/01/2024 EXAM: CT ABDOMEN AND PELVIS WITH CONTRAST 08/01/2024 09:50:33 AM TECHNIQUE: CT of the abdomen and pelvis was performed with the administration of 100 mL of iohexol  (OMNIPAQUE ) 300 MG/ML solution. Multiplanar reformatted images are provided for review. Automated exposure control, iterative reconstruction, and/or weight-based adjustment of the mA/kV was utilized to reduce the radiation dose to as low as reasonably achievable. COMPARISON: None available. CLINICAL HISTORY: periumbilical abdominal pain FINDINGS: LOWER CHEST: No acute abnormality. LIVER: The liver is unremarkable. GALLBLADDER AND BILE DUCTS: Gallbladder is unremarkable. No biliary ductal  dilatation. SPLEEN: No acute abnormality. PANCREAS: No  acute abnormality. ADRENAL GLANDS: No acute abnormality. KIDNEYS, URETERS AND BLADDER: No stones in the kidneys or ureters. No hydronephrosis. No perinephric or periureteral stranding. Urinary bladder is unremarkable. GI AND BOWEL: Stomach demonstrates no acute abnormality. Multuiple segments of mid small bowel dilatation with gas measuring up to 3.7 cm. Developing transition point within the left lower abdomen with associated fecalized material within the small bowel. No pneumatosis. PERITONEUM AND RETROPERITONEUM: Associated mesenteric free fluid. No mesenteric gas. No portal venous gas. No free air. VASCULATURE: Aorta is normal in caliber. LYMPH NODES: No lymphadenopathy. REPRODUCTIVE ORGANS: No acute abnormality. BONES AND SOFT TISSUES: No acute osseous abnormality. No focal soft tissue abnormality. IMPRESSION: 1. Multiple continuous segments of mid small bowel fluid dilatation with developing transition point in the left lower abdomen suggestive of small bowel obstruction. 2. Associated mesenteric free fluid, without mesenteric gas, portal venous gas, or pneumatosis. Electronically signed by: Morgane Naveau MD 08/01/2024 10:03 AM EST RP Workstation: [id number]    Assessment and Plan: This is a 34 y.o. female with small bowel obstruction.  - Discussed with patient the findings on her CT scan showing dilated loops of small bowel with small bowel obstruction.  Discussed with her that the most likely reason is adhesions that came as a result from her laparoscopic tubal ligation.  Discussed with the patient that usually these episodes resolve on their own with conservative management although sometimes patients do require surgery.  Discussed with patient that conservative management would include NG tube which has been placed just now with n.p.o. diet and IV fluid hydration.  With this, the goal is to decompress the bowel so that the small bowel  obstruction can resolve on its own.  Discussed with her the eventual plan for clamping the NG tube and advancing her diet once she has regained bowel function. - Will repeat KUB tomorrow. - Home medications have been ordered. - DVT and GI prophylaxis ordered. - Out of bed, ambulate as tolerated.  I spent 75 minutes dedicated to the care of this patient on the date of this encounter to include pre-visit review of records, face-to-face time with the patient discussing diagnosis and management, and any post-visit coordination of care.   Aloysius Sheree Plant, MD [facility] Pg:  [phone]      [1] No Known Allergies  "

## 2024-08-01 NOTE — ED Triage Notes (Signed)
 BIBA due abdominal pain for 12 hours.  Complains of N/V/D and upper abdominal pain.  She is ambulatory and AOx4.  EMS stated VS was WDL.

## 2024-08-02 ENCOUNTER — Inpatient Hospital Stay

## 2024-08-02 DIAGNOSIS — K56609 Unspecified intestinal obstruction, unspecified as to partial versus complete obstruction: Secondary | ICD-10-CM | POA: Diagnosis not present

## 2024-08-02 LAB — CBC
HCT: 36.9 % (ref 36.0–46.0)
Hemoglobin: 12.3 g/dL (ref 12.0–15.0)
MCH: 34 pg (ref 26.0–34.0)
MCHC: 33.3 g/dL (ref 30.0–36.0)
MCV: 101.9 fL — ABNORMAL HIGH (ref 80.0–100.0)
Platelets: 248 K/uL (ref 150–400)
RBC: 3.62 MIL/uL — ABNORMAL LOW (ref 3.87–5.11)
RDW: 12 % (ref 11.5–15.5)
WBC: 6.7 K/uL (ref 4.0–10.5)
nRBC: 0 % (ref 0.0–0.2)

## 2024-08-02 LAB — BASIC METABOLIC PANEL WITH GFR
Anion gap: 9 (ref 5–15)
BUN: 12 mg/dL (ref 6–20)
CO2: 27 mmol/L (ref 22–32)
Calcium: 9 mg/dL (ref 8.9–10.3)
Chloride: 104 mmol/L (ref 98–111)
Creatinine, Ser: 0.9 mg/dL (ref 0.44–1.00)
GFR, Estimated: >60 mL/min
Glucose, Bld: 96 mg/dL (ref 70–99)
Potassium: 3.5 mmol/L (ref 3.5–5.1)
Sodium: 140 mmol/L (ref 135–145)

## 2024-08-02 LAB — MAGNESIUM: Magnesium: 2.1 mg/dL (ref 1.7–2.4)

## 2024-08-02 LAB — HIV ANTIBODY (ROUTINE TESTING W REFLEX): HIV Screen 4th Generation wRfx: NONREACTIVE

## 2024-08-02 MED ORDER — PHENOL 1.4 % MT LIQD
1.0000 | OROMUCOSAL | Status: DC | PRN
  Administered 2024-08-02: 1 via OROMUCOSAL
  Filled 2024-08-02: qty 177

## 2024-08-02 NOTE — Plan of Care (Signed)

## 2024-08-02 NOTE — Plan of Care (Signed)

## 2024-08-02 NOTE — Progress Notes (Signed)
 [facility] SUR[facility]ICAL PROGRESS NOTE (cpt [phone])  Hospital Day(s): 1.   Interval History: Patient seen and examined, no acute events or new complaints overnight. Patient reports she is feeling better. No fever, chills, nausea, emesis. No significant abdominal pain. Labs this morning are reassuring. NGT output in last 24 hours is 200 ccs. KUB this AM with air throughout the colon. She has passed flatus.   Review of Systems:  Constitutional: denies fever, chills  HEENT: denies cough or congestion  Respiratory: denies any shortness of breath  Cardiovascular: denies chest pain or palpitations  Gastrointestinal: denies abdominal pain, N/V Genitourinary: denies burning with urination or urinary frequency Musculoskeletal: denies pain, decreased motor or sensation  Vital signs in last 24 hours: [min-max] current  Temp:  [98.3 F (36.8 C)-98.6 F (37 C)] 98.6 F (37 C) (01/05 0732) Pulse Rate:  [61-78] 61 (01/05 0732) Resp:  [17-20] 17 (01/05 0732) BP: (102-115)/(68-78) 115/75 (01/05 0732) SpO2:  [96 %-100 %] 99 % (01/05 0732)     Height: 5' 7 (170.2 cm) Weight: 104.3 kg BMI (Calculated): 36.01   Intake/Output last 2 shifts:  01/04 0701 - 01/05 0700 In: 2140 [P.O.:30; I.V.:1000; NG/GT:60; IV Piggyback:1050] Out: 1250 [Emesis/NG output:1250]   Physical Exam:  Constitutional: alert, cooperative and no distress  HENT: normocephalic without obvious abnormality; NGT in place (clamped) Eyes: PERRL, EOM's grossly intact and symmetric  Respiratory: breathing non-labored at rest  Cardiovascular: regular rate and sinus rhythm  Gastrointestinal: soft, non-tender, and non-distended, no rebound/guarding   Labs:     Latest Ref Rng & Units 08/02/2024    5:52 AM 08/01/2024    7:47 AM 01/14/2023   11:42 AM  CBC  WBC 4.0 - 10.5 K/uL 6.7  13.3  7.3   Hemoglobin 12.0 - 15.0 g/dL 87.6  84.6  86.6   Hematocrit 36.0 - 46.0 % 36.9  45.2  39.1   Platelets 150 - 400 K/uL 248  322  308        Latest Ref Rng & Units 08/02/2024    5:52 AM 08/01/2024    7:47 AM 01/14/2023   11:42 AM  CMP  Glucose 70 - 99 mg/dL 96  859  92   BUN 6 - 20 mg/dL 12  12  11    Creatinine 0.44 - 1.00 mg/dL 9.09  9.10  9.13   Sodium 135 - 145 mmol/L 140  139  140   Potassium 3.5 - 5.1 mmol/L 3.5  4.0  4.0   Chloride 98 - 111 mmol/L 104  102  106   CO2 22 - 32 mmol/L 27  26  20    Calcium 8.9 - 10.3 mg/dL 9.0  89.4  9.1   Total Protein 6.5 - 8.1 g/dL  8.5  7.3   Total Bilirubin 0.0 - 1.2 mg/dL  0.7  0.6   Alkaline Phos 38 - 126 U/L  65  70   AST 15 - 41 U/L  16  11   ALT 0 - 44 U/L  12  10      Imaging studies:   KUB (08/02/2024) personally reviewed with air throughout the colon, and radiologist report reviewed:  IMPRESSION: 1. NG tube tip is positioned in the mid stomach. 2. No gaseous small bowel dilatation to suggest bowel obstruction.   Assessment/Plan: (ICD-10's: K36.609) 34 y.o. female with clinically and radiographically resolving SBO   - Given improvement in KUB and passing flatus, we will proceed with 4 hours NGT clamping trial. If residuals after  4 hours are <150 ccs, we can remove this and start diet - Continue NPO for now pending NGT clamp trial   - No need for surgical intervention at this time   - Monitor abdominal examination; on-going bowel function    - Pain control prn; antiemetics prn  - Mobilize  All of the above findings and recommendations were discussed with the patient, and the medical team, and all of patient's questions were answered to her expressed satisfaction.  -- Arthea Platt, PA-C [facility] 08/02/2024, 8:24 AM M-F: 7am - 4pm

## 2024-08-03 DIAGNOSIS — R112 Nausea with vomiting, unspecified: Secondary | ICD-10-CM

## 2024-08-03 DIAGNOSIS — K56609 Unspecified intestinal obstruction, unspecified as to partial versus complete obstruction: Secondary | ICD-10-CM | POA: Diagnosis not present

## 2024-08-03 LAB — URINE DRUG SCREEN
Amphetamines: NEGATIVE
Barbiturates: NEGATIVE
Benzodiazepines: NEGATIVE
Cocaine: NEGATIVE
Fentanyl: NEGATIVE
Methadone Scn, Ur: NEGATIVE
Opiates: NEGATIVE
Tetrahydrocannabinol: POSITIVE — AB

## 2024-08-03 NOTE — Plan of Care (Signed)

## 2024-08-03 NOTE — Progress Notes (Signed)
 [facility] SUR[facility]ICAL PROGRESS NOTE (cpt [phone])  Hospital Day(s): 2.   Interval History: Patient seen and examined, no acute events or new complaints overnight. Patient reports she is doing okay this morning. She denied any abdominal pain, overt nausea, or emesis. No new labs this morning. NGT removed on 01/05 after passing clamp trial. She is on CLD; no issues. She continues to pass flatus. She did report a small, very watery, BM this AM.   Review of Systems:  Constitutional: denies fever, chills  HEENT: denies cough or congestion  Respiratory: denies any shortness of breath  Cardiovascular: denies chest pain or palpitations  Gastrointestinal: denies abdominal pain, N/V Genitourinary: denies burning with urination or urinary frequency Musculoskeletal: denies pain, decreased motor or sensation  Vital signs in last 24 hours: [min-max] current  Temp:  [98.5 F (36.9 C)-98.7 F (37.1 C)] 98.5 F (36.9 C) (01/06 0414) Pulse Rate:  [48-63] 62 (01/06 0414) Resp:  [16-17] 17 (01/06 0414) BP: (115-119)/(73-78) 119/74 (01/06 0414) SpO2:  [99 %-100 %] 100 % (01/06 0414)     Height: 5' 7 (170.2 cm) Weight: 104.3 kg BMI (Calculated): 36.01   Intake/Output last 2 shifts:  01/05 0701 - 01/06 0700 In: 810 [P.O.:810] Out: 25 [Emesis/NG output:25]   Physical Exam:  Constitutional: alert, cooperative and no distress  Eyes: PERRL, EOM's grossly intact and symmetric  Respiratory: breathing non-labored at rest  Cardiovascular: regular rate and sinus rhythm  Gastrointestinal: soft, non-tender, and non-distended, no rebound/guarding   Labs:     Latest Ref Rng & Units 08/02/2024    5:52 AM 08/01/2024    7:47 AM 01/14/2023   11:42 AM  CBC  WBC 4.0 - 10.5 K/uL 6.7  13.3  7.3   Hemoglobin 12.0 - 15.0 g/dL 87.6  84.6  86.6   Hematocrit 36.0 - 46.0 % 36.9  45.2  39.1   Platelets 150 - 400 K/uL 248  322  308       Latest Ref Rng & Units 08/02/2024    5:52 AM 08/01/2024    7:47 AM  01/14/2023   11:42 AM  CMP  Glucose 70 - 99 mg/dL 96  859  92   BUN 6 - 20 mg/dL 12  12  11    Creatinine 0.44 - 1.00 mg/dL 9.09  9.10  9.13   Sodium 135 - 145 mmol/L 140  139  140   Potassium 3.5 - 5.1 mmol/L 3.5  4.0  4.0   Chloride 98 - 111 mmol/L 104  102  106   CO2 22 - 32 mmol/L 27  26  20    Calcium 8.9 - 10.3 mg/dL 9.0  89.4  9.1   Total Protein 6.5 - 8.1 g/dL  8.5  7.3   Total Bilirubin 0.0 - 1.2 mg/dL  0.7  0.6   Alkaline Phos 38 - 126 U/L  65  70   AST 15 - 41 U/L  16  11   ALT 0 - 44 U/L  12  10      Imaging studies:  No new pertinent imaging studies    Assessment/Plan: (ICD-10's: K75.609) 34 y.o. female with clinically and radiographically resolving SBO   - Will advance to FLD this AM; If she does well, can do soft diet for dinner   - No need for surgical intervention at this time   - Family asking about repeat imaging; I do not think this is warranted at the present given her continued clinical progress   -  Monitor abdominal examination; on-going bowel function    - Pain control prn; antiemetics prn  - Mobilize   - Discharge Planning: She is doing well with return of bowel function. Will advance diet today. She wishes to see how she does today which is reasonable. We will reassess this afternoon and potentially DC home this afternoon vs tomorrow morning.   All of the above findings and recommendations were discussed with the patient, and the medical team, and all of patient's questions were answered to her expressed satisfaction.  -- Arthea Platt, PA-C [facility] 08/03/2024, 8:05 AM M-F: 7am - 4pm

## 2024-08-04 DIAGNOSIS — K56609 Unspecified intestinal obstruction, unspecified as to partial versus complete obstruction: Secondary | ICD-10-CM | POA: Diagnosis not present

## 2024-08-04 NOTE — Discharge Summary (Signed)
 [facility] SURGICAL DISCHARGE SUMMARY (cpt: [phone])  Patient ID: Kendra [name] MRN: [id number] DOB/AGE: 1991/02/04 33 y.o.  Admit date: 08/01/2024 Discharge date: 08/04/2024  Discharge Diagnoses Patient Active Problem List   Diagnosis Date Noted   Nausea and vomiting 08/03/2024   Small bowel obstruction (HCC) 08/01/2024    Consultants None  Procedures None  HPI: Kendra [name] is a 34 y.o. female presenting for evaluation of abdominal pain associated with nausea and vomiting.  Patient reports the pain started yesterday and continued to progress and get worse.  She has had multiple episodes of emesis and was not able to keep any food or liquids down.  She presented to the emergency room for further evaluation.  Her laboratory workup showed a white blood cell count of 13.3 with hemoglobin of 15.3 and showing some hemoconcentration but otherwise normal renal function with creatinine 0.89 and normal electrolytes.  She had a CT scan of the abdomen pelvis which showed dilated loops of small bowel with a transition point in the left lower abdomen near midline.  I personally viewed the images and agree with the findings.   Hospital Course: Patient was admitted to the general surgery service. She did undergo NGT decompression. On hospital day 1, she had significant improvement in her KUB and passed NGT clamping trial. Advancement of patient's diet and ambulation were well-tolerated. The remainder of patient's hospital course was essentially unremarkable, and discharge planning was initiated accordingly with patient safely able to be discharged home with appropriate discharge instruction, pain control, and outpatient follow-up after all of her questions were answered to her expressed satisfaction.   Discharge Condition: Good   Physical Examination:  Constitutional: alert, cooperative and no distress  Eyes: PERRL, EOM's grossly intact and symmetric  Respiratory: breathing  non-labored at rest  Cardiovascular: regular rate and sinus rhythm  Gastrointestinal: soft, non-tender, and non-distended, no rebound/guarding   Allergies as of 08/04/2024   No Known Allergies      Medication List     TAKE these medications    buprenorphine -naloxone  8-2 mg Subl SL tablet Commonly known as: SUBOXONE  Place 1 tablet under the tongue daily. Patient uses 8mg  film bid   Suboxone  8-2 MG Film Generic drug: Buprenorphine  HCl-Naloxone  HCl Place 1 Film under the tongue 2 (two) times daily.   hydrocortisone  1 % lotion Apply 1 Application topically 2 (two) times daily.   meclizine  25 MG tablet Commonly known as: ANTIVERT  Take 1 tablet (25 mg total) by mouth 3 (three) times daily as needed for dizziness.   pantoprazole  40 MG tablet Commonly known as: PROTONIX  Take 1 tablet by mouth daily.   sertraline  50 MG tablet Commonly known as: ZOLOFT  Take 50 mg by mouth daily.   valACYclovir  500 MG tablet Commonly known as: VALTREX  Take 1 tablet (500 mg total) by mouth daily. Can increase to twice a day for 5 days in the event of a recurrence   Vitamin D (Ergocalciferol) 1.25 MG (50000 UNIT) Caps capsule Commonly known as: DRISDOL Take 50,000 Units by mouth once a week.   Wegovy 2.4 MG/0.75ML Soaj SQ injection Generic drug: semaglutide-weight management Inject 2.4 mg into the skin once a week.          Follow-up Information     [facility]. Call.   Specialty: General Surgery Why: As needed Contact information: [address] [place]  [phone]  Time spent on discharge management including discussion of hospital course, clinical condition, outpatient instructions, prescriptions, and follow up with the patient and members of the medical team: >30 minutes  -- Arthea Platt , PA-C [facility]  08/04/2024, 1:11 PM [phone] M-F: 7am -  4pm

## 2024-08-09 ENCOUNTER — Encounter: Payer: Self-pay | Admitting: Surgery

## 2024-08-09 ENCOUNTER — Ambulatory Visit: Admitting: Surgery

## 2024-08-09 VITALS — BP 132/84 | HR 81 | Temp 98.7°F | Ht 67.0 in | Wt 235.4 lb

## 2024-08-09 DIAGNOSIS — K56609 Unspecified intestinal obstruction, unspecified as to partial versus complete obstruction: Secondary | ICD-10-CM

## 2024-08-09 DIAGNOSIS — Z09 Encounter for follow-up examination after completed treatment for conditions other than malignant neoplasm: Secondary | ICD-10-CM

## 2024-08-09 NOTE — Progress Notes (Signed)
 " 08/09/2024  History of Present Illness: Kendra [name] is a 34 y.o. female presenting for follow-up of small bowel obstruction.  She was admitted on 08/01/2024 with an episode of small bowel obstruction.  She was managed conservatively with NG tube, and n.p.o. and fluids.  She was eventually discharged on 08/04/2024 with bowel function and tolerating a diet.  Patient reports today that she is still having some bloatedness in the lower abdomen and also has noticed that her stools are hard.  She continues having bowel movements and continues passing gas.  Denies any nausea or vomiting.  Past Medical History: Past Medical History:  Diagnosis Date   Anxiety    Depression    GERD (gastroesophageal reflux disease)    Headache    stress   Heart murmur    History of cannabis abuse    Opioid use disorder in remission    in remission since 2019   Seizures [facility])    age 45. syncopal episode. Hit head. had seizure.    Tobacco abuse    Vitamin D deficiency      Past Surgical History: Past Surgical History:  Procedure Laterality Date   COLON SURGERY  2013   pt states colonoscopy while awake   COLONOSCOPY WITH PROPOFOL  N/A 12/14/2015   Procedure: COLONOSCOPY WITH PROPOFOL ;  Surgeon: Rogelia Copping, MD;  Location: [facility];  Service: Endoscopy;  Laterality: N/A;   ESOPHAGOGASTRODUODENOSCOPY (EGD) WITH PROPOFOL  N/A 12/14/2015   Procedure: ESOPHAGOGASTRODUODENOSCOPY (EGD) WITH PROPOFOL ;  Surgeon: Rogelia Copping, MD;  Location: [facility];  Service: Endoscopy;  Laterality: N/A;   ESOPHAGOGASTRODUODENOSCOPY (EGD) WITH PROPOFOL  N/A 02/21/2022   Procedure: ESOPHAGOGASTRODUODENOSCOPY (EGD) WITH BIOPSY;  Surgeon: Unk Corinn Skiff, MD;  Location: [facility];  Service: Endoscopy;  Laterality: N/A;   LAPAROSCOPIC TUBAL LIGATION Bilateral 12/03/2021   Procedure: LAPAROSCOPIC TUBAL LIGATION;  Surgeon: Janit Alm Agent, MD;  Location: [facility];  Service: Gynecology;  Laterality:  Bilateral;   WISDOM TOOTH EXTRACTION  2013    Home Medications: Prior to Admission medications  Medication Sig Start Date End Date Taking? Authorizing Provider  buprenorphine -naloxone  (SUBOXONE ) 8-2 mg SUBL SL tablet Place 1 tablet under the tongue daily. Patient uses 8mg  film bid   Yes [provider]  sertraline  (ZOLOFT ) 50 MG tablet Take 50 mg by mouth daily. 11/06/22  Yes [provider]  SUBOXONE  8-2 MG FILM Place 1 Film under the tongue 2 (two) times daily. 07/16/24  Yes [provider]  valACYclovir  (VALTREX ) 500 MG tablet Take 1 tablet (500 mg total) by mouth daily. Can increase to twice a day for 5 days in the event of a recurrence 03/26/22  Yes Janit Alm Agent, MD    Allergies: Allergies[1]  Review of Systems: Review of Systems  Constitutional:  Negative for chills and fever.  Respiratory:  Negative for shortness of breath.   Cardiovascular:  Negative for chest pain.  Gastrointestinal:  Positive for constipation. Negative for nausea and vomiting.       Bloatedness    Physical Exam BP 132/84   Pulse 81   Temp 98.7 F (37.1 C) (Oral)   Ht 5' 7 (1.702 m)   Wt 235 lb 6.4 oz (106.8 kg)   SpO2 95%   BMI 36.87 kg/m  CONSTITUTIONAL: No acute distress HEENT:  Normocephalic, atraumatic, extraocular motion intact. RESPIRATORY:  Lungs are clear, and breath sounds are equal bilaterally. Normal respiratory effort without pathologic use of accessory muscles. CARDIOVASCULAR: Heart is regular without murmurs, gallops,  or rubs. GI: The abdomen is soft, nondistended, nontender to palpation. NEUROLOGIC:  Motor and sensation is grossly normal.  Cranial nerves are grossly intact. PSYCH:  Alert and oriented to person, place and time. Affect is normal.   Assessment and Plan: This is a 34 y.o. female with recent history of small bowel obstruction.  - Discussed with patient that since she is still having bowel function having a bowel movement, flatus,  tolerating a diet, without any nausea or vomiting, I do not think there is any reason right now to do further imaging.  While she is having bowel function there is less concern for developing bowel obstruction.  Discussed with the patient that her hard stool may be related to leftover stool from her obstruction episode and the stone is need to coming out.  Recommend that she increase her fluid intake and also can take MiraLAX  or stool softeners to help with constipation.  This should resolve on its own as the bowels reset themselves better.  This may also be contributing to the bloatedness that she feels.  However while she remains with bowel function, there is less concern for recurrent SBO. - Follow-up as needed.  I spent 10 minutes dedicated to the care of this patient on the date of this encounter to include pre-visit review of records, face-to-face time with the patient discussing diagnosis and management, and any post-visit coordination of care.   Aloysius Sheree Plant, MD [facility]        [1] No Known Allergies  "

## 2024-08-09 NOTE — Patient Instructions (Signed)
 You may take Miralax  daily to help with constipation.   Soft-Food Diet A soft-food diet includes foods that are safe and easy to chew and swallow. Your health care provider or dietitian can help you find foods and flavors that fit into this diet. Follow this diet until your provider or dietitian says it's safe to start eating other foods and food textures. What are tips for following this plan? Cooking Cook meats so they stay tender and moist. Use methods like braising, stewing, or baking in liquid. Cook vegetables and fruit until they're soft enough to be mashed with a fork. Peel soft, fresh fruits such as peaches, nectarines, and melons. When making soup, make sure chunks of meat and vegetables are smaller than  inch (1.27 cm). Reheat leftover foods slowly so that a tough crust doesn't form. General information  Take small bites of food or cut food into pieces about  inch (1.27 cm) or smaller. Bite-sized pieces of food are easier to chew and swallow. Eat moist foods. Avoid overly dry foods. Avoid foods that: Are difficult to swallow, such as dry, chunky, crispy, or sticky foods. Are difficult to chew, such as hard, tough, or stringy foods. Contain nuts, seeds, or many types of uncooked fruit. Ask your provider whether you need to take a fiber supplement. Continue to eat a variety of foods from each category, including fruits, vegetables, dairy, grains, and protein. This will help make sure you get adequate nutrition. What foods should I eat? Fruits All canned and cooked fruits. Soft, peeled fresh fruits. Strawberries. Vegetables All soft-cooked vegetables. Grains Breads, muffins, pancakes, or waffles moistened with syrup, jelly, or butter. Dry cereals well-moistened with milk. Moist, cooked cereals. Well-cooked pasta and rice. Meats and other proteins Tender, moist ground meat, poultry, or fish. Meat cooked in gravy or sauces. Poached or scrambled eggs. Tofu. Baked beans. Smooth  peanut butter. Dairy Milk. Cream. Yogurt. Cottage cheese. Soft cheese without the rind. Sweets and desserts Ice cream. Milk shakes. Sherbet. Pudding. Fats and oils Butter. Margarine. Olive, canola, sunflower, and grapeseed oil. Smooth salad dressing. Smooth cream cheese. Mayonnaise. Gravy. The items listed above may not be all the foods and drinks you can have. Talk with a dietitian to learn more. What foods should I avoid? Fruits Fresh fruits with skins or seeds, or both, such as apples, pears, and grapes. Stringy, high-pulp fruits, such as papaya, pineapple, coconut, and mango. Fruit leather and all dried fruit. Vegetables All raw vegetables. Cooked corn. Cooked vegetables that are tough or stringy. Tough, crisp, fried potatoes and potato skins. Grains Coarse or dry cereals, such as bran, granola, and shredded wheat. Tough or chewy crusty breads, such as French bread or baguettes. Breads with nuts, seeds, or fruit. Meats and other proteins Hard, dry sausages. Dry meat, poultry, or fish. Meats with gristle. Fish with bones. Fried meat or fish. Lunch meat and hotdogs. Nuts and seeds. Chunky peanut butter or other nut butters. Dairy Yogurt with nuts or coconut. Sweets and desserts Cakes or cookies that are very dry or chewy. Desserts with dried fruit, nuts, or coconut. Fried pastries. Very rich pastries. Fats and oils Cream cheese with fruit or nuts. Salad dressings with seeds or chunks. The items listed above may not be all the foods and drinks you should avoid. Talk with a dietitian to learn more. This information is not intended to replace advice given to you by your health care provider. Make sure you discuss any questions you have with your health care provider.  Document Revised: 04/22/2023 Document Reviewed: 04/22/2023 Elsevier Patient Education  2025 [employer].
